# Patient Record
Sex: Male | Born: 1955 | Race: White | Marital: Married | Smoking: Former smoker
Health system: Southern US, Community
[De-identification: ages and names within clinical notes are randomized; demographics above are authoritative.]

## PROBLEM LIST (undated history)

## (undated) DIAGNOSIS — I25110 Atherosclerotic heart disease of native coronary artery with unstable angina pectoris: Secondary | ICD-10-CM

## (undated) DIAGNOSIS — E785 Hyperlipidemia, unspecified: Secondary | ICD-10-CM

## (undated) DIAGNOSIS — I480 Paroxysmal atrial fibrillation: Secondary | ICD-10-CM

## (undated) DIAGNOSIS — Z720 Tobacco use: Secondary | ICD-10-CM

## (undated) DIAGNOSIS — I5023 Acute on chronic systolic (congestive) heart failure: Secondary | ICD-10-CM

## (undated) DIAGNOSIS — R0600 Dyspnea, unspecified: Secondary | ICD-10-CM

## (undated) DIAGNOSIS — I509 Heart failure, unspecified: Secondary | ICD-10-CM

## (undated) DIAGNOSIS — I499 Cardiac arrhythmia, unspecified: Secondary | ICD-10-CM

## (undated) DIAGNOSIS — I209 Angina pectoris, unspecified: Secondary | ICD-10-CM

## (undated) DIAGNOSIS — I2510 Atherosclerotic heart disease of native coronary artery without angina pectoris: Secondary | ICD-10-CM

## (undated) DIAGNOSIS — E119 Type 2 diabetes mellitus without complications: Secondary | ICD-10-CM

## (undated) DIAGNOSIS — I255 Ischemic cardiomyopathy: Secondary | ICD-10-CM

## (undated) DIAGNOSIS — I219 Acute myocardial infarction, unspecified: Secondary | ICD-10-CM

---

## 2016-04-01 DIAGNOSIS — Z736 Limitation of activities due to disability: Secondary | ICD-10-CM

## 2017-03-29 ENCOUNTER — Encounter (HOSPITAL_COMMUNITY)
Admission: EM | Disposition: A | Discharge status: Home / Self Care | Payer: Self-pay | Source: Home / Self Care | Attending: Thoracic Surgery (Cardiothoracic Vascular Surgery)

## 2017-03-29 ENCOUNTER — Emergency Department (HOSPITAL_COMMUNITY): Payer: 59

## 2017-03-29 ENCOUNTER — Inpatient Hospital Stay (HOSPITAL_COMMUNITY)
Admission: EM | Admit: 2017-03-29 | Discharge: 2017-04-09 | DRG: 233 | Disposition: A | Discharge status: Home / Self Care | Payer: 59 | Attending: Thoracic Surgery (Cardiothoracic Vascular Surgery) | Admitting: Thoracic Surgery (Cardiothoracic Vascular Surgery)

## 2017-03-29 DIAGNOSIS — E119 Type 2 diabetes mellitus without complications: Secondary | ICD-10-CM

## 2017-03-29 DIAGNOSIS — I480 Paroxysmal atrial fibrillation: Secondary | ICD-10-CM | POA: Diagnosis present

## 2017-03-29 DIAGNOSIS — I214 Non-ST elevation (NSTEMI) myocardial infarction: Secondary | ICD-10-CM | POA: Diagnosis not present

## 2017-03-29 DIAGNOSIS — D62 Acute posthemorrhagic anemia: Secondary | ICD-10-CM | POA: Diagnosis not present

## 2017-03-29 DIAGNOSIS — Z951 Presence of aortocoronary bypass graft: Secondary | ICD-10-CM

## 2017-03-29 DIAGNOSIS — I447 Left bundle-branch block, unspecified: Secondary | ICD-10-CM | POA: Diagnosis present

## 2017-03-29 DIAGNOSIS — J9811 Atelectasis: Secondary | ICD-10-CM

## 2017-03-29 DIAGNOSIS — R7303 Prediabetes: Secondary | ICD-10-CM | POA: Diagnosis present

## 2017-03-29 DIAGNOSIS — I4891 Unspecified atrial fibrillation: Secondary | ICD-10-CM

## 2017-03-29 DIAGNOSIS — I5023 Acute on chronic systolic (congestive) heart failure: Secondary | ICD-10-CM | POA: Diagnosis present

## 2017-03-29 DIAGNOSIS — Z8249 Family history of ischemic heart disease and other diseases of the circulatory system: Secondary | ICD-10-CM

## 2017-03-29 DIAGNOSIS — D696 Thrombocytopenia, unspecified: Secondary | ICD-10-CM | POA: Diagnosis present

## 2017-03-29 DIAGNOSIS — Z6833 Body mass index (BMI) 33.0-33.9, adult: Secondary | ICD-10-CM

## 2017-03-29 DIAGNOSIS — R0602 Shortness of breath: Secondary | ICD-10-CM

## 2017-03-29 DIAGNOSIS — J449 Chronic obstructive pulmonary disease, unspecified: Secondary | ICD-10-CM | POA: Diagnosis present

## 2017-03-29 DIAGNOSIS — Z4682 Encounter for fitting and adjustment of non-vascular catheter: Secondary | ICD-10-CM

## 2017-03-29 DIAGNOSIS — R42 Dizziness and giddiness: Secondary | ICD-10-CM | POA: Diagnosis not present

## 2017-03-29 DIAGNOSIS — I509 Heart failure, unspecified: Secondary | ICD-10-CM

## 2017-03-29 DIAGNOSIS — R570 Cardiogenic shock: Secondary | ICD-10-CM | POA: Diagnosis present

## 2017-03-29 DIAGNOSIS — I25110 Atherosclerotic heart disease of native coronary artery with unstable angina pectoris: Secondary | ICD-10-CM | POA: Diagnosis present

## 2017-03-29 DIAGNOSIS — F17210 Nicotine dependence, cigarettes, uncomplicated: Secondary | ICD-10-CM | POA: Diagnosis present

## 2017-03-29 DIAGNOSIS — I959 Hypotension, unspecified: Secondary | ICD-10-CM | POA: Diagnosis present

## 2017-03-29 DIAGNOSIS — Z716 Tobacco abuse counseling: Secondary | ICD-10-CM

## 2017-03-29 DIAGNOSIS — J9601 Acute respiratory failure with hypoxia: Secondary | ICD-10-CM | POA: Diagnosis present

## 2017-03-29 DIAGNOSIS — E785 Hyperlipidemia, unspecified: Secondary | ICD-10-CM | POA: Diagnosis present

## 2017-03-29 DIAGNOSIS — I255 Ischemic cardiomyopathy: Secondary | ICD-10-CM | POA: Diagnosis present

## 2017-03-29 DIAGNOSIS — Z720 Tobacco use: Secondary | ICD-10-CM | POA: Diagnosis present

## 2017-03-29 DIAGNOSIS — E669 Obesity, unspecified: Secondary | ICD-10-CM | POA: Diagnosis present

## 2017-03-29 HISTORY — DX: Tobacco use: Z72.0

## 2017-03-29 HISTORY — PX: CORONARY/GRAFT ACUTE MI REVASCULARIZATION: CATH118305

## 2017-03-29 HISTORY — DX: Dyspnea, unspecified: R06.00

## 2017-03-29 HISTORY — DX: Paroxysmal atrial fibrillation: I48.0

## 2017-03-29 HISTORY — DX: Hyperlipidemia, unspecified: E78.5

## 2017-03-29 HISTORY — PX: IABP INSERTION: CATH118242

## 2017-03-29 HISTORY — DX: Ischemic cardiomyopathy: I25.5

## 2017-03-29 HISTORY — DX: Acute on chronic systolic (congestive) heart failure: I50.23

## 2017-03-29 HISTORY — DX: Acute myocardial infarction, unspecified: I21.9

## 2017-03-29 HISTORY — DX: Atherosclerotic heart disease of native coronary artery without angina pectoris: I25.10

## 2017-03-29 HISTORY — PX: LEFT HEART CATH AND CORONARY ANGIOGRAPHY: CATH118249

## 2017-03-29 HISTORY — DX: Type 2 diabetes mellitus without complications: E11.9

## 2017-03-29 HISTORY — DX: Atherosclerotic heart disease of native coronary artery with unstable angina pectoris: I25.110

## 2017-03-29 HISTORY — DX: Cardiac arrhythmia, unspecified: I49.9

## 2017-03-29 HISTORY — DX: Angina pectoris, unspecified: I20.9

## 2017-03-29 LAB — LIPID PANEL
Cholesterol: 277 mg/dL — ABNORMAL HIGH (ref 0–200)
HDL: 37 mg/dL — AB (ref >40)
LDL Cholesterol: 180 mg/dL — ABNORMAL HIGH (ref 0–99)
Total CHOL/HDL Ratio: 7.5 RATIO
Triglycerides: 299 mg/dL — ABNORMAL HIGH (ref <150)
VLDL: 60 mg/dL — ABNORMAL HIGH (ref 0–40)

## 2017-03-29 LAB — BASIC METABOLIC PANEL
Anion gap: 13 (ref 5–15)
BUN: 14 mg/dL (ref 6–20)
CO2: 22 mmol/L (ref 22–32)
CREATININE: 1.02 mg/dL (ref 0.61–1.24)
Calcium: 9.4 mg/dL (ref 8.9–10.3)
Chloride: 99 mmol/L — ABNORMAL LOW (ref 101–111)
GFR calc Af Amer: >60 mL/min (ref >60)
GFR calc non Af Amer: >60 mL/min (ref >60)
GLUCOSE: 258 mg/dL — AB (ref 65–99)
Potassium: 5.2 mmol/L — ABNORMAL HIGH (ref 3.5–5.1)
Sodium: 134 mmol/L — ABNORMAL LOW (ref 135–145)

## 2017-03-29 LAB — CBC WITH DIFFERENTIAL/PLATELET
Basophils Absolute: 0.1 10*3/uL (ref 0.0–0.1)
Basophils Relative: 0 %
Eosinophils Absolute: 0.1 10*3/uL (ref 0.0–0.7)
Eosinophils Relative: 1 %
HEMATOCRIT: 51.5 % (ref 39.0–52.0)
Hemoglobin: 17.8 g/dL — ABNORMAL HIGH (ref 13.0–17.0)
LYMPHS PCT: 17 %
Lymphs Abs: 2.8 10*3/uL (ref 0.7–4.0)
MCH: 33.6 pg (ref 26.0–34.0)
MCHC: 34.6 g/dL (ref 30.0–36.0)
MCV: 97.4 fL (ref 78.0–100.0)
MONO ABS: 0.6 10*3/uL (ref 0.1–1.0)
MONOS PCT: 3 %
NEUTROS ABS: 13.6 10*3/uL — AB (ref 1.7–7.7)
Neutrophils Relative %: 79 %
Platelets: 190 10*3/uL (ref 150–400)
RBC: 5.29 MIL/uL (ref 4.22–5.81)
RDW: 13.3 % (ref 11.5–15.5)
WBC: 17.1 10*3/uL — ABNORMAL HIGH (ref 4.0–10.5)

## 2017-03-29 LAB — I-STAT CHEM 8, ED
BUN: 18 mg/dL (ref 6–20)
CHLORIDE: 101 mmol/L (ref 101–111)
CREATININE: 0.9 mg/dL (ref 0.61–1.24)
Calcium, Ion: 1.13 mmol/L — ABNORMAL LOW (ref 1.15–1.40)
GLUCOSE: 268 mg/dL — AB (ref 65–99)
HCT: 55 % — ABNORMAL HIGH (ref 39.0–52.0)
Hemoglobin: 18.7 g/dL — ABNORMAL HIGH (ref 13.0–17.0)
POTASSIUM: 5.2 mmol/L — AB (ref 3.5–5.1)
Sodium: 138 mmol/L (ref 135–145)
TCO2: 26 mmol/L (ref 22–32)

## 2017-03-29 LAB — I-STAT TROPONIN, ED: Troponin i, poc: 1.43 ng/mL (ref 0.00–0.08)

## 2017-03-29 LAB — APTT: APTT: 29 s (ref 24–36)

## 2017-03-29 LAB — PROTIME-INR
INR: 1.03
PROTHROMBIN TIME: 13.4 s (ref 11.4–15.2)

## 2017-03-29 LAB — TROPONIN I: Troponin I: 1.35 ng/mL (ref <0.03)

## 2017-03-29 LAB — MAGNESIUM: MAGNESIUM: 2 mg/dL (ref 1.7–2.4)

## 2017-03-29 LAB — TSH: TSH: 4.454 u[IU]/mL (ref 0.350–4.500)

## 2017-03-29 SURGERY — CORONARY/GRAFT ACUTE MI REVASCULARIZATION
Anesthesia: LOCAL

## 2017-03-29 MED ORDER — HEPARIN (PORCINE) IN NACL 2-0.9 UNIT/ML-% IJ SOLN
INTRAMUSCULAR | Status: DC | PRN
  Administered 2017-03-29: 1000 mL

## 2017-03-29 MED ORDER — DILTIAZEM LOAD VIA INFUSION
20.0000 mg | Freq: Once | INTRAVENOUS | Status: AC
  Administered 2017-03-29: 20 mg via INTRAVENOUS
  Filled 2017-03-29: qty 20

## 2017-03-29 MED ORDER — LEVALBUTEROL HCL 0.63 MG/3ML IN NEBU
0.6300 mg | INHALATION_SOLUTION | Freq: Once | RESPIRATORY_TRACT | Status: AC
  Administered 2017-03-29: 0.63 mg via RESPIRATORY_TRACT
  Filled 2017-03-29: qty 3

## 2017-03-29 MED ORDER — VERAPAMIL HCL 2.5 MG/ML IV SOLN
INTRAVENOUS | Status: AC
  Filled 2017-03-29: qty 2

## 2017-03-29 MED ORDER — HEPARIN (PORCINE) IN NACL 100-0.45 UNIT/ML-% IJ SOLN
1400.0000 [IU]/h | INTRAMUSCULAR | Status: DC
  Administered 2017-03-29: 1400 [IU]/h via INTRAVENOUS
  Filled 2017-03-29: qty 250

## 2017-03-29 MED ORDER — NITROGLYCERIN IN D5W 200-5 MCG/ML-% IV SOLN
0.0000 ug/min | INTRAVENOUS | Status: DC
  Administered 2017-03-29: 5 ug/min via INTRAVENOUS
  Filled 2017-03-29: qty 250

## 2017-03-29 MED ORDER — ASPIRIN 81 MG PO CHEW
CHEWABLE_TABLET | ORAL | Status: AC
  Filled 2017-03-29: qty 3

## 2017-03-29 MED ORDER — IOPAMIDOL (ISOVUE-370) INJECTION 76%
INTRAVENOUS | Status: AC
  Filled 2017-03-29: qty 125

## 2017-03-29 MED ORDER — LIDOCAINE HCL (PF) 1 % IJ SOLN
INTRAMUSCULAR | Status: AC
  Filled 2017-03-29: qty 30

## 2017-03-29 MED ORDER — HEPARIN (PORCINE) IN NACL 2-0.9 UNIT/ML-% IJ SOLN
INTRAMUSCULAR | Status: AC
  Filled 2017-03-29: qty 1000

## 2017-03-29 MED ORDER — HEPARIN BOLUS VIA INFUSION
4500.0000 [IU] | Freq: Once | INTRAVENOUS | Status: AC
  Administered 2017-03-29: 4500 [IU] via INTRAVENOUS
  Filled 2017-03-29: qty 4500

## 2017-03-29 MED ORDER — MORPHINE SULFATE (PF) 4 MG/ML IV SOLN
4.0000 mg | Freq: Once | INTRAVENOUS | Status: AC
  Administered 2017-03-29: 4 mg via INTRAVENOUS
  Filled 2017-03-29: qty 1

## 2017-03-29 MED ORDER — HEPARIN SODIUM (PORCINE) 1000 UNIT/ML IJ SOLN
INTRAMUSCULAR | Status: AC
Start: 2017-03-29 — End: ?
  Filled 2017-03-29: qty 1

## 2017-03-29 MED ORDER — DILTIAZEM HCL-DEXTROSE 100-5 MG/100ML-% IV SOLN (PREMIX)
5.0000 mg/h | INTRAVENOUS | Status: DC
  Administered 2017-03-29: 5 mg/h via INTRAVENOUS
  Filled 2017-03-29: qty 100

## 2017-03-29 MED ORDER — SODIUM CHLORIDE 0.9 % IV SOLN
INTRAVENOUS | Status: DC
  Administered 2017-03-29: 20 mL via INTRAVENOUS

## 2017-03-29 SURGICAL SUPPLY — 14 items
BALLN IABP SENSA PLUS 7.5F 40C (BALLOONS) ×2
BALLOON IABP SENS PLUS 7.5F40C (BALLOONS) ×1 IMPLANT
CATH 5FR JL3.5 JR4 ANG PIG MP (CATHETERS) ×2 IMPLANT
CATH VISTA GUIDE 6FR XBLAD3.5 (CATHETERS) ×2 IMPLANT
DEVICE RAD COMP TR BAND LRG (VASCULAR PRODUCTS) ×2 IMPLANT
GLIDESHEATH SLEND SS 6F .021 (SHEATH) ×2 IMPLANT
GUIDEWIRE INQWIRE 1.5J.035X260 (WIRE) ×1 IMPLANT
INQWIRE 1.5J .035X260CM (WIRE) ×2
KIT ENCORE 26 ADVANTAGE (KITS) ×2 IMPLANT
KIT HEART LEFT (KITS) ×2 IMPLANT
PACK CARDIAC CATHETERIZATION (CUSTOM PROCEDURE TRAY) ×2 IMPLANT
SHEATH PINNACLE 6F 10CM (SHEATH) ×2 IMPLANT
TRANSDUCER W/STOPCOCK (MISCELLANEOUS) ×2 IMPLANT
TUBING CIL FLEX 10 FLL-RA (TUBING) ×2 IMPLANT

## 2017-03-29 NOTE — ED Triage Notes (Signed)
[facility] 8[facility]10 CP that started around 1830 tonight while pt is at work. Pt describes chest pressure that radiates down left arm. Pt endorses SOB, nausea, and diaphoresis. Given x2 nitro and 81 mg aspirin with EMS. Pt reports decrease in CP following first dose of nitro. Pt in uncontrolled afib, HR anywhere from 90-170. Pt endorses increased CP with exertion.

## 2017-03-29 NOTE — ED Notes (Signed)
Third EKG done.

## 2017-03-29 NOTE — ED Provider Notes (Signed)
[facility] Provider Note   CSN: [id number] Arrival date & time: 03/29/17  2123     History   Chief Complaint Chief Complaint  Patient presents with  . Chest Pain    HPI Ryan Durham is a 62 y.o. male.  Patient with no reported PMH presents to the ED with a chief complaint of chest pain.  He states that the symptoms started at 6:30 PM.  He reports associated SOB and nausea.  Reports dry cough.  He states that he has been feeling run down for the past several weeks.  States that he felt something like this about 3 years ago and took some TUMS and didn't investigate it further.  He has taken some ASA and nitro by EMS with minimal relief.  There are no other associated symptoms.   The history is provided by the patient. No language interpreter was used.    No past medical history on file.  There are no active problems to display for this patient.    Home Medications    Prior to Admission medications   Not on File    Family History No family history on file.  Social History Social History   Tobacco Use  . Smoking status: Not on file  Substance Use Topics  . Alcohol use: Not on file  . Drug use: Not on file     Allergies   Patient has no allergy information on record.   Review of Systems Review of Systems  All other systems reviewed and are negative.    Physical Exam Updated Vital Signs BP (!) 137/103   Pulse 78   Temp 98.1 F (36.7 C) (Oral)   Resp 17   SpO2 (!) 89%   Physical Exam  Constitutional: He is oriented to person, place, and time. He appears well-developed and well-nourished.  HENT:  Head: Normocephalic and atraumatic.  Eyes: Conjunctivae and EOM are normal. Pupils are equal, round, and reactive to light. Right eye exhibits no discharge. Left eye exhibits no discharge. No scleral icterus.  Neck: Normal range of motion. Neck supple. No JVD present.  Cardiovascular: Normal heart sounds. An  irregularly irregular rhythm present. Tachycardia present. Exam reveals no gallop and no friction rub.  No murmur heard. Pulmonary/Chest: Effort normal. No respiratory distress. He has wheezes (diffusely). He has no rales. He exhibits no tenderness.  Abdominal: Soft. He exhibits no distension and no mass. There is no tenderness. There is no rebound and no guarding.  Musculoskeletal: Normal range of motion. He exhibits no edema or tenderness.  Neurological: He is alert and oriented to person, place, and time.  Skin: Skin is warm and dry.  Psychiatric: He has a normal mood and affect. His behavior is normal. Judgment and thought content normal.  Nursing note and vitals reviewed.    ED Treatments / Results  Labs (all labs ordered are listed, but only abnormal results are displayed) Labs Reviewed  CBC WITH DIFFERENTIAL/PLATELET - Abnormal; Notable for the following components:      Result Value   WBC 17.1 (*)    Hemoglobin 17.8 (*)    Neutro Abs 13.6 (*)    All other components within normal limits  I-STAT TROPONIN, ED - Abnormal; Notable for the following components:   Troponin i, poc 1.43 (*)    All other components within normal limits  I-STAT CHEM 8, ED - Abnormal; Notable for the following components:   Potassium 5.2 (*)    Glucose, Bld  268 (*)    Calcium, Ion 1.13 (*)    Hemoglobin 18.7 (*)    HCT 55.0 (*)    All other components within normal limits  PROTIME-INR  APTT  BASIC METABOLIC PANEL  MAGNESIUM  TSH  LIPID PANEL  TROPONIN I  HEPARIN LEVEL (UNFRACTIONATED)    EKG  EKG Interpretation  Date/Time:  Sunday March 29 2017 21:30:44 EST Ventricular Rate:  132 PR Interval:    QRS Duration: 98 QT Interval:  324 QTC Calculation: 481 R Axis:   -62 Text Interpretation:  Atrial fibrillation Left anterior fascicular block LVH with secondary repolarization abnormality Lateral infarct, acute (LAD) Anterior infarct, old Confirmed by Rolland Porter ([id number]) on 03/29/2017 9:38:27  PM        Radiology No results found.  Procedures Procedures (including critical care time) CRITICAL CARE Performed by: Roxy Horseman   Total critical care time: 48 minutes  Critical care time was exclusive of separately billable procedures and treating other patients.  Critical care was necessary to treat or prevent imminent or life-threatening deterioration.  Critical care was time spent personally by me on the following activities: development of treatment plan with patient and/or surrogate as well as nursing, discussions with consultants, evaluation of patient's response to treatment, examination of patient, obtaining history from patient or surrogate, ordering and performing treatments and interventions, ordering and review of laboratory studies, ordering and review of radiographic studies, pulse oximetry and re-evaluation of patient's condition.  Medications Ordered in ED Medications  diltiazem (CARDIZEM) 1 mg/mL load via infusion 20 mg (20 mg Intravenous Bolus from Bag 03/29/17 2137)    And  diltiazem (CARDIZEM) 100 mg in dextrose 5% (1 mg/mL) infusion (5 mg/hr Intravenous New Bag/Given 03/29/17 2137)  levalbuterol (XOPENEX) nebulizer solution 0.63 mg (not administered)     Initial Impression / Assessment and Plan / ED Course  I have reviewed the triage vital signs and the nursing notes.  Pertinent labs & imaging results that were available during my care of the patient were reviewed by me and considered in my medical decision making (see chart for details).     Patient with chest pain that started tonight.  Has felt weak and run down for the past several weeks.  EKG shows afib with RVR.  New to patient, but reports he felt something like this a few years ago, but was never evaluated.   Not a candidate for cardioversion given unclear onset (feeling run down for weeks). Will start diltiazem drip.  Discussed with Dr. Fayrene Fearing, who agrees with plan for admission.  9:54  PM Notified of I-stat trop 1.43.  Will add heparin.  10:07 PM Rate controlled in the 80s; most recent BP 103/73.  Still having chest pain.  Will give morphine and hold nitro given soft BP.  Discussed with Dr. Fayrene Fearing, who recommends consultation with cards.  Discussed with on-call cardiologist, who will admit for NSTEMI.  Recommends adding nitro drip and titrating to BP and pain.  10:59 PM Notified that patient has decompensated.  Now satting 85% on 5L.  Will start BiPAP.  Concern for some element of heart failure vs pneumonia.  Discussed with Dr. Santiago Glad, who will admit, states patient may need to go to cath.  Agrees with plan to start BiPAP.  Final Clinical Impressions(s) / ED Diagnoses   Final diagnoses:  NSTEMI (non-ST elevated myocardial infarction) [facility])  New onset atrial fibrillation (HCC)  Atrial fibrillation with RVR [facility])    ED Discharge Orders  None       Roxy Horseman, [name] 03/29/17 2238    Roxy Horseman, [name] 03/29/17 2302    Rolland Porter, MD 03/29/17 [phone]

## 2017-03-29 NOTE — ED Notes (Signed)
Cath lab ready for pt. Resp on the way to help with transport.

## 2017-03-29 NOTE — ED Notes (Signed)
Breathing tx completed. Pt O2 sats began to drop. Remained in the low 80's. O2 at 3l applied via [place]. Sats remain in the low 90's-upper 80's. MD at bedside. Speaking with family and pt. Pt color looks very different than baseline (upon arrival). Has a dusky/gray appearance. Pt states that the pain in his chest is still a 5/10, even after the morphine. Lung sounds are diminished with increased exp. Wheezes. Resp callled to bedside. Md considering bi-pap.

## 2017-03-29 NOTE — ED Notes (Signed)
Resp at bedside. Applied bipap. Pt tolerating well.

## 2017-03-29 NOTE — ED Notes (Signed)
Dr. Leanne LovelyMackelheny to bedside.

## 2017-03-29 NOTE — Progress Notes (Signed)
ANTICOAGULATION CONSULT NOTE   Pharmacy Consult for heparin  Indication: atrial fibrillation  No Known Allergies  Patient Measurements: Heparin Dosing Weight: 90.7  Vital Signs: Temp: 98.1 F (36.7 C) (01/06 2127) Temp Source: Oral (01/06 2127) BP: 137/103 (01/06 2131) Pulse Rate: 91 (01/06 2142)  Labs: Recent Labs    03/29/17 2141  HGB 18.7*  HCT 55.0*  CREATININE 0.90    Medical History: No past medical history on file.  Assessment: 62 yo male to start heparin for new onset afib with CP. No known a/c PTA. CBC stable.   Goal of Therapy:  Heparin level 0.3-0.7 units/ml Monitor platelets by anticoagulation protocol: Yes   Plan:  1. Give 4500 units bolus x 1 2. Start heparin infusion at 1400 units/hr 3. Check anti-Xa level in 8 hours and daily while on heparin 4. Continue to monitor H&H and platelets  Sheron NightingaleJames A Shavaughn Seidl 03/29/2017,10:05 PM

## 2017-03-29 NOTE — ED Notes (Signed)
Cardiologist to bedside speaking with the family. Decided that pt needs to go to the cath lab tonight. Cath team paged. Family aware.

## 2017-03-29 NOTE — ED Notes (Signed)
Pt feeling better he states. Skin color better, back to baseline upon arrival. All questions from family answered. Pt understands procedure and has no other questions.

## 2017-03-29 NOTE — ED Notes (Signed)
Chaplin to bedside speaking with family

## 2017-03-29 NOTE — ED Notes (Signed)
Family to bedside.  All questions answered.

## 2017-03-29 NOTE — ED Notes (Signed)
Cardiology here to see pt.

## 2017-03-30 ENCOUNTER — Inpatient Hospital Stay (HOSPITAL_COMMUNITY): Payer: 59

## 2017-03-30 ENCOUNTER — Encounter (HOSPITAL_COMMUNITY): Payer: Self-pay | Admitting: Cardiovascular Disease

## 2017-03-30 ENCOUNTER — Other Ambulatory Visit: Payer: Self-pay

## 2017-03-30 DIAGNOSIS — Z6833 Body mass index (BMI) 33.0-33.9, adult: Secondary | ICD-10-CM | POA: Diagnosis not present

## 2017-03-30 DIAGNOSIS — I214 Non-ST elevation (NSTEMI) myocardial infarction: Secondary | ICD-10-CM

## 2017-03-30 DIAGNOSIS — E119 Type 2 diabetes mellitus without complications: Secondary | ICD-10-CM

## 2017-03-30 DIAGNOSIS — D62 Acute posthemorrhagic anemia: Secondary | ICD-10-CM | POA: Diagnosis not present

## 2017-03-30 DIAGNOSIS — R42 Dizziness and giddiness: Secondary | ICD-10-CM | POA: Diagnosis not present

## 2017-03-30 DIAGNOSIS — I2510 Atherosclerotic heart disease of native coronary artery without angina pectoris: Secondary | ICD-10-CM | POA: Diagnosis not present

## 2017-03-30 DIAGNOSIS — J9601 Acute respiratory failure with hypoxia: Secondary | ICD-10-CM | POA: Diagnosis present

## 2017-03-30 DIAGNOSIS — Z720 Tobacco use: Secondary | ICD-10-CM | POA: Diagnosis not present

## 2017-03-30 DIAGNOSIS — Z951 Presence of aortocoronary bypass graft: Secondary | ICD-10-CM | POA: Diagnosis not present

## 2017-03-30 DIAGNOSIS — R570 Cardiogenic shock: Secondary | ICD-10-CM | POA: Diagnosis present

## 2017-03-30 DIAGNOSIS — I255 Ischemic cardiomyopathy: Secondary | ICD-10-CM | POA: Diagnosis present

## 2017-03-30 DIAGNOSIS — I447 Left bundle-branch block, unspecified: Secondary | ICD-10-CM | POA: Diagnosis present

## 2017-03-30 DIAGNOSIS — I4891 Unspecified atrial fibrillation: Secondary | ICD-10-CM | POA: Diagnosis present

## 2017-03-30 DIAGNOSIS — Z716 Tobacco abuse counseling: Secondary | ICD-10-CM | POA: Diagnosis not present

## 2017-03-30 DIAGNOSIS — I959 Hypotension, unspecified: Secondary | ICD-10-CM | POA: Diagnosis present

## 2017-03-30 DIAGNOSIS — D696 Thrombocytopenia, unspecified: Secondary | ICD-10-CM | POA: Diagnosis present

## 2017-03-30 DIAGNOSIS — I480 Paroxysmal atrial fibrillation: Secondary | ICD-10-CM | POA: Diagnosis present

## 2017-03-30 DIAGNOSIS — I25110 Atherosclerotic heart disease of native coronary artery with unstable angina pectoris: Secondary | ICD-10-CM | POA: Diagnosis present

## 2017-03-30 DIAGNOSIS — E7800 Pure hypercholesterolemia, unspecified: Secondary | ICD-10-CM | POA: Diagnosis not present

## 2017-03-30 DIAGNOSIS — E785 Hyperlipidemia, unspecified: Secondary | ICD-10-CM | POA: Diagnosis present

## 2017-03-30 DIAGNOSIS — Z01810 Encounter for preprocedural cardiovascular examination: Secondary | ICD-10-CM

## 2017-03-30 DIAGNOSIS — J449 Chronic obstructive pulmonary disease, unspecified: Secondary | ICD-10-CM | POA: Diagnosis present

## 2017-03-30 DIAGNOSIS — F17210 Nicotine dependence, cigarettes, uncomplicated: Secondary | ICD-10-CM | POA: Diagnosis present

## 2017-03-30 DIAGNOSIS — I509 Heart failure, unspecified: Secondary | ICD-10-CM | POA: Diagnosis not present

## 2017-03-30 DIAGNOSIS — Z8249 Family history of ischemic heart disease and other diseases of the circulatory system: Secondary | ICD-10-CM | POA: Diagnosis not present

## 2017-03-30 DIAGNOSIS — I5023 Acute on chronic systolic (congestive) heart failure: Secondary | ICD-10-CM | POA: Diagnosis present

## 2017-03-30 DIAGNOSIS — E669 Obesity, unspecified: Secondary | ICD-10-CM | POA: Diagnosis present

## 2017-03-30 DIAGNOSIS — J9811 Atelectasis: Secondary | ICD-10-CM | POA: Diagnosis not present

## 2017-03-30 DIAGNOSIS — R7303 Prediabetes: Secondary | ICD-10-CM | POA: Diagnosis present

## 2017-03-30 HISTORY — PX: CORONARY ANGIOPLASTY: SHX604

## 2017-03-30 HISTORY — DX: Type 2 diabetes mellitus without complications: E11.9

## 2017-03-30 HISTORY — DX: Ischemic cardiomyopathy: I25.5

## 2017-03-30 LAB — BASIC METABOLIC PANEL
ANION GAP: 12 (ref 5–15)
BUN: 15 mg/dL (ref 6–20)
CHLORIDE: 102 mmol/L (ref 101–111)
CO2: 20 mmol/L — AB (ref 22–32)
Calcium: 9.4 mg/dL (ref 8.9–10.3)
Creatinine, Ser: 0.96 mg/dL (ref 0.61–1.24)
GFR calc non Af Amer: >60 mL/min (ref >60)
GLUCOSE: 284 mg/dL — AB (ref 65–99)
POTASSIUM: 5.2 mmol/L — AB (ref 3.5–5.1)
Sodium: 134 mmol/L — ABNORMAL LOW (ref 135–145)

## 2017-03-30 LAB — POCT I-STAT 3, ART BLOOD GAS (G3+)
BICARBONATE: 25.7 mmol/L (ref 20.0–28.0)
O2 Saturation: 100 %
Patient temperature: 98.6
TCO2: 27 mmol/L (ref 22–32)
pCO2 arterial: 43.6 mmHg (ref 32.0–48.0)
pH, Arterial: 7.378 (ref 7.350–7.450)
pO2, Arterial: 291 mmHg — ABNORMAL HIGH (ref 83.0–108.0)

## 2017-03-30 LAB — PREALBUMIN: PREALBUMIN: 23.9 mg/dL (ref 18–38)

## 2017-03-30 LAB — HEMOGLOBIN A1C
Hgb A1c MFr Bld: 6.2 % — ABNORMAL HIGH (ref 4.8–5.6)
MEAN PLASMA GLUCOSE: 131.24 mg/dL

## 2017-03-30 LAB — URINALYSIS, ROUTINE W REFLEX MICROSCOPIC
BILIRUBIN URINE: NEGATIVE
GLUCOSE, UA: NEGATIVE mg/dL
KETONES UR: NEGATIVE mg/dL
Nitrite: NEGATIVE
Protein, ur: 30 mg/dL — AB
Specific Gravity, Urine: 1.03 (ref 1.005–1.030)
pH: 5 (ref 5.0–8.0)

## 2017-03-30 LAB — CBC
HEMATOCRIT: 52.9 % — AB (ref 39.0–52.0)
HEMOGLOBIN: 18.3 g/dL — AB (ref 13.0–17.0)
MCH: 33.6 pg (ref 26.0–34.0)
MCHC: 34.6 g/dL (ref 30.0–36.0)
MCV: 97.2 fL (ref 78.0–100.0)
Platelets: 178 10*3/uL (ref 150–400)
RBC: 5.44 MIL/uL (ref 4.22–5.81)
RDW: 13.6 % (ref 11.5–15.5)
WBC: 16.2 10*3/uL — ABNORMAL HIGH (ref 4.0–10.5)

## 2017-03-30 LAB — GLUCOSE, CAPILLARY: Glucose-Capillary: 216 mg/dL — ABNORMAL HIGH (ref 65–99)

## 2017-03-30 LAB — PROTIME-INR
INR: 1.12
Prothrombin Time: 14.3 seconds (ref 11.4–15.2)

## 2017-03-30 LAB — COMPREHENSIVE METABOLIC PANEL
ALK PHOS: 85 U/L (ref 38–126)
ALT: 129 U/L — ABNORMAL HIGH (ref 17–63)
AST: 472 U/L — ABNORMAL HIGH (ref 15–41)
Albumin: 3.4 g/dL — ABNORMAL LOW (ref 3.5–5.0)
Anion gap: 12 (ref 5–15)
BILIRUBIN TOTAL: 1.3 mg/dL — AB (ref 0.3–1.2)
BUN: 17 mg/dL (ref 6–20)
CALCIUM: 8.8 mg/dL — AB (ref 8.9–10.3)
CO2: 22 mmol/L (ref 22–32)
Chloride: 102 mmol/L (ref 101–111)
Creatinine, Ser: 0.96 mg/dL (ref 0.61–1.24)
GFR calc non Af Amer: >60 mL/min (ref >60)
Glucose, Bld: 164 mg/dL — ABNORMAL HIGH (ref 65–99)
Potassium: 4.4 mmol/L (ref 3.5–5.1)
Sodium: 136 mmol/L (ref 135–145)
TOTAL PROTEIN: 7 g/dL (ref 6.5–8.1)

## 2017-03-30 LAB — ECHOCARDIOGRAM COMPLETE
Height: 69 in
Weight: 3541.47 oz

## 2017-03-30 LAB — TYPE AND SCREEN
ABO/RH(D): A POS
Antibody Screen: NEGATIVE

## 2017-03-30 LAB — HEPARIN LEVEL (UNFRACTIONATED)
Heparin Unfractionated: 0.72 IU/mL — ABNORMAL HIGH (ref 0.30–0.70)
Heparin Unfractionated: 0.31 IU/mL (ref 0.30–0.70)

## 2017-03-30 LAB — APTT: aPTT: 60 seconds — ABNORMAL HIGH (ref 24–36)

## 2017-03-30 LAB — BRAIN NATRIURETIC PEPTIDE: B Natriuretic Peptide: 617.3 pg/mL — ABNORMAL HIGH (ref 0.0–100.0)

## 2017-03-30 LAB — SURGICAL PCR SCREEN
MRSA, PCR: NEGATIVE
STAPHYLOCOCCUS AUREUS: NEGATIVE

## 2017-03-30 LAB — TROPONIN I: Troponin I: >65 ng/mL (ref <0.03)

## 2017-03-30 LAB — HIV ANTIBODY (ROUTINE TESTING W REFLEX): HIV Screen 4th Generation wRfx: NONREACTIVE

## 2017-03-30 LAB — ABO/RH: ABO/RH(D): A POS

## 2017-03-30 MED ORDER — VERAPAMIL HCL 2.5 MG/ML IV SOLN
INTRAVENOUS | Status: DC | PRN
  Administered 2017-03-30: 10 mL via INTRA_ARTERIAL

## 2017-03-30 MED ORDER — VANCOMYCIN HCL 10 G IV SOLR
1500.0000 mg | INTRAVENOUS | Status: AC
  Administered 2017-03-31: 1500 mg via INTRAVENOUS
  Filled 2017-03-30: qty 1500

## 2017-03-30 MED ORDER — SODIUM CHLORIDE 0.9 % IV SOLN
30.0000 ug/min | INTRAVENOUS | Status: AC
  Administered 2017-03-31: 25 ug/min via INTRAVENOUS
  Filled 2017-03-30: qty 20

## 2017-03-30 MED ORDER — FENTANYL CITRATE (PF) 100 MCG/2ML IJ SOLN
INTRAMUSCULAR | Status: AC
  Filled 2017-03-30: qty 2

## 2017-03-30 MED ORDER — HEPARIN (PORCINE) IN NACL 100-0.45 UNIT/ML-% IJ SOLN
1300.0000 [IU]/h | INTRAMUSCULAR | Status: DC
  Administered 2017-03-30: 1300 [IU]/h via INTRAVENOUS
  Administered 2017-03-30: 1400 [IU]/h via INTRAVENOUS
  Filled 2017-03-30: qty 250

## 2017-03-30 MED ORDER — FENTANYL CITRATE (PF) 100 MCG/2ML IJ SOLN
INTRAMUSCULAR | Status: DC | PRN
  Administered 2017-03-30: 50 ug via INTRAVENOUS

## 2017-03-30 MED ORDER — INSULIN REGULAR HUMAN 100 UNIT/ML IJ SOLN
INTRAMUSCULAR | Status: AC
  Administered 2017-03-31: 2 [IU]/h via INTRAVENOUS
  Filled 2017-03-30: qty 1

## 2017-03-30 MED ORDER — MILRINONE LACTATE IN DEXTROSE 20-5 MG/100ML-% IV SOLN: 0.1250 ug/kg/min | INTRAVENOUS | Status: DC

## 2017-03-30 MED ORDER — LEVALBUTEROL HCL 0.63 MG/3ML IN NEBU
0.6300 mg | INHALATION_SOLUTION | Freq: Four times a day (QID) | RESPIRATORY_TRACT | Status: DC | PRN
  Administered 2017-03-30: 0.63 mg via RESPIRATORY_TRACT
  Filled 2017-03-30: qty 3

## 2017-03-30 MED ORDER — ACETAMINOPHEN 325 MG PO TABS: 650.0000 mg | ORAL_TABLET | ORAL | Status: DC | PRN

## 2017-03-30 MED ORDER — ASPIRIN 81 MG PO CHEW: 81.0000 mg | CHEWABLE_TABLET | Freq: Every day | ORAL | Status: DC

## 2017-03-30 MED ORDER — CANGRELOR BOLUS VIA INFUSION: INTRAVENOUS | Status: DC | PRN

## 2017-03-30 MED ORDER — TRANEXAMIC ACID (OHS) BOLUS VIA INFUSION
15.0000 mg/kg | INTRAVENOUS | Status: AC
  Administered 2017-03-31: 1506 mg via INTRAVENOUS
  Filled 2017-03-30: qty 1506

## 2017-03-30 MED ORDER — EPINEPHRINE PF 1 MG/ML IJ SOLN
0.0000 ug/min | INTRAVENOUS | Status: DC
  Filled 2017-03-30: qty 4

## 2017-03-30 MED ORDER — METOPROLOL TARTRATE 12.5 MG HALF TABLET
12.5000 mg | ORAL_TABLET | Freq: Four times a day (QID) | ORAL | Status: DC
Start: 2017-03-30 — End: 2017-03-30
  Administered 2017-03-30 (×2): 12.5 mg via ORAL
  Filled 2017-03-30 (×2): qty 1

## 2017-03-30 MED ORDER — CANGRELOR BOLUS VIA INFUSION
INTRAVENOUS | Status: DC | PRN
  Administered 2017-03-30: 2859 ug via INTRAVENOUS

## 2017-03-30 MED ORDER — MAGNESIUM SULFATE 50 % IJ SOLN
40.0000 meq | INTRAMUSCULAR | Status: DC
  Filled 2017-03-30: qty 9.85

## 2017-03-30 MED ORDER — FUROSEMIDE 10 MG/ML IJ SOLN
80.0000 mg | Freq: Two times a day (BID) | INTRAMUSCULAR | Status: DC
  Administered 2017-03-30: 80 mg via INTRAVENOUS
  Filled 2017-03-30: qty 8

## 2017-03-30 MED ORDER — ONDANSETRON HCL 4 MG/2ML IJ SOLN: 4.0000 mg | Freq: Four times a day (QID) | INTRAMUSCULAR | Status: DC | PRN

## 2017-03-30 MED ORDER — SODIUM CHLORIDE 0.9 % IV SOLN
INTRAVENOUS | Status: DC | PRN
  Administered 2017-03-30: 0.75 ug/kg/min via INTRAVENOUS

## 2017-03-30 MED ORDER — SODIUM CHLORIDE 0.9% FLUSH: 3.0000 mL | Freq: Two times a day (BID) | INTRAVENOUS | Status: DC

## 2017-03-30 MED ORDER — CHLORHEXIDINE GLUCONATE 0.12 % MT SOLN
15.0000 mL | Freq: Two times a day (BID) | OROMUCOSAL | Status: DC
  Administered 2017-03-30 (×2): 15 mL via OROMUCOSAL
  Filled 2017-03-30: qty 15

## 2017-03-30 MED ORDER — ASPIRIN EC 81 MG PO TBEC: 81.0000 mg | DELAYED_RELEASE_TABLET | Freq: Every day | ORAL | Status: DC

## 2017-03-30 MED ORDER — SODIUM CHLORIDE 0.9 % IV SOLN: INTRAVENOUS | Status: DC

## 2017-03-30 MED ORDER — IOPAMIDOL (ISOVUE-370) INJECTION 76%
INTRAVENOUS | Status: DC | PRN
Start: 2017-03-30 — End: 2017-03-30
  Administered 2017-03-30: 75 mL via INTRA_ARTERIAL

## 2017-03-30 MED ORDER — LIDOCAINE-EPINEPHRINE 1 %-1:100000 IJ SOLN
INTRAMUSCULAR | Status: AC
  Filled 2017-03-30: qty 1

## 2017-03-30 MED ORDER — VANCOMYCIN HCL 1000 MG IV SOLR
INTRAVENOUS | Status: AC
  Administered 2017-03-31: 1000 mL
  Filled 2017-03-30: qty 1000

## 2017-03-30 MED ORDER — METOPROLOL TARTRATE 25 MG PO TABS
25.0000 mg | ORAL_TABLET | Freq: Four times a day (QID) | ORAL | Status: DC
  Administered 2017-03-30 – 2017-03-31 (×3): 25 mg via ORAL
  Filled 2017-03-30 (×4): qty 1

## 2017-03-30 MED ORDER — TRANEXAMIC ACID 1000 MG/10ML IV SOLN
1.5000 mg/kg/h | INTRAVENOUS | Status: AC
  Administered 2017-03-31: 1.5 mg/kg/h via INTRAVENOUS
  Filled 2017-03-30: qty 25

## 2017-03-30 MED ORDER — ATORVASTATIN CALCIUM 80 MG PO TABS
80.0000 mg | ORAL_TABLET | Freq: Every day | ORAL | Status: DC
  Administered 2017-03-30: 80 mg via ORAL
  Filled 2017-03-30: qty 1

## 2017-03-30 MED ORDER — SODIUM CHLORIDE 0.9 % IV SOLN
0.7500 ug/kg/min | INTRAVENOUS | Status: DC
  Administered 2017-03-30: 0.75 ug/kg/min via INTRAVENOUS
  Filled 2017-03-30: qty 50

## 2017-03-30 MED ORDER — CHLORHEXIDINE GLUCONATE CLOTH 2 % EX PADS
6.0000 | MEDICATED_PAD | Freq: Once | CUTANEOUS | Status: AC
  Administered 2017-03-30: 6 via TOPICAL

## 2017-03-30 MED ORDER — LIDOCAINE HCL (PF) 1 % IJ SOLN
INTRAMUSCULAR | Status: DC | PRN
  Administered 2017-03-30: 10 mL
  Administered 2017-03-30: 5 mL

## 2017-03-30 MED ORDER — CHLORHEXIDINE GLUCONATE CLOTH 2 % EX PADS
6.0000 | MEDICATED_PAD | Freq: Once | CUTANEOUS | Status: AC
  Administered 2017-03-31: 6 via TOPICAL

## 2017-03-30 MED ORDER — CHLORHEXIDINE GLUCONATE 0.12 % MT SOLN
15.0000 mL | Freq: Once | OROMUCOSAL | Status: AC
  Administered 2017-03-31: 15 mL via OROMUCOSAL

## 2017-03-30 MED ORDER — PLASMA-LYTE 148 IV SOLN
INTRAVENOUS | Status: AC
  Administered 2017-03-31: 500 mL
  Filled 2017-03-30: qty 2.5

## 2017-03-30 MED ORDER — SODIUM CHLORIDE 0.9% FLUSH: 3.0000 mL | INTRAVENOUS | Status: DC | PRN

## 2017-03-30 MED ORDER — NITROGLYCERIN IN D5W 200-5 MCG/ML-% IV SOLN
2.0000 ug/min | INTRAVENOUS | Status: DC
  Filled 2017-03-30: qty 250

## 2017-03-30 MED ORDER — NITROGLYCERIN 0.4 MG SL SUBL: 0.4000 mg | SUBLINGUAL_TABLET | SUBLINGUAL | Status: DC | PRN

## 2017-03-30 MED ORDER — METOPROLOL TARTRATE 12.5 MG HALF TABLET
12.5000 mg | ORAL_TABLET | Freq: Once | ORAL | Status: AC
  Administered 2017-03-31: 12.5 mg via ORAL
  Filled 2017-03-30: qty 1

## 2017-03-30 MED ORDER — HEPARIN (PORCINE) IN NACL 100-0.45 UNIT/ML-% IJ SOLN
INTRAMUSCULAR | Status: AC | PRN
  Administered 2017-03-30: 1469.045 [IU]/kg/h via INTRAVENOUS

## 2017-03-30 MED ORDER — DEXTROSE 5 % IV SOLN
750.0000 mg | INTRAVENOUS | Status: DC
  Filled 2017-03-30: qty 750

## 2017-03-30 MED ORDER — DEXTROSE 5 % IV SOLN
1.5000 g | INTRAVENOUS | Status: AC
  Administered 2017-03-31: .75 g via INTRAVENOUS
  Administered 2017-03-31: 1.5 g via INTRAVENOUS
  Filled 2017-03-30: qty 1.5

## 2017-03-30 MED ORDER — PERFLUTREN LIPID MICROSPHERE
INTRAVENOUS | Status: AC
  Administered 2017-03-30: 2 mL via INTRAVENOUS
  Filled 2017-03-30: qty 10

## 2017-03-30 MED ORDER — INSULIN ASPART 100 UNIT/ML ~~LOC~~ SOLN
0.0000 [IU] | SUBCUTANEOUS | Status: DC
  Administered 2017-03-30: 5 [IU] via SUBCUTANEOUS
  Administered 2017-03-31 (×2): 3 [IU] via SUBCUTANEOUS

## 2017-03-30 MED ORDER — SODIUM CHLORIDE 0.9 % IV SOLN: 250.0000 mL | INTRAVENOUS | Status: DC | PRN

## 2017-03-30 MED ORDER — POTASSIUM CHLORIDE 2 MEQ/ML IV SOLN
80.0000 meq | INTRAVENOUS | Status: DC
  Filled 2017-03-30: qty 40

## 2017-03-30 MED ORDER — FUROSEMIDE 10 MG/ML IJ SOLN
40.0000 mg | Freq: Two times a day (BID) | INTRAMUSCULAR | Status: DC
  Administered 2017-03-30 (×2): 40 mg via INTRAVENOUS
  Filled 2017-03-30 (×2): qty 4

## 2017-03-30 MED ORDER — SODIUM CHLORIDE 0.9 % IV SOLN
INTRAVENOUS | Status: DC
  Filled 2017-03-30: qty 30

## 2017-03-30 MED ORDER — HEPARIN SODIUM (PORCINE) 1000 UNIT/ML IJ SOLN
INTRAMUSCULAR | Status: DC | PRN
  Administered 2017-03-30: 10000 [IU] via INTRAVENOUS

## 2017-03-30 MED ORDER — SODIUM CHLORIDE 0.9 % IV SOLN: 4.0000 ug/kg/min | INTRAVENOUS | Status: DC

## 2017-03-30 MED ORDER — SODIUM CHLORIDE 0.9% FLUSH
3.0000 mL | Freq: Two times a day (BID) | INTRAVENOUS | Status: DC
  Administered 2017-03-30: 3 mL via INTRAVENOUS

## 2017-03-30 MED ORDER — PERFLUTREN LIPID MICROSPHERE
1.0000 mL | INTRAVENOUS | Status: AC | PRN
  Administered 2017-03-30: 2 mL via INTRAVENOUS
  Filled 2017-03-30: qty 10

## 2017-03-30 MED ORDER — DEXMEDETOMIDINE HCL IN NACL 400 MCG/100ML IV SOLN
0.1000 ug/kg/h | INTRAVENOUS | Status: AC
Start: 2017-03-31 — End: 2017-03-31
  Administered 2017-03-31: .3 ug/kg/h via INTRAVENOUS
  Filled 2017-03-30: qty 100

## 2017-03-30 MED ORDER — CANGRELOR TETRASODIUM 50 MG IV SOLR
INTRAVENOUS | Status: AC
  Filled 2017-03-30: qty 50

## 2017-03-30 MED ORDER — DOPAMINE-DEXTROSE 3.2-5 MG/ML-% IV SOLN
0.0000 ug/kg/min | INTRAVENOUS | Status: AC
Start: 2017-03-31 — End: 2017-03-31
  Administered 2017-03-31: 5 ug/kg/min via INTRAVENOUS
  Filled 2017-03-30: qty 250

## 2017-03-30 MED ORDER — ORAL CARE MOUTH RINSE
15.0000 mL | Freq: Two times a day (BID) | OROMUCOSAL | Status: DC
  Administered 2017-03-30 (×2): 15 mL via OROMUCOSAL

## 2017-03-30 MED ORDER — TRANEXAMIC ACID (OHS) PUMP PRIME SOLUTION
2.0000 mg/kg | INTRAVENOUS | Status: DC
  Filled 2017-03-30: qty 2.01

## 2017-03-30 MED ORDER — MILRINONE LACTATE IN DEXTROSE 20-5 MG/100ML-% IV SOLN
0.1250 ug/kg/min | INTRAVENOUS | Status: AC
  Administered 2017-03-31: .3 ug/kg/min via INTRAVENOUS
  Filled 2017-03-30: qty 100

## 2017-03-30 NOTE — Progress Notes (Signed)
Critical trop >55.00 called from lab.  Lab states this was from 1700hrs today.  Previous trop >65.00.  Pt remains on heparin.  3VD scheduled for CABG with Dr. Cornelius Moraswen in the AM.  Will continue to monitor for any changes and notify cardiology as needed overnight.  Vital signs stable, see below. IABP remains 1:1, 100% augmentation.  Date/Time  Temp  Pulse  ECG Heart Rate  Resp  BP  Patient Position (if appropriate)  SpO2  O2 Device  Weight Who  03/30/17 2000  100 F (37.8 C)  80  80  19  105/67 - 90 % -  - AJ  IABP: Mean BP 78 Assisted 88/53 Aug BP 113 Unassisted BP 98/58 Afterload reduction = 5 ///NOTHING FOLLOWS///ABJOHNS,RN///

## 2017-03-30 NOTE — Progress Notes (Signed)
Pt asleep on Bipap. I discussed sternal precautions, mobility, and d/c planning with wife. Gave her the [facility] booklet and careguide. She does not work so can be with him at d/c. Pts wife is wanting to quit smoking. We discussed briefly and I gave her resource information. Will continue to discuss with pt as well post op.  [id number] Ethelda ChickKristan Kaelan Emami CES, ACSM 11:04 AM 03/30/2017

## 2017-03-30 NOTE — Progress Notes (Signed)
Attempted to wean fio2 from 70 to 60%, however pt spo2 decreased to 88% with a good wave form. Pt placed back on 70%, RN aware. RT will continue to monitor.

## 2017-03-30 NOTE — H&P (Signed)
CARDIOLOGY H&P  HPI: Ryan Durham is a 62 y.o. male w/ no medical history (doesn't see doctors) presenting with chest pain.  Pain started suddenly, earlier in the day today while at work. 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 Feels like pressure, substernal, left precordial radiation, radiation to left arm. Short of breath associated with pain. Has had similar symptoms though never this severe. Very strong family history of CAD. Brother died from MI in his 5030's.   Patient smokes 1.5 packs of cigarettes per day for 30 years. Does not drink alcohol. No other drugs. No prescription medications. No exercise.   In ED, patient found to be in rapid AF, rates in the 170's. Chest pain worsening with rapid rates. Given IV diltiazem and converted to NSR with wide complex QRS in LBBB pattern. Patient's chest pain continued, became hypoxemic to the low 80's despite 6L oxygen by nasal cannula. Started on BiPAP. Systolic blood pressures dropped to 90's, chest pain continued. Initial troponin elevated to 1.3. Patient brought to cath lab for urgent angiography.   Review of Systems:     Cardiac Review of Systems: {Y] = yes [ ]  = no  Chest Pain [  X  ]  Resting SOB [ X  ] Exertional SOB  [ X ]  Orthopnea [  ]   Pedal Edema [   ]    Palpitations [ X ] Syncope  [  ]   Presyncope [   ]  General Review of Systems: [Y] = yes [  ]=no Constitional: recent weight change [  ]; anorexia [  ]; fatigue [  ]; nausea [  ]; night sweats [  ]; fever [  ]; or chills [  ];                                                                     Dental: poor dentition[  ];   Eye : blurred vision [  ]; diplopia [   ]; vision changes [  ];  Amaurosis fugax[  ]; Resp: cough [  ];  wheezing[  ];  hemoptysis[  ]; shortness of breath[  ]; paroxysmal nocturnal dyspnea[  ]; dyspnea on exertion[  ]; or orthopnea[  ];  GI:  gallstones[  ], vomiting[  ];  dysphagia[  ]; melena[  ];  hematochezia [  ]; heartburn[  ];   GU: kidney  stones [  ]; hematuria[  ];   dysuria [  ];  nocturia[  ];               Skin: rash [  ], swelling[  ];, hair loss[  ];  peripheral edema[  ];  or itching[  ]; Musculosketetal: myalgias[  ];  joint swelling[  ];  joint erythema[  ];  joint pain[  ];  back pain[  ];  Heme/Lymph: bruising[  ];  bleeding[  ];  anemia[  ];  Neuro: TIA[  ];  headaches[  ];  stroke[  ];  vertigo[  ];  seizures[  ];   paresthesias[  ];  difficulty walking[  ];  Psych:depression[  ]; anxiety[  ];  Endocrine: diabetes[  ];  thyroid dysfunction[  ];  Other:  No past  medical history on file.  Prior to Admission medications   Not on File    No Known Allergies  Social History   Socioeconomic History  . Marital status: Married    Spouse name: Not on file  . Number of children: Not on file  . Years of education: Not on file  . Highest education level: Not on file  Social Needs  . Financial resource strain: Not on file  . Food insecurity - worry: Not on file  . Food insecurity - inability: Not on file  . Transportation needs - medical: Not on file  . Transportation needs - non-medical: Not on file  Occupational History  . Not on file  Tobacco Use  . Smoking status: Not on file  Substance and Sexual Activity  . Alcohol use: Not on file  . Drug use: Not on file  . Sexual activity: Not on file  Other Topics Concern  . Not on file  Social History Narrative  . Not on file    Family history: - brother died from MI in his 56's   PHYSICAL EXAM: Vitals:   03/30/17 0115 03/30/17 0120  BP:    Pulse: 82 (!) 0  Resp: (!) 9 14  Temp:    SpO2: (!) 0% 96%   General:  Uncomfortable in bed, tachypneic HEENT: normal Neck: JVP elevated to >12 cm H2O Cor: tachycardic, +S4, no appreciable murmurs Lungs: diffuse course crackles bilaterally, increased work of breathing Abdomen: soft, nontender, nondistended. No hepatosplenomegaly. No bruits or masses. Good bowel sounds. Extremities: no cyanosis, clubbing,  rash, no edema Neuro: alert & oriented x 3, cranial nerves grossly intact. moves all 4 extremities w/o difficulty. Affect pleasant.  ECG:  Results for orders placed or performed during the hospital encounter of 03/29/17 (from the past 24 hour(s))  CBC with Differential/Platelet     Status: Abnormal   Collection Time: 03/29/17  9:25 PM  Result Value Ref Range   WBC 17.1 (H) 4.0 - 10.5 K/uL   RBC 5.29 4.22 - 5.81 MIL/uL   Hemoglobin 17.8 (H) 13.0 - 17.0 g/dL   HCT 16.1 09.6 - 04.5 %   MCV 97.4 78.0 - 100.0 fL   MCH 33.6 26.0 - 34.0 pg   MCHC 34.6 30.0 - 36.0 g/dL   RDW 40.9 81.1 - 91.4 %   Platelets 190 150 - 400 K/uL   Neutrophils Relative % 79 %   Neutro Abs 13.6 (H) 1.7 - 7.7 K/uL   Lymphocytes Relative 17 %   Lymphs Abs 2.8 0.7 - 4.0 K/uL   Monocytes Relative 3 %   Monocytes Absolute 0.6 0.1 - 1.0 K/uL   Eosinophils Relative 1 %   Eosinophils Absolute 0.1 0.0 - 0.7 K/uL   Basophils Relative 0 %   Basophils Absolute 0.1 0.0 - 0.1 K/uL  Basic metabolic panel     Status: Abnormal   Collection Time: 03/29/17  9:25 PM  Result Value Ref Range   Sodium 134 (L) 135 - 145 mmol/L   Potassium 5.2 (H) 3.5 - 5.1 mmol/L   Chloride 99 (L) 101 - 111 mmol/L   CO2 22 22 - 32 mmol/L   Glucose, Bld 258 (H) 65 - 99 mg/dL   BUN 14 6 - 20 mg/dL   Creatinine, Ser 7.82 0.61 - 1.24 mg/dL   Calcium 9.4 8.9 - 95.6 mg/dL   GFR calc non Af Amer >60 >60 mL/min   GFR calc Af Amer >60 >60 mL/min   Anion  gap 13 5 - 15  Magnesium     Status: None   Collection Time: 03/29/17  9:25 PM  Result Value Ref Range   Magnesium 2.0 1.7 - 2.4 mg/dL  TSH     Status: None   Collection Time: 03/29/17  9:35 PM  Result Value Ref Range   TSH 4.454 0.350 - 4.500 uIU/mL  I-stat troponin, ED     Status: Abnormal   Collection Time: 03/29/17  9:40 PM  Result Value Ref Range   Troponin i, poc 1.43 (HH) 0.00 - 0.08 ng/mL   Comment NOTIFIED PHYSICIAN    Comment 3          I-stat chem 8, ed     Status: Abnormal    Collection Time: 03/29/17  9:41 PM  Result Value Ref Range   Sodium 138 135 - 145 mmol/L   Potassium 5.2 (H) 3.5 - 5.1 mmol/L   Chloride 101 101 - 111 mmol/L   BUN 18 6 - 20 mg/dL   Creatinine, Ser 1.61 0.61 - 1.24 mg/dL   Glucose, Bld 096 (H) 65 - 99 mg/dL   Calcium, Ion 0.45 (L) 1.15 - 1.40 mmol/L   TCO2 26 22 - 32 mmol/L   Hemoglobin 18.7 (H) 13.0 - 17.0 g/dL   HCT 40.9 (H) 81.1 - 91.4 %  Protime-INR     Status: None   Collection Time: 03/29/17  9:53 PM  Result Value Ref Range   Prothrombin Time 13.4 11.4 - 15.2 seconds   INR 1.03   APTT     Status: None   Collection Time: 03/29/17  9:53 PM  Result Value Ref Range   aPTT 29 24 - 36 seconds  Lipid panel     Status: Abnormal   Collection Time: 03/29/17  9:53 PM  Result Value Ref Range   Cholesterol 277 (H) 0 - 200 mg/dL   Triglycerides 782 (H) <150 mg/dL   HDL 37 (L) >95 mg/dL   Total CHOL/HDL Ratio 7.5 RATIO   VLDL 60 (H) 0 - 40 mg/dL   LDL Cholesterol 621 (H) 0 - 99 mg/dL  Troponin I     Status: Abnormal   Collection Time: 03/29/17  9:53 PM  Result Value Ref Range   Troponin I 1.35 (HH) <0.03 ng/mL   Dg Chest Port 1 View  Result Date: 03/29/2017 CLINICAL DATA:  Chest pain and shortness of breath. EXAM: PORTABLE CHEST 1 VIEW COMPARISON:  None. FINDINGS: Mild cardiomegaly. Diffuse bronchial and interstitial thickening. Bilateral Kerley B-lines consistent pulmonary edema. No large pleural effusion. No pneumothorax. No confluent airspace disease. No acute osseous abnormalities are seen. IMPRESSION: Cardiomegaly. Kerley B-lines consistent with pulmonary edema. Bronchial and interstitial thickening may be due to pulmonary edema or bronchial inflammation. Electronically Signed   By: Rubye Oaks M.D.   On: 03/29/2017 23:12   Coronary angiogram 03/30/17:  Prox RCA lesion is 80% stenosed.  Prox RCA to Dist RCA lesion is 30% stenosed.  Ost RPDA lesion is 99% stenosed.  Post Atrio lesion is 80% stenosed.  Dist LM to Ost LAD  lesion is 100% stenosed.  Ost 1st Mrg lesion is 100% stenosed.  Ost Cx to Prox Cx lesion is 99% stenosed.  Ost 2nd Mrg to 2nd Mrg lesion is 70% stenosed.   1. Severe triple vessel CAD  2.  The LAD is not visualized on injections of the left system. The proximal LAD appears to be chronically occluded. The distal LAD and a diagonal branch fills from  right to left collaterals.  3. The Circumflex has a 99% proximal stenosis at the takeoff of a moderate caliber OM branch which fills slowly. The second OM branch has a moderately severe stenosis.  4. The RCA is a large dominant vessel with high grade disease in the proximal vessel, PDA and PLA.  5. Elevated filling pressures.  6. Successful placement of an IABP  Recommendations: He will be admitted to [facility] ICU. IABP set at 1:1. Chest pain resolved with placement of IABP. Will continue IV heparin with balloon pump in place. Will continue Cangrelor drip for now and consult CT surgery for bypass in the am. I will give IV Lasix now to better optimize his fluid status in preparation for bypass.   ASSESSMENT: Ryan Durham is a 62 y.o. male w/ no personal medical history (doesn't see doctors) but strong family history of severe CAD presenting with chest pain, found to have severe multivessel CAD and elevated LVEDP (~33) by angiography. Now transferred to [facility] w/ IABP in place.   PLAN/DISCUSSION: #) NSTEMI - repeat troponin in AM - CT surgery eval in AM - TTE in AM - NPO except meds pending surgical workup - check lipids, A1c - ASA 324mg  then 81mg  daily - heparin drip for IABP per pharmacy protocol - atorvastatin 80mg  QHS - defer ACE pending CABG workup - nitro gtt PRN - cangrelor gtt as bridge to CABG given high risk multivessel disease  #) Hypoxemic respiratory failure:  - Bipap overnight - lasix 40mg  IV BID for goal I/O -2L in 24 hours  Rosario Jacks, MD Cardiology Fellow, PGY-5

## 2017-03-30 NOTE — Progress Notes (Signed)
Pt taken off bipap and placed on 8L HFNC at this time. RN at bedside. RT will continue to closely monitor for need to go back on bipap.

## 2017-03-30 NOTE — Care Management Note (Signed)
Case Management Note Donn PieriniKristi Kwabena Strutz RN, BSN Unit 4E-Case Manager-- 2H coverage [phone]  Patient Details  Name: Ryan Durham MRN: [id number] Date of Birth: 1956/02/01  Subjective/Objective:  Pt admitted with NSTEMI, s/p cath with 3VD found- CVTS consulted for possible CABG- ?OR in the AM 03/31/17                  Action/Plan: PTA pt lived at home with spouse- who will be available to assist when pt returns home- CM to follow for transition needs   Expected Discharge Date:                  Expected Discharge Plan:  Home/Self Care  In-House Referral:     Discharge planning Services  CM Consult  Post Acute Care Choice:    Choice offered to:     DME Arranged:    DME Agency:     HH Arranged:    HH Agency:     Status of Service:  In process, will continue to follow  If discussed at Long Length of Stay Meetings, dates discussed:    Discharge Disposition:   Additional Comments:  Darrold SpanWebster, Liat Mayol Hall, RN 03/30/2017, 11:55 AM

## 2017-03-30 NOTE — Progress Notes (Signed)
ANTICOAGULATION CONSULT NOTE   Pharmacy Consult for heparin  Indication: atrial fibrillation/3v CAD/IABP  No Known Allergies  Patient Measurements: Heparin Dosing Weight: 90.7  Vital Signs: Temp: 98.1 F (36.7 C) (01/06 2127) Temp Source: Oral (01/06 2127) BP: 140/77 (01/07 0110) Pulse Rate: 0 (01/07 0120)  Labs: Recent Labs    03/29/17 2125 03/29/17 2141 03/29/17 2153  HGB 17.8* 18.7*  --   HCT 51.5 55.0*  --   PLT 190  --   --   APTT  --   --  29  LABPROT  --   --  13.4  INR  --   --  1.03  CREATININE 1.02 0.90  --   TROPONINI  --   --  1.35*    Assessment: 62 y.o. male with ACS s/p emergent cath, found to have severe 3v CAD, IABP inserted and cangrelor bridge started while awaiting CABG, for heparin  Goal of Therapy:  Heparin level 0.3-0.5 Monitor platelets by anticoagulation protocol: Yes   Plan:  Heparin 1400 units/hr resumed in cath lab Check heparin level in 6 hours.   Ryan Durham, Ryan Durham 03/30/2017,1:33 AM

## 2017-03-30 NOTE — Consult Note (Addendum)
[address]       [place] [address]             [phone]        Ryan Durham [facility] Medical Record #[id number] Date of Birth: Apr 15, 1955  Referring: Ryan Durham Primary Care: Patient, No Pcp Per Primary Cardiologist:No primary care provider on file.  Chief Complaint:    Chief Complaint  Patient presents with  . Chest Pain    History of Present Illness:      Ryan Durham is a 62 yo obese white male, who appears older than stated age.  He denies any previous medical problems, however he does not go to the doctor.  He is a smoker of 1ppd for at least 30 years.  He does have positive family history of CAD with a brother died from an MI in his 76s.  The patient presented to the ED last night with complaints of chest pain and associated shortness of breath.  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  He described the pain as pressure with radiation in his left arm.  He admits to similar symptoms before, but he never presented for evaluation.  In the ED, EKG revealed patient to be in rapid Atrial Fibrillation.  He was treated with IV Cardizem with successful conversion to NSR.  Follow EKG showed NSR with wide complex QRS and a LBBB.  Troponin levels were elevated.  He continued to have chest pain made worse with elevated HR.  He became hypoxic in the 80s despite 6L oxygen.  He was started on BPIP and taken emergently to the cardiac catheterization lab.  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  Patient was chest pain free post procedure.  He was placed on Heparin and transferred to the SICU.  Currently the patient remains on BIPAP.  He denies chest pain, but is very sleepy and doesn't participate much in evaluation.  His wife is at bedside.  The patient does admitted to trauma to his right leg.  He states he suffered an chain saw injury in his thigh area, and he also has a scar from a previous  laceration in his RLE.     Current Activity/ Functional Status: Patient is independent with mobility/ambulation, transfers, ADL's, IADL's.   Zubrod Score: At the time of surgery this patient's most appropriate activity status/level should be described as: []     0    Normal activity, no symptoms [x]     1    Restricted in physical strenuous activity but ambulatory, able to do out light work []     2    Ambulatory and capable of self care, unable to do work activities, up and about                 more than 50%  Of the time                            []     3    Only limited self care, in bed greater than 50% of waking hours []     4    Completely disabled, no self care, confined to bed or chair []     5    Moribund  History reviewed. No pertinent past medical history.  Past Surgical History:  Procedure Laterality Date  . CORONARY/GRAFT ACUTE MI REVASCULARIZATION N/A  03/29/2017   Procedure: Coronary/Graft Acute MI Revascularization;  Surgeon: Kathleene Hazel, MD;  Location: [facility] INVASIVE CV LAB;  Service: Cardiovascular;  Laterality: N/A;  . IABP INSERTION N/A 03/29/2017   Procedure: IABP Insertion;  Surgeon: Kathleene Hazel, MD;  Location: [facility] INVASIVE CV LAB;  Service: Cardiovascular;  Laterality: N/A;  . LEFT HEART CATH AND CORONARY ANGIOGRAPHY N/A 03/29/2017   Procedure: LEFT HEART CATH AND CORONARY ANGIOGRAPHY;  Surgeon: Kathleene Hazel, MD;  Location: [facility] INVASIVE CV LAB;  Service: Cardiovascular;  Laterality: N/A;    Social History   Tobacco Use  Smoking Status Current Every Day Smoker    Social History   Substance and Sexual Activity  Alcohol Use Not on file    Social History   Socioeconomic History  . Marital status: Married    Spouse name: Not on file  . Number of children: Not on file  . Years of education: Not on file  . Highest education level: Not on file  Social Needs  . Financial resource strain: Not on file  . Food insecurity - worry: Not on file   . Food insecurity - inability: Not on file  . Transportation needs - medical: Not on file  . Transportation needs - non-medical: Not on file  Occupational History  . Not on file  Tobacco Use  . Smoking status: Current Every Day Smoker  Substance and Sexual Activity  . Alcohol use: Not on file  . Drug use: Not on file  . Sexual activity: Not on file  Other Topics Concern  . Not on file  Social History Narrative  . Not on file    No Known Allergies  Current Facility-Administered Medications  Medication Dose Route Frequency Provider Last Rate Last Dose  . PERFLUTREN LIPID MICROSPHERE injection SUSP           . 0.9 %  sodium chloride infusion  250 mL Intravenous PRN Kathleene Hazel, MD      . 0.9 %  sodium chloride infusion  250 mL Intravenous PRN Carnicelli, Felipe Drone, MD      . acetaminophen (TYLENOL) tablet 650 mg  650 mg Oral Q4H PRN Rosario Jacks, MD      . Melene Muller ON 03/31/2017] aspirin EC tablet 81 mg  81 mg Oral Daily Carnicelli, Felipe Drone, MD      . atorvastatin (LIPITOR) tablet 80 mg  80 mg Oral q1800 Kathleene Hazel, MD      . cangrelor [facility]) 50 mg in sodium chloride 0.9 % 250 mL (0.2 mg/mL) infusion  0.75 mcg/kg/min Intravenous Continuous Kathleene Hazel, MD 21.4 mL/hr at 03/30/17 0156 0.75 mcg/kg/min at 03/30/17 0156  . chlorhexidine (PERIDEX) 0.12 % solution 15 mL  15 mL Mouth Rinse BID Verne Carrow D, MD   15 mL at 03/30/17 0800  . furosemide (LASIX) injection 40 mg  40 mg Intravenous BID Kathleene Hazel, MD   40 mg at 03/30/17 0759  . heparin ADULT infusion 100 units/mL (25000 units/259mL sodium chloride 0.45%)  1,300 Units/hr Intravenous Continuous Sampson Si, RPH 13 mL/hr at 03/30/17 0914 1,300 Units/hr at 03/30/17 0914  . MEDLINE mouth rinse  15 mL Mouth Rinse q12n4p Kathleene Hazel, MD      . metoprolol tartrate (LOPRESSOR) tablet 25 mg  25 mg Oral Q6H Chilton Si, MD      .  nitroGLYCERIN (NITROSTAT) SL tablet 0.4 mg  0.4 mg Sublingual Q5 Min x 3 PRN Carnicelli, Felipe Drone, MD      .  ondansetron (ZOFRAN) injection 4 mg  4 mg Intravenous Q6H PRN Carnicelli, Felipe Drone, MD      . sodium chloride flush (NS) 0.9 % injection 3 mL  3 mL Intravenous Q12H Verne Carrow D, MD      . sodium chloride flush (NS) 0.9 % injection 3 mL  3 mL Intravenous PRN Kathleene Hazel, MD      . sodium chloride flush (NS) 0.9 % injection 3 mL  3 mL Intravenous Q12H Carnicelli, Felipe Drone, MD   3 mL at 03/30/17 0207  . sodium chloride flush (NS) 0.9 % injection 3 mL  3 mL Intravenous PRN Carnicelli, Felipe Drone, MD        No medications prior to admission.    History reviewed. No pertinent family history. Review of Systems:   ROS Constitutional: negative Respiratory: positive for dyspnea on exertion Cardiovascular: positive for exertional chest pressure/discomfort and palpitations Gastrointestinal: negative Musculoskeletal:history of chain saw injury and laceration to RLE Neurological: negative     Cardiac Review of Systems: Y or  [    ]= no  Chest Pain [ y   ]  Resting SOB [   ] Exertional SOB  [ y ]  Orthopnea [  ]   Pedal Edema [ N  ]    Palpitations [ Y ] Syncope  [  ]   Presyncope [   ]  General Review of Systems: [Y] = yes [  ]=no Constitional: recent weight change [  ]; anorexia [  ]; fatigue [  ]; nausea [  ]; night sweats [  ]; fever [  ]; or chills [  ]                                                               Dental: poor dentition[  ]; Last Dentist visit:   Eye : blurred vision [  ]; diplopia [   ]; vision changes [  ];  Amaurosis fugax[  ]; Resp: cough [  ];  wheezing[  ];  hemoptysis[  ]; shortness of breath[Y  ]; paroxysmal nocturnal dyspnea[  ]; dyspnea on exertion[ Y ]; or orthopnea[  ];  GI:  gallstones[  ], vomiting[  ];  dysphagia[  ]; melena[  ];  hematochezia [  ]; heartburn[  ];   Hx of  Colonoscopy[  ]; GU: kidney stones [   ]; hematuria[  ];   dysuria [  ];  nocturia[  ];  history of     obstruction [  ]; urinary frequency [  ]             Skin: rash, swelling[  ];, hair loss[  ];  peripheral edema[  ];  or itching[  ]; Musculosketetal: myalgias[  ];  joint swelling[  ];  joint erythema[  ];  joint pain[  ];  back pain[  ];  Heme/Lymph: bruising[  ];  bleeding[  ];  anemia[  ];  Neuro: TIA[  ];  headaches[  ];  stroke[  ];  vertigo[  ];  seizures[  ];   paresthesias[  ];  difficulty walking[  ];  Psych:depression[  ]; anxiety[  ];  Endocrine: diabetes[  ];  thyroid dysfunction[  ];  Immunizations: Flu [  ];  Pneumococcal[  ];    Physical Exam: BP 106/71   Pulse 77   Temp 98.4 F (36.9 C)   Resp 14   Ht 5\' 9"  (1.753 m)   Wt 221 lb 5.5 oz (100.4 kg)   SpO2 94%   BMI 32.69 kg/m   General appearance: sleepy, arousable at times, doesn't really participate in exam Head: Normocephalic, without obvious abnormality, atraumatic Neck: no adenopathy, no carotid bruit, no JVD, supple, symmetrical, trachea midline and thyroid not enlarged, symmetric, no tenderness/mass/nodules Resp: clear to auscultation bilaterally Cardio: regular rate and rhythm GI: soft, non-tender; bowel sounds normal; no masses,  no organomegaly Extremities: scar to right thigh area and RLE Neurologic: Grossly normal  Diagnostic Studies & Laboratory data:  Cardiac catheterization:   Prox RCA lesion is 80% stenosed.  Prox RCA to Dist RCA lesion is 30% stenosed.  Ost RPDA lesion is 99% stenosed.  Post Atrio lesion is 80% stenosed.  Dist LM to Ost LAD lesion is 100% stenosed.  Ost 1st Mrg lesion is 100% stenosed.  Ost Cx to Prox Cx lesion is 99% stenosed.  Ost 2nd Mrg to 2nd Mrg lesion is 70% stenosed.   1. Severe triple vessel CAD  2.  The LAD is not visualized on injections of the left system. The proximal LAD appears to be chronically occluded. The distal LAD and a diagonal branch fills from right to left collaterals.  3.  The Circumflex has a 99% proximal stenosis at the takeoff of a moderate caliber OM branch which fills slowly. The second OM branch has a moderately severe stenosis.  4. The RCA is a large dominant vessel with high grade disease in the proximal vessel, PDA and PLA.  5. Elevated filling pressures.  6. Successful placement of an IABP     Recent Radiology Findings:   Dg Chest 1 View  Result Date: 03/30/2017 CLINICAL DATA:  Shortness of breath. Status post intra aortic balloon pump placement. EXAM: CHEST 1 VIEW COMPARISON:  Portable chest x-ray of March 29, 2016 FINDINGS: The intra-aortic balloon pump marker projects over the midportion of the aortic arch. The lungs are adequately inflated. The interstitial markings are less prominent today. The cardiac silhouette remains mildly enlarged. The pulmonary vascularity is less engorged and more distinct. The observed bony thorax exhibits no acute abnormality. IMPRESSION: Decreased pulmonary interstitial edema and pulmonary vascular congestion since intra aortic balloon pump placement. The radiodense tip projects over the midportion of the silhouette of the aortic arch. Electronically Signed   By: David  SwazilandJordan M.D.   On: 03/30/2017 08:56   Dg Chest Port 1 View  Result Date: 03/29/2017 CLINICAL DATA:  Chest pain and shortness of breath. EXAM: PORTABLE CHEST 1 VIEW COMPARISON:  None. FINDINGS: Mild cardiomegaly. Diffuse bronchial and interstitial thickening. Bilateral Kerley B-lines consistent pulmonary edema. No large pleural effusion. No pneumothorax. No confluent airspace disease. No acute osseous abnormalities are seen. IMPRESSION: Cardiomegaly. Kerley B-lines consistent with pulmonary edema. Bronchial and interstitial thickening may be due to pulmonary edema or bronchial inflammation. Electronically Signed   By: Rubye OaksMelanie  Ehinger M.D.   On: 03/29/2017 23:12     I have independently reviewed the above radiologic studies.  Recent Lab Findings: Lab Results    Component Value Date   WBC 16.2 (H) 03/30/2017   HGB 18.3 (H) 03/30/2017   HCT 52.9 (H) 03/30/2017   PLT 178 03/30/2017   GLUCOSE 284 (H) 03/30/2017   CHOL 277 (H) 03/29/2017   TRIG 299 (H) 03/29/2017  HDL 37 (L) 03/29/2017   LDLCALC 180 (H) 03/29/2017   NA 134 (L) 03/30/2017   K 5.2 (H) 03/30/2017   CL 102 03/30/2017   CREATININE 0.96 03/30/2017   BUN 15 03/30/2017   CO2 20 (L) 03/30/2017   TSH 4.454 03/29/2017   INR 1.03 03/29/2017    Assessment / Plan:      1. CAD- presented with chest pain, Atrial Fibrillation, likely CHF- off Cardizem drip, remains on Heparin, and Cangrelor, balloon pump at 1:1 currently 2. Pulm- likely CHF on presentation, likely underlying COPD.[name] Remains on BIPAP.[name] Will need PFTs prior to surgery 3. H/O Chain saw injury to RLE- will get vein mapping, but ideally will likely need to take vein from left leg 4. ? DM- sugars elevated on admission blood work, A1c is ordered 5. Dispo- patient chest pain free with balloon pump in place, maintaining NSR for the most part, but has continued to have some burst of A. Fib, complete preoperative workup... Possible CABG in AM... Dr. Cornelius Moras is aware of patient and will evaluate later this afternoon   Erin Barrett [name] 03/30/2017 9:43 AM   I have seen and examined the patient and personally reviewed his chart, diagnostic cardiac cath and echocardiogram.  Patient is a 62 year old male with no previous history of coronary artery disease but risk factors notable for long-standing tobacco abuse, hyperlipidemia, and a strong family history of coronary artery disease.  The patient states that approximately 3 years ago he had a prolonged episode of chest discomfort associated with shortness of breath for which he never went to see a physician.  The symptoms lasted for many hours and gradually subsided.  More recently he has remained in his usual state of health although he admits that he gets short of breath with more strenuous  physical exertion.  Yesterday at work he developed sudden onset of severe chest tightness associated with shortness of breath for which he presented to the emergency department.  On arrival he was noted in rapid atrial fibrillation with acute hypoxemic respiratory failure and cardiogenic shock.  He spontaneously converted to sinus rhythm with an EKG revealed diffuse ST changes suspicious for acute myocardial infarction.  He underwent diagnostic cardiac catheterization demonstrating critical three-vessel coronary artery disease with chronic occlusion of the proximal left anterior descending coronary artery and high-grade proximal stenosis both the left circumflex and the right coronary arteries.  Left ventricular end-diastolic pressure was elevated.  Intra-aortic balloon pump was placed in the patient's symptoms and cardiogenic shock resolved.  Follow-up echocardiogram performed earlier today demonstrates severe left ventricular systolic dysfunction.  There was akinesis of the distal anterior wall and apex as well as akinesis of the mid apical lateral myocardium.  Ejection fraction was estimated 25-30%.  The aortic valve appeared normal.  There was no significant mitral regurgitation.    At present the patient denies any ongoing chest pain or chest tightness.  He states that he is breathing much better.  He has diuresed quite well over the course of the day.  Lung fields are clear with the exception of a few slight expiratory wheezes.  Breath sounds are symmetrical.  His extremities are warm and well perfused.  Chest x-ray reveals mild pulmonary vascular congestion with improved interstitial edema since initial chest x-ray performed at the time of arrival in the emergency department.  Arterial blood gas looks remarkably good with resolved hypoxemia.  BNP level was mildly elevated 617.  Troponin levels have remained flat at 1.43 and 1.35.  I feel the patient would best be treated with surgical revascularization.   Risks will be very high because of the underlying severe left ventricular systolic dysfunction as well as comorbid medical conditions including long-standing tobacco abuse with suspected COPD.  He will be at particular risk for perioperative ventricular failure, acute on chronic congestive heart failure, respiratory failure, and renal failure.  Although his clinical condition might continue to improve with additional diuresis, I would be reluctant to consider removal of his intra-aortic balloon pump until surgery has been performed.  He might be best with surgery sooner rather than later.  I have reviewed the indications, risks, and potential benefits of high risk coronary artery bypass grafting with the patient and his family at the bedside.  Alternative treatment strategies have been discussed, including the relative risks, benefits and long term prognosis associated with medical therapy, percutaneous coronary intervention, and surgical revascularization.  The patient understands and accepts all potential associated risks of surgery including but not limited to risk of death, stroke or other neurologic complication, myocardial infarction, congestive heart failure, respiratory failure, renal failure, bleeding requiring blood transfusion and/or reexploration, aortic dissection or other major vascular complication, arrhythmia, heart block or bradycardia requiring permanent pacemaker, pneumonia, pleural effusion, wound infection, pulmonary embolus or other thromboembolic complication, chronic pain or other delayed complications related to median sternotomy, or the late recurrence of symptomatic ischemic heart disease and/or congestive heart failure.  The importance of long term risk modification have been emphasized.  All questions answered.  We tentatively plan to proceed with surgery tomorrow if the patient does well overnight.   I spent in excess of 60 minutes during the conduct of this hospital encounter and  >50% of this time involved direct face-to-face encounter with the patient for counseling and/or coordination of their care.    Ryan Nails, MD 03/30/2017 6:03 PM

## 2017-03-30 NOTE — Progress Notes (Signed)
Lower Extremity Vein Map    Right Great Saphenous Vein Segment Diameter Comment  1. Origin  Unable to visualize  2. High Thigh 3.322mm   3. Mid Thigh 2.7251mm   4. Low Thigh 2.3869mm Branch  5. At Knee 2.7565mm   6. High Calf 2.3978mm Branch  7. Low Calf 2.2214mm Branch  8. Ankle 2.666mm Branch                Right Small Saphenous Vein Not visualized   Left Great Saphenous Vein Segment Diameter Comment  1. Origin  Unable to visualize  2. High Thigh 1.6792mm   3. Mid Thigh 2.7488mm   4. Low Thigh 3.10865mm   5. At Knee 2.2583mm   6. High Calf 2.4437mm Branch  7. Low Calf 2.2247mm   8. Ankle 2.5642mm Branch                Left Small Saphenous Vein Not visualized  03/30/2017 3:56 PM Ryan Durham, BS, RVT, RDCS, RDMS

## 2017-03-30 NOTE — Progress Notes (Signed)
  Echocardiogram 2D Echocardiogram with definity has been performed.  Torrin Crihfield L Androw 03/30/2017, 9:58 AM

## 2017-03-30 NOTE — Progress Notes (Signed)
Progress Note  Patient Name: Ryan Durham Date of Encounter: 03/30/2017  Primary Cardiologist: No primary care provider on file.   Subjective   Feeling better.  No chest pain or shortness of breath.  No palpitations.   Inpatient Medications    Scheduled Meds: . [START ON 03/31/2017] aspirin EC  81 mg Oral Daily  . atorvastatin  80 mg Oral q1800  . chlorhexidine  15 mL Mouth Rinse BID  . furosemide  40 mg Intravenous BID  . mouth rinse  15 mL Mouth Rinse q12n4p  . metoprolol tartrate  12.5 mg Oral Q6H  . sodium chloride flush  3 mL Intravenous Q12H  . sodium chloride flush  3 mL Intravenous Q12H   Continuous Infusions: . sodium chloride    . sodium chloride    . cangrelor 50 mg in NS 250 mL 0.75 mcg/kg/min (03/30/17 0156)  . heparin 1,400 Units/hr (03/30/17 0156)   PRN Meds: sodium chloride, sodium chloride, acetaminophen, nitroGLYCERIN, ondansetron (ZOFRAN) IV, sodium chloride flush, sodium chloride flush   Vital Signs    Vitals:   03/30/17 0615 03/30/17 0630 03/30/17 0645 03/30/17 0700  BP:    130/72  Pulse: 72 74 71 72  Resp: 14 14 14 15   Temp: 98.1 F (36.7 C) 98.1 F (36.7 C) 98.1 F (36.7 C) 98.2 F (36.8 C)  TempSrc:      SpO2: 95% 94% 95% 94%  Weight:      Height:        Intake/Output Summary (Last 24 hours) at 03/30/2017 0805 Last data filed at 03/30/2017 0700 Gross per 24 hour  Intake 179.36 ml  Output 1310 ml  Net -1130.64 ml   Filed Weights   03/29/17 2200 03/30/17 0200  Weight: 210 lb (95.3 kg) 221 lb 5.5 oz (100.4 kg)    Telemetry    Sinus rhythm.  Overnight atrial fibrillation and afib with aberrancy.  NSVT, PVCs. - Personally Reviewed  ECG    Sinus rhythm.  Rate 75 bpm.  Frequent PVCs. Anterolateral ischemia - Personally Reviewed  Physical Exam   VS:  BP 113/76   Pulse 77   Temp 98.4 F (36.9 C)   Resp 14   Ht 5\' 9"  (1.753 m)   Wt 221 lb 5.5 oz (100.4 kg)   SpO2 94%   BMI 32.69 kg/m  , BMI Body mass index is 32.69  kg/m. GENERAL:  Ill-appearing HEENT: Pupils equal round and reactive, fundi not visualized, oral mucosa unremarkable NECK:  +JVD to mid neck at 45 degrees. Waveform within normal limits, carotid upstroke brisk and symmetric, no bruits, no thyromegaly LUNGS:   Bibasilar crackles.  No rhonchi or wheezes.  HEART:  Mostly regualar with occasional ectopy.   PMI not displaced or sustained,S1 and S2 within normal limits, no S3, no S4, no clicks, no rubs, no murmurs ABD:  Flat, positive bowel sounds normal in frequency in pitch, no bruits, no rebound, no guarding, no midline pulsatile mass, no hepatomegaly, no splenomegaly EXT:  2 plus pulses throughout, no edema, no cyanosis no clubbing SKIN:  No rashes no nodules NEURO:  Cranial nerves II through XII grossly intact, motor grossly intact throughout [facility]:  Cognitively intact, oriented to person place and time   Labs    Chemistry Recent Labs  Lab 03/29/17 2125 03/29/17 2141 03/30/17 0249  NA 134* 138 134*  K 5.2* 5.2* 5.2*  CL 99* 101 102  CO2 22  --  20*  GLUCOSE 258* 268* 284*  BUN  14 18 15   CREATININE 1.02 0.90 0.96  CALCIUM 9.4  --  9.4  GFRNONAA >60  --  >60  GFRAA >60  --  >60  ANIONGAP 13  --  12     Hematology Recent Labs  Lab 03/29/17 2125 03/29/17 2141 03/30/17 0249  WBC 17.1*  --  16.2*  RBC 5.29  --  5.44  HGB 17.8* 18.7* 18.3*  HCT 51.5 55.0* 52.9*  MCV 97.4  --  97.2  MCH 33.6  --  33.6  MCHC 34.6  --  34.6  RDW 13.3  --  13.6  PLT 190  --  178    Cardiac Enzymes Recent Labs  Lab 03/29/17 2153  TROPONINI 1.35*    Recent Labs  Lab 03/29/17 2140  TROPIPOC 1.43*     BNPNo results for input(s): BNP, PROBNP in the last 168 hours.   DDimer No results for input(s): DDIMER in the last 168 hours.   Radiology    Dg Chest Port 1 View  Result Date: 03/29/2017 CLINICAL DATA:  Chest pain and shortness of breath. EXAM: PORTABLE CHEST 1 VIEW COMPARISON:  None. FINDINGS: Mild cardiomegaly. Diffuse bronchial  and interstitial thickening. Bilateral Kerley B-lines consistent pulmonary edema. No large pleural effusion. No pneumothorax. No confluent airspace disease. No acute osseous abnormalities are seen. IMPRESSION: Cardiomegaly. Kerley B-lines consistent with pulmonary edema. Bronchial and interstitial thickening may be due to pulmonary edema or bronchial inflammation. Electronically Signed   By: Rubye Oaks M.D.   On: 03/29/2017 23:12    Cardiac Studies   LHC 03/30/17: Coronary angiogram 03/30/17:  Prox RCA lesion is 80% stenosed.  Prox RCA to Dist RCA lesion is 30% stenosed.  Ost RPDA lesion is 99% stenosed.  Post Atrio lesion is 80% stenosed.  Dist LM to Ost LAD lesion is 100% stenosed.  Ost 1st Mrg lesion is 100% stenosed.  Ost Cx to Prox Cx lesion is 99% stenosed.  Ost 2nd Mrg to 2nd Mrg lesion is 70% stenosed.    Patient Profile     62 y.o. male with tobacco abuse and no known prior medical history (doesn't see doctors) who presented with atrial fibrillation with RVR, NSTEMI and hypoxic respiratory failure.    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  The distal LM to ostial LAD was 100% stenosed, as was ostial OM1.  99% ostial LCx and 80% RCA.  He is currently on IABP and awaiting CABG.  Continue aspirin, heparin, cangrelor, metoprolol, and atorvastatin.  Echo pending. Will call CT surgery this morning for CABG consultation.  # Paroxysmal atrial fibrillation: Patient presented in atrial fibrillation in the 170s.  He converted to sinus rhythm on IV diltiazem.  Continue metoprolol.  He is currently in sinus rhythm with frequent PVCs. Continue metoprolol and heparin.   # Acute heart failure, type unknown: LVEDP was 38 mmHg on cath.  Continue diuresis.  Echo pending this AM.  # Hyperlipidemia: LDL 180.  Atorvastatin 80mg  daily started this admission.  # Tobacco abuse: Smoking cessation advised.  He doesn't need a  patch at this time.   Time spent: 45 minutes-Greater than 50% of this time was spent in counseling, explanation of diagnosis, planning of further management, and coordination of care.    For questions or updates, please contact [facility] Please consult [email or web address] for contact info under Cardiology/STEMI.      Signed, Chilton Si,  MD  03/30/2017, 8:05 AM

## 2017-03-30 NOTE — Progress Notes (Signed)
IABP in right groin oozing.  Lidocaine with epi requested by Dr. Excell Seltzerooper for wound track oozing. Right groin site prepped with chloro-prep and then injected above and below IABP insertion site with a total of 6ccof lidocaine with epi. Dr. Excell Seltzerooper present.  Groin site cleaned post with chloro-prep and dressed with bio-spot and tegaderm dressings. Minimal bleeding noted.

## 2017-03-30 NOTE — Anesthesia Preprocedure Evaluation (Addendum)
Anesthesia Evaluation  Patient identified by MRN, date of birth, ID band Patient awake    Reviewed: Allergy & Precautions, NPO status , Patient's Chart, lab work & pertinent test results  History of Anesthesia Complications Negative for: history of anesthetic complications  Airway Mallampati: III  TM Distance: >3 FB Neck ROM: Full    Dental no notable dental hx. (+) Dental Advisory Given   Pulmonary Current Smoker,    Pulmonary exam normal        Cardiovascular + angina + CAD, + Past MI and +CHF  Normal cardiovascular exam+ dysrhythmias Atrial Fibrillation   Study Conclusions  - Left ventricle: The cavity size was mildly dilated. Systolic   function was severely reduced. The estimated ejection fraction   was in the range of 25% to 30%. There is akinesis of the   apicalanterior and inferior myocardium. There is akinesis of the   mid-apicallateral myocardium. There is akinesis of the   entireinferolateral and apical myocardium. Features are   consistent with a pseudonormal left ventricular filling pattern,   with concomitant abnormal relaxation and increased filling   pressure (grade 2 diastolic dysfunction). Doppler parameters are   consistent with high ventricular filling pressure. - Aortic valve: Trileaflet; mildly thickened, mildly calcified   leaflets. Valve area (VTI): 3.17 cm^2. Valve area (Vmax): 2.96   cm^2. Valve area (Vmean): 2.99 cm^2. - Pericardium, extracardiac: A trivial, free-flowing pericardial   effusion was identified along the right ventricular free wall and   along the right atrial free wall. The fluid had no internal   echoes.   Neuro/Psych negative neurological ROS     GI/Hepatic negative GI ROS, Neg liver ROS,   Endo/Other  diabetes  Renal/GU negative Renal ROS     Musculoskeletal negative musculoskeletal ROS (+)   Abdominal   Peds  Hematology negative hematology ROS (+)   Anesthesia  Other Findings Day of surgery medications reviewed with the patient.  Reproductive/Obstetrics                            Anesthesia Physical Anesthesia Plan  ASA: IV  Anesthesia Plan: General   Post-op Pain Management:    Induction: Intravenous  PONV Risk Score and Plan: 2 and Treatment may vary due to age or medical condition, Ondansetron and Midazolam  Airway Management Planned: Oral ETT  Additional Equipment: Arterial line, PA Cath, 3D TEE and Ultrasound Guidance Line Placement  Intra-op Plan:   Post-operative Plan: Post-operative intubation/ventilation  Informed Consent: I have reviewed the patients History and Physical, chart, labs and discussed the procedure including the risks, benefits and alternatives for the proposed anesthesia with the patient or authorized representative who has indicated his/her understanding and acceptance.   Dental advisory given  Plan Discussed with: Anesthesiologist, CRNA and Surgeon  Anesthesia Plan Comments:        Anesthesia Quick Evaluation

## 2017-03-30 NOTE — Progress Notes (Signed)
ANTICOAGULATION CONSULT NOTE - FOLLOW UP    HL = 0.31 (goal 0.2 - 0.5 units/mL) Heparin dosing weight = 91 kg   Assessment: 55 YOM s/p emergent cath and IABP insertion to continue on IV heparin while awaiting CABG tomorrow.  He also has AFib.  Heparin level is therapeutic post rate reduction this AM.  Noted documentation of bleeding/oozing from sheath and Cangrelor has been discontinued.   Plan: Continue heparin gtt at 1300 units/hr F/U AM labs vs post OR Monitor for bleeding resolution   Ryan Durham, PharmD, BCPS 03/30/2017, 4:28 PM

## 2017-03-30 NOTE — Progress Notes (Signed)
Inpatient Diabetes Program Recommendations  AACE/ADA: New Consensus Statement on Inpatient Glycemic Control (2015)  Target Ranges:  Prepandial:   less than 140 mg/dL      Peak postprandial:   less than 180 mg/dL (1-2 hours)      Critically ill patients:  140 - 180 mg/dL   No results found for: GLUCAP, HGBA1C  Review of Glycemic Control Results for Deland PrettyWINDSOR, Latravis (MRN [id number]) as of 03/30/2017 10:43  Ref. Range 03/29/2017 21:25 03/29/2017 21:41 03/30/2017 02:49  Glucose Latest Ref Range: 65 - 99 mg/dL 130258 (H) 865268 (H) 784284 (H)   Diabetes history: None Current orders for Inpatient glycemic control:  None Inpatient Diabetes Program Recommendations:   Note that A1C ordered.  While in the hospital please consider ordering Novolog moderate correction q 4 hours.    Thanks, Beryl MeagerJenny Gearold Wainer, RN, BC-ADM Inpatient Diabetes Coordinator Pager [phone] (8a-5p)

## 2017-03-30 NOTE — Progress Notes (Signed)
Called to help with management of patient's groin site.  He is having significant oozing/bleeding around the femoral arterial sheath in place for his intra-aortic balloon pump.  Discussed case with Dr. Duke Salviaandolph.  Discontinued IV Cangrelor.  The patient will be continued on heparin because of the intra-aortic balloon pump.  The right groin is injected with lidocaine with epinephrine, a total of 6 cc administered.  I was present during the administration of lidocaine with epinephrine and this was performed by Lance BoschBryan Robinson. Pt tolerated well and the site is dry when procedure completed.   Ryan Durham 03/30/2017 4:04 PM

## 2017-03-30 NOTE — Progress Notes (Signed)
Pre-op Cardiac Surgery  Carotid Findings:   Due to balloon pump velocities were unable to be calculated, therefore cannot exclude a hemodynamically significant stenosis. However, by 2D imaging there is no obvious evidence of significant internal carotid artery obstruction bilaterally. Vertebral arteries exhibit antegrade flow.   Balloon pump was temporarily placed on standby while upper and lower extremity Doppler waveforms were obtained. Upper Extremity Right Left  Brachial Pressures 109-Triphasic 126-Triphasic  Radial Waveforms Triphasic Triphasic  Ulnar Waveforms Triphasic Triphasic  Palmar Arch (Allen's Test) Signal is unaffected with radial compression, obliterates with ulnar compression. Signal decreases 50% with radial compression, is unaffected with ulnar compression.   Lower  Extremity Right Left  Dorsalis Pedis Monophasic Monophasic  Anterior Tibial    Posterior Tibial Monophasic Monophasic  Ankle/Brachial Indices 0.71 0.76    Findings:   Bilateral ABIs are suggestive of moderate arterial insufficiency at rest.   03/30/2017 3:32 PM Ryan Durham, BS, RVT, RDCS, RDMS

## 2017-03-30 NOTE — Progress Notes (Signed)
ANTICOAGULATION CONSULT NOTE   Pharmacy Consult for heparin  Indication: atrial fibrillation/3v CAD/IABP  No Known Allergies  Patient Measurements: Heparin Dosing Weight: 90.7  Vital Signs: Temp: 98.4 F (36.9 C) (01/07 0800) Temp Source: Core (01/07 0400) BP: 106/71 (01/07 0900) Pulse Rate: 77 (01/07 0800)  Labs: Recent Labs    03/29/17 2125 03/29/17 2141 03/29/17 2153 03/30/17 0249 03/30/17 0805  HGB 17.8* 18.7*  --  18.3*  --   HCT 51.5 55.0*  --  52.9*  --   PLT 190  --   --  178  --   APTT  --   --  29  --   --   LABPROT  --   --  13.4  --   --   INR  --   --  1.03  --   --   HEPARINUNFRC  --   --   --   --  0.72*  CREATININE 1.02 0.90  --  0.96  --   TROPONINI  --   --  1.35*  --   --     Assessment: 62 y.o. male with ACS s/p emergent cath, found to have severe 3v CAD, IABP inserted and cangrelor bridge started while awaiting CABG. Currently on IV heparin at 1400 units/hr. HL this AM is supratherapeutic at 0.72. H/H 18.3/52.9, Plt wnl. No bleeding noted per RN   Goal of Therapy:  Heparin level 0.3-0.5 Monitor platelets by anticoagulation protocol: Yes   Plan:  Decrease heparin to 1300 units/hr Check heparin level in 6 hours.  CABG scheduled for 1/8  Vinnie LevelBenjamin Malay Fantroy, PharmD., BCPS Clinical Pharmacist Pager [phone]

## 2017-03-31 ENCOUNTER — Inpatient Hospital Stay (HOSPITAL_COMMUNITY): Payer: 59 | Admitting: Certified Registered Nurse Anesthetist

## 2017-03-31 ENCOUNTER — Inpatient Hospital Stay (HOSPITAL_COMMUNITY): Payer: 59

## 2017-03-31 ENCOUNTER — Encounter (HOSPITAL_COMMUNITY)
Admission: EM | Disposition: A | Discharge status: Home / Self Care | Payer: Self-pay | Source: Home / Self Care | Attending: Thoracic Surgery (Cardiothoracic Vascular Surgery)

## 2017-03-31 DIAGNOSIS — Z951 Presence of aortocoronary bypass graft: Secondary | ICD-10-CM

## 2017-03-31 HISTORY — PX: CORONARY ARTERY BYPASS GRAFT: SHX141

## 2017-03-31 HISTORY — PX: TEE WITHOUT CARDIOVERSION: SHX5443

## 2017-03-31 HISTORY — PX: CLIPPING OF ATRIAL APPENDAGE: SHX5773

## 2017-03-31 LAB — GLUCOSE, CAPILLARY
GLUCOSE-CAPILLARY: 121 mg/dL — AB (ref 65–99)
GLUCOSE-CAPILLARY: 143 mg/dL — AB (ref 65–99)
GLUCOSE-CAPILLARY: 130 mg/dL — AB (ref 65–99)
GLUCOSE-CAPILLARY: 178 mg/dL — AB (ref 65–99)
Glucose-Capillary: 108 mg/dL — ABNORMAL HIGH (ref 65–99)
Glucose-Capillary: 134 mg/dL — ABNORMAL HIGH (ref 65–99)
Glucose-Capillary: 152 mg/dL — ABNORMAL HIGH (ref 65–99)
Glucose-Capillary: 176 mg/dL — ABNORMAL HIGH (ref 65–99)
Glucose-Capillary: 187 mg/dL — ABNORMAL HIGH (ref 65–99)

## 2017-03-31 LAB — CBC
HCT: 47.3 % (ref 39.0–52.0)
HCT: 35.9 % — ABNORMAL LOW (ref 39.0–52.0)
HEMATOCRIT: 34.3 % — AB (ref 39.0–52.0)
Hemoglobin: 15.7 g/dL (ref 13.0–17.0)
Hemoglobin: 11.7 g/dL — ABNORMAL LOW (ref 13.0–17.0)
Hemoglobin: 11.3 g/dL — ABNORMAL LOW (ref 13.0–17.0)
MCH: 33.1 pg (ref 26.0–34.0)
MCH: 32.4 pg (ref 26.0–34.0)
MCH: 32.5 pg (ref 26.0–34.0)
MCHC: 33.2 g/dL (ref 30.0–36.0)
MCHC: 32.6 g/dL (ref 30.0–36.0)
MCHC: 32.9 g/dL (ref 30.0–36.0)
MCV: 99.8 fL (ref 78.0–100.0)
MCV: 99.4 fL (ref 78.0–100.0)
MCV: 98.6 fL (ref 78.0–100.0)
PLATELETS: 138 10*3/uL — AB (ref 150–400)
PLATELETS: 137 10*3/uL — AB (ref 150–400)
Platelets: 128 10*3/uL — ABNORMAL LOW (ref 150–400)
RBC: 4.74 MIL/uL (ref 4.22–5.81)
RBC: 3.61 MIL/uL — AB (ref 4.22–5.81)
RBC: 3.48 MIL/uL — ABNORMAL LOW (ref 4.22–5.81)
RDW: 14.1 % (ref 11.5–15.5)
RDW: 13.8 % (ref 11.5–15.5)
RDW: 13.7 % (ref 11.5–15.5)
WBC: 13.6 10*3/uL — ABNORMAL HIGH (ref 4.0–10.5)
WBC: 18.4 10*3/uL — ABNORMAL HIGH (ref 4.0–10.5)
WBC: 15.1 10*3/uL — ABNORMAL HIGH (ref 4.0–10.5)

## 2017-03-31 LAB — MAGNESIUM: Magnesium: 2.8 mg/dL — ABNORMAL HIGH (ref 1.7–2.4)

## 2017-03-31 LAB — POCT I-STAT, CHEM 8
BUN: 25 mg/dL — ABNORMAL HIGH (ref 6–20)
BUN: 23 mg/dL — ABNORMAL HIGH (ref 6–20)
BUN: 26 mg/dL — ABNORMAL HIGH (ref 6–20)
BUN: 22 mg/dL — AB (ref 6–20)
BUN: 21 mg/dL — AB (ref 6–20)
BUN: 22 mg/dL — AB (ref 6–20)
CALCIUM ION: 1.14 mmol/L — AB (ref 1.15–1.40)
CALCIUM ION: 1.11 mmol/L — AB (ref 1.15–1.40)
CHLORIDE: 100 mmol/L — AB (ref 101–111)
CHLORIDE: 98 mmol/L — AB (ref 101–111)
CHLORIDE: 99 mmol/L — AB (ref 101–111)
Calcium, Ion: 1.2 mmol/L (ref 1.15–1.40)
Calcium, Ion: 1 mmol/L — ABNORMAL LOW (ref 1.15–1.40)
Calcium, Ion: 1.02 mmol/L — ABNORMAL LOW (ref 1.15–1.40)
Calcium, Ion: 1.17 mmol/L (ref 1.15–1.40)
Chloride: 98 mmol/L — ABNORMAL LOW (ref 101–111)
Chloride: 102 mmol/L (ref 101–111)
Chloride: 104 mmol/L (ref 101–111)
Creatinine, Ser: 0.8 mg/dL (ref 0.61–1.24)
Creatinine, Ser: 0.7 mg/dL (ref 0.61–1.24)
Creatinine, Ser: 0.9 mg/dL (ref 0.61–1.24)
Creatinine, Ser: 0.8 mg/dL (ref 0.61–1.24)
Creatinine, Ser: 0.8 mg/dL (ref 0.61–1.24)
Creatinine, Ser: 0.8 mg/dL (ref 0.61–1.24)
GLUCOSE: 151 mg/dL — AB (ref 65–99)
GLUCOSE: 134 mg/dL — AB (ref 65–99)
Glucose, Bld: 129 mg/dL — ABNORMAL HIGH (ref 65–99)
Glucose, Bld: 141 mg/dL — ABNORMAL HIGH (ref 65–99)
Glucose, Bld: 142 mg/dL — ABNORMAL HIGH (ref 65–99)
Glucose, Bld: 160 mg/dL — ABNORMAL HIGH (ref 65–99)
HCT: 43 % (ref 39.0–52.0)
HCT: 34 % — ABNORMAL LOW (ref 39.0–52.0)
HCT: 33 % — ABNORMAL LOW (ref 39.0–52.0)
HEMATOCRIT: 48 % (ref 39.0–52.0)
HEMATOCRIT: 36 % — AB (ref 39.0–52.0)
HEMATOCRIT: 33 % — AB (ref 39.0–52.0)
HEMOGLOBIN: 16.3 g/dL (ref 13.0–17.0)
Hemoglobin: 12.2 g/dL — ABNORMAL LOW (ref 13.0–17.0)
Hemoglobin: 14.6 g/dL (ref 13.0–17.0)
Hemoglobin: 11.2 g/dL — ABNORMAL LOW (ref 13.0–17.0)
Hemoglobin: 11.6 g/dL — ABNORMAL LOW (ref 13.0–17.0)
Hemoglobin: 11.2 g/dL — ABNORMAL LOW (ref 13.0–17.0)
POTASSIUM: 4.5 mmol/L (ref 3.5–5.1)
POTASSIUM: 4.2 mmol/L (ref 3.5–5.1)
POTASSIUM: 5.2 mmol/L — AB (ref 3.5–5.1)
Potassium: 4.4 mmol/L (ref 3.5–5.1)
Potassium: 3.9 mmol/L (ref 3.5–5.1)
Potassium: 4.2 mmol/L (ref 3.5–5.1)
SODIUM: 139 mmol/L (ref 135–145)
SODIUM: 140 mmol/L (ref 135–145)
SODIUM: 136 mmol/L (ref 135–145)
Sodium: 138 mmol/L (ref 135–145)
Sodium: 139 mmol/L (ref 135–145)
Sodium: 140 mmol/L (ref 135–145)
TCO2: 30 mmol/L (ref 22–32)
TCO2: 28 mmol/L (ref 22–32)
TCO2: 30 mmol/L (ref 22–32)
TCO2: 26 mmol/L (ref 22–32)
TCO2: 26 mmol/L (ref 22–32)
TCO2: 24 mmol/L (ref 22–32)

## 2017-03-31 LAB — POCT I-STAT 3, ART BLOOD GAS (G3+)
ACID-BASE EXCESS: 2 mmol/L (ref 0.0–2.0)
Acid-Base Excess: 4 mmol/L — ABNORMAL HIGH (ref 0.0–2.0)
Acid-Base Excess: 4 mmol/L — ABNORMAL HIGH (ref 0.0–2.0)
BICARBONATE: 30.2 mmol/L — AB (ref 20.0–28.0)
BICARBONATE: 24.8 mmol/L (ref 20.0–28.0)
Bicarbonate: 29.3 mmol/L — ABNORMAL HIGH (ref 20.0–28.0)
Bicarbonate: 25.1 mmol/L (ref 20.0–28.0)
Bicarbonate: 25 mmol/L (ref 20.0–28.0)
O2 SAT: 100 %
O2 SAT: 91 %
O2 SAT: 98 %
O2 SAT: 98 %
O2 Saturation: 100 %
PCO2 ART: 45.1 mmHg (ref 32.0–48.0)
PCO2 ART: 39.6 mmHg (ref 32.0–48.0)
PCO2 ART: 52.7 mmHg — AB (ref 32.0–48.0)
PCO2 ART: 35.2 mmHg (ref 32.0–48.0)
PH ART: 7.421 (ref 7.350–7.450)
PH ART: 7.405 (ref 7.350–7.450)
PO2 ART: 167 mmHg — AB (ref 83.0–108.0)
PO2 ART: 65 mmHg — AB (ref 83.0–108.0)
PO2 ART: 100 mmHg (ref 83.0–108.0)
Patient temperature: 37.6
Patient temperature: 37.8
Patient temperature: 38.4
TCO2: 31 mmol/L (ref 22–32)
TCO2: 26 mmol/L (ref 22–32)
TCO2: 32 mmol/L (ref 22–32)
TCO2: 26 mmol/L (ref 22–32)
TCO2: 26 mmol/L (ref 22–32)
pCO2 arterial: 39.9 mmHg (ref 32.0–48.0)
pH, Arterial: 7.409 (ref 7.350–7.450)
pH, Arterial: 7.37 (ref 7.350–7.450)
pH, Arterial: 7.465 — ABNORMAL HIGH (ref 7.350–7.450)
pO2, Arterial: 406 mmHg — ABNORMAL HIGH (ref 83.0–108.0)
pO2, Arterial: 107 mmHg (ref 83.0–108.0)

## 2017-03-31 LAB — BASIC METABOLIC PANEL
ANION GAP: 11 (ref 5–15)
BUN: 23 mg/dL — ABNORMAL HIGH (ref 6–20)
CHLORIDE: 100 mmol/L — AB (ref 101–111)
CO2: 25 mmol/L (ref 22–32)
CREATININE: 1.04 mg/dL (ref 0.61–1.24)
Calcium: 8.8 mg/dL — ABNORMAL LOW (ref 8.9–10.3)
GFR calc non Af Amer: >60 mL/min (ref >60)
GLUCOSE: 147 mg/dL — AB (ref 65–99)
Potassium: 4.1 mmol/L (ref 3.5–5.1)
Sodium: 136 mmol/L (ref 135–145)

## 2017-03-31 LAB — CREATININE, SERUM
CREATININE: 0.94 mg/dL (ref 0.61–1.24)
GFR calc Af Amer: >60 mL/min (ref >60)
GFR calc non Af Amer: >60 mL/min (ref >60)

## 2017-03-31 LAB — POCT I-STAT 4, (NA,K, GLUC, HGB,HCT)
Glucose, Bld: 132 mg/dL — ABNORMAL HIGH (ref 65–99)
HCT: 34 % — ABNORMAL LOW (ref 39.0–52.0)
Hemoglobin: 11.6 g/dL — ABNORMAL LOW (ref 13.0–17.0)
Potassium: 4.2 mmol/L (ref 3.5–5.1)
Sodium: 140 mmol/L (ref 135–145)

## 2017-03-31 LAB — PLATELET COUNT: PLATELETS: 81 10*3/uL — AB (ref 150–400)

## 2017-03-31 LAB — APTT: aPTT: 33 seconds (ref 24–36)

## 2017-03-31 LAB — PROTIME-INR
INR: 1.31
PROTHROMBIN TIME: 16.1 s — AB (ref 11.4–15.2)

## 2017-03-31 LAB — HEMOGLOBIN AND HEMATOCRIT, BLOOD
HEMATOCRIT: 34.3 % — AB (ref 39.0–52.0)
HEMOGLOBIN: 11.1 g/dL — AB (ref 13.0–17.0)

## 2017-03-31 SURGERY — CORONARY ARTERY BYPASS GRAFTING (CABG)
Anesthesia: General | Site: Chest

## 2017-03-31 MED ORDER — ACETAMINOPHEN 500 MG PO TABS
1000.0000 mg | ORAL_TABLET | Freq: Four times a day (QID) | ORAL | Status: AC
  Administered 2017-04-01 – 2017-04-05 (×17): 1000 mg via ORAL
  Filled 2017-03-31 (×18): qty 2

## 2017-03-31 MED ORDER — LACTATED RINGERS IV SOLN: INTRAVENOUS | Status: DC

## 2017-03-31 MED ORDER — SODIUM CHLORIDE 0.9 % IV SOLN: Freq: Once | INTRAVENOUS | Status: DC

## 2017-03-31 MED ORDER — MAGNESIUM SULFATE 4 GM/100ML IV SOLN
4.0000 g | Freq: Once | INTRAVENOUS | Status: AC
  Administered 2017-03-31: 4 g via INTRAVENOUS
  Filled 2017-03-31: qty 100

## 2017-03-31 MED ORDER — LACTATED RINGERS IV SOLN
INTRAVENOUS | Status: DC | PRN
  Administered 2017-03-31: 07:00:00 via INTRAVENOUS

## 2017-03-31 MED ORDER — SODIUM CHLORIDE 0.9% FLUSH: 3.0000 mL | INTRAVENOUS | Status: DC | PRN

## 2017-03-31 MED ORDER — SODIUM CHLORIDE 0.45 % IV SOLN
INTRAVENOUS | Status: DC | PRN
  Administered 2017-03-31 – 2017-04-01 (×2): via INTRAVENOUS

## 2017-03-31 MED ORDER — ACETAMINOPHEN 160 MG/5ML PO SOLN: 650.0000 mg | Freq: Once | ORAL | Status: AC

## 2017-03-31 MED ORDER — SODIUM CHLORIDE 0.9 % IV SOLN
INTRAVENOUS | Status: DC
  Administered 2017-04-01: 5.3 [IU]/h via INTRAVENOUS
  Filled 2017-03-31: qty 1

## 2017-03-31 MED ORDER — PROTAMINE SULFATE 10 MG/ML IV SOLN
INTRAVENOUS | Status: DC | PRN
  Administered 2017-03-31: 20 mg via INTRAVENOUS
  Administered 2017-03-31: 280 mg via INTRAVENOUS

## 2017-03-31 MED ORDER — ALBUMIN HUMAN 5 % IV SOLN
INTRAVENOUS | Status: DC | PRN
  Administered 2017-03-31: 13:00:00 via INTRAVENOUS

## 2017-03-31 MED ORDER — METOPROLOL TARTRATE 5 MG/5ML IV SOLN: 2.5000 mg | INTRAVENOUS | Status: DC | PRN

## 2017-03-31 MED ORDER — INSULIN REGULAR BOLUS VIA INFUSION
0.0000 [IU] | Freq: Three times a day (TID) | INTRAVENOUS | Status: DC
  Filled 2017-03-31: qty 10

## 2017-03-31 MED ORDER — ONDANSETRON HCL 4 MG/2ML IJ SOLN
4.0000 mg | Freq: Four times a day (QID) | INTRAMUSCULAR | Status: DC | PRN
  Administered 2017-04-03: 4 mg via INTRAVENOUS
  Filled 2017-03-31: qty 2

## 2017-03-31 MED ORDER — CHLORHEXIDINE GLUCONATE 0.12% ORAL RINSE (MEDLINE KIT)
15.0000 mL | Freq: Two times a day (BID) | OROMUCOSAL | Status: DC
  Administered 2017-03-31 – 2017-04-01 (×2): 15 mL via OROMUCOSAL

## 2017-03-31 MED ORDER — FENTANYL CITRATE (PF) 250 MCG/5ML IJ SOLN
INTRAMUSCULAR | Status: AC
  Filled 2017-03-31: qty 5

## 2017-03-31 MED ORDER — FAMOTIDINE IN NACL 20-0.9 MG/50ML-% IV SOLN
20.0000 mg | Freq: Two times a day (BID) | INTRAVENOUS | Status: AC
  Administered 2017-03-31 (×2): 20 mg via INTRAVENOUS
  Filled 2017-03-31: qty 50

## 2017-03-31 MED ORDER — ROCURONIUM BROMIDE 10 MG/ML (PF) SYRINGE
PREFILLED_SYRINGE | INTRAVENOUS | Status: DC | PRN
  Administered 2017-03-31: 50 mg via INTRAVENOUS
  Administered 2017-03-31: 20 mg via INTRAVENOUS
  Administered 2017-03-31 (×2): 50 mg via INTRAVENOUS
  Administered 2017-03-31: 100 mg via INTRAVENOUS

## 2017-03-31 MED ORDER — BISACODYL 10 MG RE SUPP
10.0000 mg | Freq: Every day | RECTAL | Status: DC
Start: 2017-04-01 — End: 2017-04-09

## 2017-03-31 MED ORDER — ALBUMIN HUMAN 5 % IV SOLN
250.0000 mL | INTRAVENOUS | Status: AC | PRN
  Administered 2017-03-31 – 2017-04-01 (×4): 250 mL via INTRAVENOUS
  Filled 2017-03-31 (×2): qty 250

## 2017-03-31 MED ORDER — SODIUM CHLORIDE 0.9 % IJ SOLN
OROMUCOSAL | Status: DC | PRN
  Administered 2017-03-31 (×3): 4 mL via TOPICAL

## 2017-03-31 MED ORDER — DEXMEDETOMIDINE HCL IN NACL 400 MCG/100ML IV SOLN
0.0000 ug/kg/h | INTRAVENOUS | Status: DC
  Administered 2017-03-31 (×3): 0.7 ug/kg/h via INTRAVENOUS
  Administered 2017-04-01: 0.3 ug/kg/h via INTRAVENOUS
  Filled 2017-03-31 (×3): qty 100

## 2017-03-31 MED ORDER — DOCUSATE SODIUM 100 MG PO CAPS
200.0000 mg | ORAL_CAPSULE | Freq: Every day | ORAL | Status: DC
  Administered 2017-04-01 – 2017-04-08 (×8): 200 mg via ORAL
  Filled 2017-03-31 (×9): qty 2

## 2017-03-31 MED ORDER — PHENYLEPHRINE HCL 10 MG/ML IJ SOLN
0.0000 ug/min | INTRAMUSCULAR | Status: DC
  Filled 2017-03-31: qty 2

## 2017-03-31 MED ORDER — SODIUM CHLORIDE 0.9 % IV SOLN
INTRAVENOUS | Status: DC
  Administered 2017-03-31: 15:00:00 via INTRAVENOUS

## 2017-03-31 MED ORDER — LIDOCAINE 2% (20 MG/ML) 5 ML SYRINGE
INTRAMUSCULAR | Status: DC | PRN
  Administered 2017-03-31: 100 mg via INTRAVENOUS

## 2017-03-31 MED ORDER — MORPHINE SULFATE (PF) 4 MG/ML IV SOLN
1.0000 mg | INTRAVENOUS | Status: DC | PRN
  Administered 2017-03-31: 2 mg via INTRAVENOUS

## 2017-03-31 MED ORDER — LACTATED RINGERS IV SOLN
INTRAVENOUS | Status: DC | PRN
Start: 2017-03-31 — End: 2017-03-31
  Administered 2017-03-31: 08:00:00 via INTRAVENOUS

## 2017-03-31 MED ORDER — LACTATED RINGERS IV SOLN
INTRAVENOUS | Status: DC
  Administered 2017-04-03: 10 mL/h via INTRAVENOUS

## 2017-03-31 MED ORDER — POTASSIUM CHLORIDE 10 MEQ/50ML IV SOLN: 10.0000 meq | INTRAVENOUS | Status: AC

## 2017-03-31 MED ORDER — TRAMADOL HCL 50 MG PO TABS
50.0000 mg | ORAL_TABLET | ORAL | Status: DC | PRN
  Administered 2017-04-04 – 2017-04-06 (×2): 100 mg via ORAL
  Filled 2017-03-31 (×2): qty 2

## 2017-03-31 MED ORDER — LACTATED RINGERS IV SOLN
INTRAVENOUS | Status: DC | PRN
  Administered 2017-03-31: 08:00:00 via INTRAVENOUS

## 2017-03-31 MED ORDER — LACTATED RINGERS IV SOLN: 500.0000 mL | Freq: Once | INTRAVENOUS | Status: DC | PRN

## 2017-03-31 MED ORDER — SODIUM CHLORIDE 0.9 % IV SOLN
0.0000 ug/min | INTRAVENOUS | Status: DC
  Administered 2017-03-31: 75 ug/min via INTRAVENOUS
  Administered 2017-04-01: 60 ug/min via INTRAVENOUS
  Filled 2017-03-31 (×4): qty 4

## 2017-03-31 MED ORDER — NITROGLYCERIN IN D5W 200-5 MCG/ML-% IV SOLN: 0.0000 ug/min | INTRAVENOUS | Status: DC

## 2017-03-31 MED ORDER — SODIUM CHLORIDE 0.9 % IV SOLN
INTRAVENOUS | Status: DC
Start: 2017-04-01 — End: 2017-03-31

## 2017-03-31 MED ORDER — PANTOPRAZOLE SODIUM 40 MG PO TBEC
40.0000 mg | DELAYED_RELEASE_TABLET | Freq: Every day | ORAL | Status: DC
  Administered 2017-04-02 – 2017-04-09 (×8): 40 mg via ORAL
  Filled 2017-03-31 (×8): qty 1

## 2017-03-31 MED ORDER — CHLORHEXIDINE GLUCONATE 0.12 % MT SOLN
15.0000 mL | OROMUCOSAL | Status: AC
  Administered 2017-03-31: 15 mL via OROMUCOSAL

## 2017-03-31 MED ORDER — CEFUROXIME SODIUM 1.5 G IV SOLR
1.5000 g | Freq: Two times a day (BID) | INTRAVENOUS | Status: AC
  Administered 2017-03-31 – 2017-04-02 (×4): 1.5 g via INTRAVENOUS
  Filled 2017-03-31 (×4): qty 1.5

## 2017-03-31 MED ORDER — ASPIRIN 81 MG PO CHEW
324.0000 mg | CHEWABLE_TABLET | Freq: Every day | ORAL | Status: DC
  Administered 2017-04-01: 324 mg
  Filled 2017-03-31: qty 4

## 2017-03-31 MED ORDER — FENTANYL CITRATE (PF) 250 MCG/5ML IJ SOLN
INTRAMUSCULAR | Status: DC | PRN
  Administered 2017-03-31: 100 ug via INTRAVENOUS
  Administered 2017-03-31: 150 ug via INTRAVENOUS
  Administered 2017-03-31: 225 ug via INTRAVENOUS
  Administered 2017-03-31: 100 ug via INTRAVENOUS
  Administered 2017-03-31: 25 ug via INTRAVENOUS
  Administered 2017-03-31: 250 ug via INTRAVENOUS
  Administered 2017-03-31 (×2): 150 ug via INTRAVENOUS
  Administered 2017-03-31: 50 ug via INTRAVENOUS
  Administered 2017-03-31 (×3): 100 ug via INTRAVENOUS

## 2017-03-31 MED ORDER — BISACODYL 5 MG PO TBEC
10.0000 mg | DELAYED_RELEASE_TABLET | Freq: Every day | ORAL | Status: DC
  Administered 2017-04-01 – 2017-04-08 (×6): 10 mg via ORAL
  Filled 2017-03-31 (×6): qty 2

## 2017-03-31 MED ORDER — OXYCODONE HCL 5 MG PO TABS
5.0000 mg | ORAL_TABLET | ORAL | Status: DC | PRN
  Administered 2017-04-01 – 2017-04-02 (×4): 10 mg via ORAL
  Filled 2017-03-31 (×4): qty 2

## 2017-03-31 MED ORDER — SODIUM CHLORIDE 0.9 % IV SOLN
INTRAVENOUS | Status: DC
  Administered 2017-03-31 – 2017-04-01 (×2): via INTRAVENOUS

## 2017-03-31 MED ORDER — ASPIRIN EC 325 MG PO TBEC: 325.0000 mg | DELAYED_RELEASE_TABLET | Freq: Every day | ORAL | Status: DC

## 2017-03-31 MED ORDER — ORAL CARE MOUTH RINSE
15.0000 mL | Freq: Four times a day (QID) | OROMUCOSAL | Status: DC
  Administered 2017-03-31 – 2017-04-01 (×2): 15 mL via OROMUCOSAL

## 2017-03-31 MED ORDER — HEPARIN SODIUM (PORCINE) 1000 UNIT/ML IJ SOLN
INTRAMUSCULAR | Status: DC | PRN
  Administered 2017-03-31: 3000 [IU] via INTRAVENOUS
  Administered 2017-03-31: 23000 [IU] via INTRAVENOUS

## 2017-03-31 MED ORDER — MILRINONE LACTATE IN DEXTROSE 20-5 MG/100ML-% IV SOLN
0.1250 ug/kg/min | INTRAVENOUS | Status: DC
  Administered 2017-03-31 – 2017-04-02 (×3): 0.3 ug/kg/min via INTRAVENOUS
  Administered 2017-04-02: 0.2 ug/kg/min via INTRAVENOUS
  Administered 2017-04-03 – 2017-04-04 (×2): 0.125 ug/kg/min via INTRAVENOUS
  Filled 2017-03-31 (×7): qty 100

## 2017-03-31 MED ORDER — NOREPINEPHRINE BITARTRATE 1 MG/ML IV SOLN
0.0000 ug/min | INTRAVENOUS | Status: DC
  Administered 2017-03-31: 2 ug/min via INTRAVENOUS
  Filled 2017-03-31 (×2): qty 16

## 2017-03-31 MED ORDER — 0.9 % SODIUM CHLORIDE (POUR BTL) OPTIME
TOPICAL | Status: DC | PRN
  Administered 2017-03-31: 6000 mL

## 2017-03-31 MED ORDER — SODIUM CHLORIDE 0.9 % IV SOLN: 250.0000 mL | INTRAVENOUS | Status: DC | PRN

## 2017-03-31 MED ORDER — SODIUM CHLORIDE 0.9 % IV SOLN: 250.0000 mL | INTRAVENOUS | Status: DC

## 2017-03-31 MED ORDER — MORPHINE SULFATE (PF) 4 MG/ML IV SOLN
1.0000 mg | INTRAVENOUS | Status: DC | PRN
  Administered 2017-04-01 (×2): 2 mg via INTRAVENOUS
  Filled 2017-03-31 (×3): qty 1

## 2017-03-31 MED ORDER — MIDAZOLAM HCL 10 MG/2ML IJ SOLN
INTRAMUSCULAR | Status: AC
  Filled 2017-03-31: qty 2

## 2017-03-31 MED ORDER — FENTANYL CITRATE (PF) 250 MCG/5ML IJ SOLN
INTRAMUSCULAR | Status: AC
  Filled 2017-03-31: qty 25

## 2017-03-31 MED ORDER — SODIUM CHLORIDE 0.9% FLUSH
3.0000 mL | Freq: Two times a day (BID) | INTRAVENOUS | Status: DC
  Administered 2017-03-31 – 2017-04-07 (×9): 3 mL via INTRAVENOUS

## 2017-03-31 MED ORDER — PROPOFOL 10 MG/ML IV BOLUS
INTRAVENOUS | Status: AC
  Filled 2017-03-31: qty 20

## 2017-03-31 MED ORDER — MIDAZOLAM HCL 5 MG/5ML IJ SOLN
INTRAMUSCULAR | Status: DC | PRN
  Administered 2017-03-31: 2 mg via INTRAVENOUS
  Administered 2017-03-31: 1 mg via INTRAVENOUS
  Administered 2017-03-31: 2 mg via INTRAVENOUS

## 2017-03-31 MED ORDER — ETOMIDATE 2 MG/ML IV SOLN
INTRAVENOUS | Status: DC | PRN
  Administered 2017-03-31: 14 mg via INTRAVENOUS

## 2017-03-31 MED ORDER — VANCOMYCIN HCL IN DEXTROSE 1-5 GM/200ML-% IV SOLN
1000.0000 mg | Freq: Once | INTRAVENOUS | Status: AC
  Administered 2017-03-31: 1000 mg via INTRAVENOUS
  Filled 2017-03-31: qty 200

## 2017-03-31 MED ORDER — ACETAMINOPHEN 650 MG RE SUPP
650.0000 mg | Freq: Once | RECTAL | Status: AC
  Administered 2017-03-31: 650 mg via RECTAL

## 2017-03-31 MED ORDER — LEVALBUTEROL HCL 1.25 MG/0.5ML IN NEBU
1.2500 mg | INHALATION_SOLUTION | Freq: Four times a day (QID) | RESPIRATORY_TRACT | Status: DC | PRN
  Administered 2017-03-31 – 2017-04-03 (×2): 1.25 mg via RESPIRATORY_TRACT
  Filled 2017-03-31 (×2): qty 0.5

## 2017-03-31 MED ORDER — MIDAZOLAM HCL 2 MG/2ML IJ SOLN: 2.0000 mg | INTRAMUSCULAR | Status: DC | PRN

## 2017-03-31 MED ORDER — SODIUM CHLORIDE 0.9% FLUSH: 3.0000 mL | Freq: Two times a day (BID) | INTRAVENOUS | Status: DC

## 2017-03-31 MED ORDER — SUCCINYLCHOLINE CHLORIDE 20 MG/ML IJ SOLN
INTRAMUSCULAR | Status: DC | PRN
  Administered 2017-03-31: 140 mg via INTRAVENOUS

## 2017-03-31 MED ORDER — ACETAMINOPHEN 160 MG/5ML PO SOLN
1000.0000 mg | Freq: Four times a day (QID) | ORAL | Status: DC
  Administered 2017-03-31 – 2017-04-01 (×2): 1000 mg
  Filled 2017-03-31 (×2): qty 40.6

## 2017-03-31 MED ORDER — DOPAMINE-DEXTROSE 3.2-5 MG/ML-% IV SOLN
0.0000 ug/kg/min | INTRAVENOUS | Status: DC
  Administered 2017-04-02: 3 ug/kg/min via INTRAVENOUS
  Filled 2017-03-31: qty 250

## 2017-03-31 SURGICAL SUPPLY — 144 items
ADAPTER CARDIO PERF ANTE/RETRO (ADAPTER) ×4 IMPLANT
APPLICATOR COTTON TIP 6IN STRL (MISCELLANEOUS) ×4 IMPLANT
APPLIER CLIP 9.375 SM OPEN (CLIP) ×8
ARTICLIP LAA PROCLIP II 40 (Clip) ×4 IMPLANT
BAG DECANTER FOR FLEXI CONT (MISCELLANEOUS) ×8 IMPLANT
BANDAGE ACE 3X5.8 VEL STRL LF (GAUZE/BANDAGES/DRESSINGS) IMPLANT
BANDAGE ACE 4X5 VEL STRL LF (GAUZE/BANDAGES/DRESSINGS) ×8 IMPLANT
BANDAGE ACE 6X5 VEL STRL LF (GAUZE/BANDAGES/DRESSINGS) ×8 IMPLANT
BASKET HEART (ORDER IN 25'S) (MISCELLANEOUS) ×1
BASKET HEART (ORDER IN 25S) (MISCELLANEOUS) ×3 IMPLANT
BENZOIN TINCTURE PRP APPL 2/3 (GAUZE/BANDAGES/DRESSINGS) ×4 IMPLANT
BIOPATCH BLUE 3/4IN DISK W/1.5 (GAUZE/BANDAGES/DRESSINGS) ×8 IMPLANT
BLADE CLIPPER SURG (BLADE) IMPLANT
BLADE STERNUM SYSTEM 6 (BLADE) ×4 IMPLANT
BLADE SURG 11 STRL SS (BLADE) ×4 IMPLANT
BNDG GAUZE ELAST 4 BULKY (GAUZE/BANDAGES/DRESSINGS) ×8 IMPLANT
CANISTER SUCT 3000ML PPV (MISCELLANEOUS) ×8 IMPLANT
CANNULA EZ GLIDE AORTIC 21FR (CANNULA) ×8 IMPLANT
CANNULA FEM VENOUS REMOTE 22FR (CANNULA) IMPLANT
CANNULA GUNDRY RCSP 15FR (MISCELLANEOUS) ×4 IMPLANT
CANNULA OPTISITE PERFUSION 16F (CANNULA) IMPLANT
CANNULA OPTISITE PERFUSION 18F (CANNULA) IMPLANT
CATH CPB KIT OWEN (MISCELLANEOUS) ×4 IMPLANT
CATH HEART VENT LEFT (CATHETERS) ×3 IMPLANT
CATH THORACIC 36FR (CATHETERS) ×4 IMPLANT
CLIP APPLIE 9.375 SM OPEN (CLIP) ×6 IMPLANT
CLIP FOGARTY SPRING 6M (CLIP) ×4 IMPLANT
CLIP RETRACTION 3.0MM CORONARY (MISCELLANEOUS) ×4 IMPLANT
CLIP VESOCCLUDE MED 24/CT (CLIP) IMPLANT
CLIP VESOCCLUDE SM WIDE 24/CT (CLIP) IMPLANT
CONN ST 1/4X3/8  BEN (MISCELLANEOUS) ×2
CONN ST 1/4X3/8 BEN (MISCELLANEOUS) ×6 IMPLANT
COVER MAYO STAND STRL (DRAPES) ×4 IMPLANT
COVER SURGICAL LIGHT HANDLE (MISCELLANEOUS) ×4 IMPLANT
CRADLE DONUT ADULT HEAD (MISCELLANEOUS) ×4 IMPLANT
DERMABOND ADVANCED (GAUZE/BANDAGES/DRESSINGS) ×2
DERMABOND ADVANCED .7 DNX12 (GAUZE/BANDAGES/DRESSINGS) ×6 IMPLANT
DEVICE ATRICLIP LAA PRCLPII 40 (Clip) ×3 IMPLANT
DEVICE TROCAR PUNCTURE CLOSURE (ENDOMECHANICALS) ×4 IMPLANT
DRAIN CHANNEL 28F RND 3/8 FF (WOUND CARE) ×8 IMPLANT
DRAIN CHANNEL 32F RND 10.7 FF (WOUND CARE) ×8 IMPLANT
DRAPE BILATERAL SPLIT (DRAPES) ×4 IMPLANT
DRAPE C-ARM 42X72 X-RAY (DRAPES) IMPLANT
DRAPE CARDIOVASCULAR INCISE (DRAPES) ×1
DRAPE CV SPLIT W-CLR ANES SCRN (DRAPES) ×4 IMPLANT
DRAPE INCISE IOBAN 66X45 STRL (DRAPES) ×8 IMPLANT
DRAPE SLUSH/WARMER DISC (DRAPES) ×4 IMPLANT
DRAPE SRG 135X102X78XABS (DRAPES) ×3 IMPLANT
DRSG AQUACEL AG ADV 3.5X14 (GAUZE/BANDAGES/DRESSINGS) ×4 IMPLANT
DRSG COVADERM 4X14 (GAUZE/BANDAGES/DRESSINGS) ×4 IMPLANT
DRSG COVADERM 4X8 (GAUZE/BANDAGES/DRESSINGS) ×4 IMPLANT
ELECT BLADE 4.0 EZ CLEAN MEGAD (MISCELLANEOUS) ×4
ELECT BLADE 6.5 EXT (BLADE) ×4 IMPLANT
ELECT REM PT RETURN 9FT ADLT (ELECTROSURGICAL) ×8
ELECTRODE BLDE 4.0 EZ CLN MEGD (MISCELLANEOUS) ×3 IMPLANT
ELECTRODE REM PT RTRN 9FT ADLT (ELECTROSURGICAL) ×6 IMPLANT
FELT TEFLON 1X6 (MISCELLANEOUS) ×4 IMPLANT
GAUZE SPONGE 4X4 12PLY STRL (GAUZE/BANDAGES/DRESSINGS) ×8 IMPLANT
GAUZE SPONGE 4X4 12PLY STRL LF (GAUZE/BANDAGES/DRESSINGS) ×4 IMPLANT
GLOVE BIO SURGEON STRL SZ 6 (GLOVE) ×16 IMPLANT
GLOVE BIO SURGEON STRL SZ 6.5 (GLOVE) ×8 IMPLANT
GLOVE BIOGEL PI IND STRL 6 (GLOVE) ×6 IMPLANT
GLOVE BIOGEL PI INDICATOR 6 (GLOVE) ×2
GLOVE ORTHO TXT STRL SZ7.5 (GLOVE) ×12 IMPLANT
GOWN STRL REUS W/ TWL LRG LVL3 (GOWN DISPOSABLE) ×27 IMPLANT
GOWN STRL REUS W/TWL LRG LVL3 (GOWN DISPOSABLE) ×9
HEMOSTAT POWDER SURGIFOAM 1G (HEMOSTASIS) ×12 IMPLANT
INSERT FOGARTY XLG (MISCELLANEOUS) ×4 IMPLANT
KIT BASIN OR (CUSTOM PROCEDURE TRAY) ×4 IMPLANT
KIT DILATOR VASC 18G NDL (KITS) ×4 IMPLANT
KIT DRAINAGE VACCUM ASSIST (KITS) ×4 IMPLANT
KIT ROOM TURNOVER OR (KITS) ×4 IMPLANT
KIT SHIM ACCESS MAX 4 STRL (KITS) ×3 IMPLANT
KIT SHIM MAXCESS (KITS) ×1
KIT SUCTION CATH 14FR (SUCTIONS) ×12 IMPLANT
KIT VASOVIEW HEMOPRO VH 3000 (KITS) ×4 IMPLANT
LEAD PACING MYOCARDI (MISCELLANEOUS) ×4 IMPLANT
LINE VENT (MISCELLANEOUS) ×4 IMPLANT
MARKER GRAFT CORONARY BYPASS (MISCELLANEOUS) ×12 IMPLANT
NS IRRIG 1000ML POUR BTL (IV SOLUTION) ×20 IMPLANT
PACK E OPEN HEART (SUTURE) ×4 IMPLANT
PACK OPEN HEART (CUSTOM PROCEDURE TRAY) ×4 IMPLANT
PAD ARMBOARD 7.5X6 YLW CONV (MISCELLANEOUS) ×8 IMPLANT
PAD ELECT DEFIB RADIOL ZOLL (MISCELLANEOUS) ×4 IMPLANT
PENCIL BUTTON HOLSTER BLD 10FT (ELECTRODE) ×4 IMPLANT
PUNCH AORTIC ROTATE 4.0MM (MISCELLANEOUS) IMPLANT
PUNCH AORTIC ROTATE 4.5MM 8IN (MISCELLANEOUS) ×4 IMPLANT
PUNCH AORTIC ROTATE 5MM 8IN (MISCELLANEOUS) IMPLANT
SET CARDIOPLEGIA MPS 5001102 (MISCELLANEOUS) ×4 IMPLANT
SOLUTION ANTI FOG 6CC (MISCELLANEOUS) ×4 IMPLANT
SPONGE LAP 18X18 X RAY DECT (DISPOSABLE) ×8 IMPLANT
SPONGE LAP 4X18 X RAY DECT (DISPOSABLE) ×4 IMPLANT
SUT BONE WAX W31G (SUTURE) ×4 IMPLANT
SUT ETHIBOND X763 2 0 SH 1 (SUTURE) ×8 IMPLANT
SUT MNCRL AB 3-0 PS2 18 (SUTURE) ×8 IMPLANT
SUT MNCRL AB 4-0 PS2 18 (SUTURE) ×8 IMPLANT
SUT PDS AB 1 CTX 36 (SUTURE) ×8 IMPLANT
SUT PROLENE 2 0 SH DA (SUTURE) IMPLANT
SUT PROLENE 3 0 SH DA (SUTURE) ×4 IMPLANT
SUT PROLENE 3 0 SH1 36 (SUTURE) ×8 IMPLANT
SUT PROLENE 4 0 RB 1 (SUTURE)
SUT PROLENE 4 0 SH DA (SUTURE) ×4 IMPLANT
SUT PROLENE 4-0 RB1 .5 CRCL 36 (SUTURE) IMPLANT
SUT PROLENE 5 0 C 1 36 (SUTURE) IMPLANT
SUT PROLENE 6 0 C 1 30 (SUTURE) ×8 IMPLANT
SUT PROLENE 7 0 BV1 MDA (SUTURE) ×4 IMPLANT
SUT PROLENE 7.0 RB 3 (SUTURE) ×32 IMPLANT
SUT PROLENE 8 0 BV175 6 (SUTURE) ×8 IMPLANT
SUT PROLENE BLUE 7 0 (SUTURE) ×4 IMPLANT
SUT PROLENE POLY MONO (SUTURE) ×4 IMPLANT
SUT SILK  1 MH (SUTURE) ×2
SUT SILK 1 MH (SUTURE) ×6 IMPLANT
SUT SILK 2 0SH CR/8 30 (SUTURE) ×4 IMPLANT
SUT SILK 3 0SH CR/8 30 (SUTURE) ×4 IMPLANT
SUT STEEL 6MS V (SUTURE) IMPLANT
SUT STEEL STERNAL CCS#1 18IN (SUTURE) IMPLANT
SUT STEEL SZ 6 DBL 3X14 BALL (SUTURE) IMPLANT
SUT VIC AB 1 CTX 18 (SUTURE) ×4 IMPLANT
SUT VIC AB 1 CTX 36 (SUTURE) ×3
SUT VIC AB 1 CTX36XBRD ANBCTR (SUTURE) ×9 IMPLANT
SUT VIC AB 2-0 CT1 27 (SUTURE) ×1
SUT VIC AB 2-0 CT1 TAPERPNT 27 (SUTURE) ×3 IMPLANT
SUT VIC AB 2-0 CTX 27 (SUTURE) ×8 IMPLANT
SUT VIC AB 2-0 UR6 27 (SUTURE) ×8 IMPLANT
SUT VIC AB 3-0 SH 27 (SUTURE)
SUT VIC AB 3-0 SH 27X BRD (SUTURE) IMPLANT
SUT VIC AB 3-0 SH 8-18 (SUTURE) ×12 IMPLANT
SUT VIC AB 3-0 X1 27 (SUTURE) IMPLANT
SUT VICRYL 2 TP 1 (SUTURE) ×4 IMPLANT
SUT VICRYL 4-0 PS2 18IN ABS (SUTURE) IMPLANT
SYR 10ML LL (SYRINGE) ×4 IMPLANT
SYSTEM SAHARA CHEST DRAIN ATS (WOUND CARE) ×8 IMPLANT
TAPE CLOTH SURG 4X10 WHT LF (GAUZE/BANDAGES/DRESSINGS) ×4 IMPLANT
TAPE PAPER 3X10 WHT MICROPORE (GAUZE/BANDAGES/DRESSINGS) ×4 IMPLANT
TOWEL GREEN STERILE (TOWEL DISPOSABLE) ×4 IMPLANT
TOWEL GREEN STERILE FF (TOWEL DISPOSABLE) ×4 IMPLANT
TRAY FOLEY SILVER 14FR TEMP (SET/KITS/TRAYS/PACK) ×4 IMPLANT
TRAY FOLEY SILVER 16FR TEMP (SET/KITS/TRAYS/PACK) IMPLANT
TROCAR XCEL BLADELESS 5X75MML (TROCAR) ×8 IMPLANT
TUBING INSUFFLATION (TUBING) ×4 IMPLANT
TUNNELER SHEATH ON-Q 11GX8 DSP (PAIN MANAGEMENT) IMPLANT
UNDERPAD 30X30 (UNDERPADS AND DIAPERS) ×4 IMPLANT
VENT LEFT HEART 12002 (CATHETERS) ×4
WATER STERILE IRR 1000ML POUR (IV SOLUTION) ×8 IMPLANT

## 2017-03-31 NOTE — Anesthesia Procedure Notes (Signed)
Central Venous Catheter Insertion Performed by: Heather RobertsSinger, Misty Rago, MD, anesthesiologist Start/End1/10/2017 7:42 AM, 03/31/2017 7:53 AM Patient location: Pre-op. Preanesthetic checklist: patient identified, IV checked, site marked, risks and benefits discussed, surgical consent, monitors and equipment checked, pre-op evaluation, timeout performed and anesthesia consent Position: Trendelenburg Lidocaine 1% used for infiltration and patient sedated Hand hygiene performed , maximum sterile barriers used  and Seldinger technique used Catheter size: 9 Fr Total catheter length 8. PA cath was placed.Sheath introducer Swan type:thermodilution PA Cath depth:50 Procedure performed using ultrasound guided technique. Ultrasound Notes:anatomy identified, needle tip was noted to be adjacent to the nerve/plexus identified, no ultrasound evidence of intravascular and/or intraneural injection and image(s) printed for medical record Attempts: 1 Following insertion, line sutured, dressing applied and Biopatch. Post procedure assessment: free fluid flow, blood return through all ports and no air  Patient tolerated the procedure well with no immediate complications.

## 2017-03-31 NOTE — Progress Notes (Signed)
  Echocardiogram Echocardiogram Transesophageal has been performed.  Janalyn HarderWest, Kiet Geer R 03/31/2017, 10:43 AM

## 2017-03-31 NOTE — Transfer of Care (Signed)
Immediate Anesthesia Transfer of Care Note  Patient: Ryan Durham  Procedure(s) Performed: CORONARY ARTERY BYPASS GRAFTING (CABG) time 5 using bilateral saphaneous vien, harvested endoscopicly and right internal mammary artery. (N/A Chest) CLIPPING OF ATRIAL APPENDAGE (Left Chest) TRANSESOPHAGEAL ECHOCARDIOGRAM (TEE) (N/A )  Patient Location: SICU  Anesthesia Type:General  Level of Consciousness: Patient remains intubated per anesthesia plan  Airway & Oxygen Therapy: Patient remains intubated per anesthesia plan  Post-op Assessment: Report given to RN and Post -op Vital signs reviewed and stable  Post vital signs: Reviewed and stable  Last Vitals:  Vitals:   03/31/17 0600 03/31/17 0700  BP: 113/83 125/79  Pulse: 94 (!) 139  Resp: 14 15  Temp: 37.5 C 37.5 C  SpO2: 91% 98%    Last Pain:  Vitals:   03/31/17 0400  TempSrc: Core  PainSc:          Complications: No apparent anesthesia complications

## 2017-03-31 NOTE — Anesthesia Procedure Notes (Signed)
Arterial Line Insertion Start/End1/10/2017 7:42 AM Performed by: Dorie RankQuinn, Qamar Aughenbaugh M, CRNA, CRNA  Patient location: OR. Preanesthetic checklist: patient identified, IV checked, site marked, risks and benefits discussed, surgical consent, monitors and equipment checked, pre-op evaluation and timeout performed Lidocaine 1% used for infiltration Left, radial was placed Catheter size: 20 G Hand hygiene performed , maximum sterile barriers used  and Seldinger technique used Allen's test indicative of satisfactory collateral circulation Attempts: 1 Procedure performed without using ultrasound guided technique. Ultrasound Notes:anatomy identified, needle tip was noted to be adjacent to the nerve/plexus identified and no ultrasound evidence of intravascular and/or intraneural injection Following insertion, dressing applied. Post procedure assessment: normal  Patient tolerated the procedure well with no immediate complications.

## 2017-03-31 NOTE — Addendum Note (Signed)
Addendum  created 03/31/17 1807 by Dairl PonderJiang, Christie Copley, CRNA   Intraprocedure Event edited, Intraprocedure Meds edited

## 2017-03-31 NOTE — Progress Notes (Signed)
Recruitment maneuver performed x2 minutes per MD request. Pt stable throughout with no complications. VS within normal limits

## 2017-03-31 NOTE — Progress Notes (Signed)
Patient ID: Ryan Durham, male   DOB: 12/07/1955, 62 y.o.   MRN: [id number]   TCTS Evening Rounds:   Hemodynamically stable  CI = 1.8 on milrinone 0.3, dop 3, neo,  IABP  Asleep  on vent.   Urine output good  CT output low  CBC    Component Value Date/Time   WBC 18.4 (H) 03/31/2017 1457   RBC 3.61 (L) 03/31/2017 1457   HGB 11.7 (L) 03/31/2017 1457   HCT 35.9 (L) 03/31/2017 1457   PLT 137 (L) 03/31/2017 1457   MCV 99.4 03/31/2017 1457   MCH 32.4 03/31/2017 1457   MCHC 32.6 03/31/2017 1457   RDW 13.8 03/31/2017 1457   LYMPHSABS 2.8 03/29/2017 2125   MONOABS 0.6 03/29/2017 2125   EOSABS 0.1 03/29/2017 2125   BASOSABS 0.1 03/29/2017 2125     BMET    Component Value Date/Time   NA 140 03/31/2017 1440   K 4.2 03/31/2017 1440   CL 102 03/31/2017 1259   CO2 25 03/31/2017 0528   GLUCOSE 132 (H) 03/31/2017 1440   BUN 21 (H) 03/31/2017 1259   CREATININE 0.80 03/31/2017 1259   CALCIUM 8.8 (L) 03/31/2017 0528   GFRNONAA >60 03/31/2017 0528   GFRAA >60 03/31/2017 0528     A/P:  Stable postop course. Continue current plans.

## 2017-03-31 NOTE — Anesthesia Procedure Notes (Signed)
Procedure Name: Intubation Date/Time: 03/31/2017 7:38 AM Performed by: Clearnce Sorrel, CRNA Pre-anesthesia Checklist: Patient identified, Emergency Drugs available, Suction available, Patient being monitored and Timeout performed Patient Re-evaluated:Patient Re-evaluated prior to induction Oxygen Delivery Method: Circle system utilized Preoxygenation: Pre-oxygenation with 100% oxygen Induction Type: IV induction Ventilation: Mask ventilation without difficulty Laryngoscope Size: Mac, 3, Miller and 2 Grade View: Grade II Tube type: Subglottic suction tube Tube size: 8.0 mm Number of attempts: 2 Airway Equipment and Method: Stylet Placement Confirmation: positive ETCO2,  breath sounds checked- equal and bilateral and ETT inserted through vocal cords under direct vision Secured at: 24 cm Tube secured with: Tape Dental Injury: Teeth and Oropharynx as per pre-operative assessment

## 2017-03-31 NOTE — Progress Notes (Signed)
Dr. Laneta SimmersBartle paged for MAPs in low 60's. On Neo at 90 mcg and despite x1 albumin. Orders to start Levophed.

## 2017-03-31 NOTE — Anesthesia Postprocedure Evaluation (Signed)
Anesthesia Post Note  Patient: Ryan Durham  Procedure(s) Performed: CORONARY ARTERY BYPASS GRAFTING (CABG) time 5 using bilateral saphaneous vien, harvested endoscopicly and right internal mammary artery. (N/A Chest) CLIPPING OF ATRIAL APPENDAGE (Left Chest) TRANSESOPHAGEAL ECHOCARDIOGRAM (TEE) (N/A )     Patient location during evaluation: SICU Anesthesia Type: General Level of consciousness: sedated Pain management: pain level controlled Vital Signs Assessment: post-procedure vital signs reviewed and stable Respiratory status: patient remains intubated per anesthesia plan Cardiovascular status: stable Postop Assessment: no apparent nausea or vomiting Anesthetic complications: no    Last Vitals:  Vitals:   03/31/17 0700 03/31/17 1432  BP: 125/79 (!) 104/48  Pulse: (!) 139 92  Resp: 15 14  Temp: 37.5 C   SpO2: 98% 97%    Last Pain:  Vitals:   03/31/17 0400  TempSrc: Core  PainSc:                  Kahiau Schewe DANIEL

## 2017-03-31 NOTE — Plan of Care (Signed)
Ready for CABG this morning.

## 2017-03-31 NOTE — Op Note (Signed)
CARDIOTHORACIC SURGERY OPERATIVE NOTE  Date of Procedure: 03/31/2017  Preoperative Diagnosis:   Severe 3-vessel Coronary Artery Disease  S/P Acute ST Segment Elevation Myocardial Infarction  Cardiogenic Shock  Ischemic Cardiomyopathy with Acute on Chronic Systolic Congestive Heart Failure  Paroxysmal Atrial Fibrillation  Postoperative Diagnosis: Same  Procedure:    Coronary Artery Bypass Grafting x 5   Left Internal Mammary Artery to Distal Left Anterior Descending Coronary Artery  Saphenous Vein Graft to Posterior Descending Coronary Artery  Sequential Saphenous Vein Graft to Posterolateral Branch of Distal Right Coronary Artery  Saphenous Vein Graft to Distal Left Circumflex Coronary Artery  Sapheonous Vein Graft to Intermediate Branch Coronary Artery  Endoscopic Vein Harvest from Bilateral Thighs and Left Lower Leg  Clipping of Left Atrial Appendage  Surgeon: Salvatore Decent. Cornelius Moras, MD  Assistant: Lowella Dandy, [name]  Anesthesia: Heather Roberts, MD  Operative Findings:  Severe left ventricular systolic dysfunction  Good quality left internal mammary artery conduit  Good quality saphenous vein conduit  Fairly good quality target vessels for grafting with exception of left anterior descending and distal left circumflex coronary arteries    BRIEF CLINICAL NOTE AND INDICATIONS FOR SURGERY  Patient is a 61 year old male with no previous history of coronary artery disease but risk factors notable for long-standing tobacco abuse, hyperlipidemia, and a strong family history of coronary artery disease.  The patient states that approximately 3 years ago he had a prolonged episode of chest discomfort associated with shortness of breath for which he never went to see a physician.  The symptoms lasted for many hours and gradually subsided.  More recently he has remained in his usual state of health although he admits that he gets short of breath with more strenuous physical exertion.   Yesterday at work he developed sudden onset of severe chest tightness associated with shortness of breath for which he presented to the emergency department.  On arrival he was noted in rapid atrial fibrillation with acute hypoxemic respiratory failure and cardiogenic shock.  He spontaneously converted to sinus rhythm with an EKG revealed diffuse ST changes suspicious for acute myocardial infarction.  He underwent diagnostic cardiac catheterization demonstrating critical three-vessel coronary artery disease with chronic occlusion of the proximal left anterior descending coronary artery and high-grade proximal stenosis both the left circumflex and the right coronary arteries.  Left ventricular end-diastolic pressure was elevated.  Intra-aortic balloon pump was placed in the patient's symptoms and cardiogenic shock resolved.  Follow-up echocardiogram performed earlier today demonstrates severe left ventricular systolic dysfunction.  There was akinesis of the distal anterior wall and apex as well as akinesis of the mid apical lateral myocardium.  Ejection fraction was estimated 25-30%.  The aortic valve appeared normal.  There was no significant mitral regurgitation.  The patient has been seen in consultation and counseled at length regarding the indications, risks and potential benefits of surgery.  All questions have been answered, and the patient provides full informed consent for the operation as described.     DETAILS OF THE OPERATIVE PROCEDURE  Preparation:  The patient is brought to the operating room on the above mentioned date and central monitoring was established by the anesthesia team including placement of Swan-Ganz catheter and radial arterial line. The patient is placed in the supine position on the operating table.  Intravenous antibiotics are administered. General endotracheal anesthesia is induced uneventfully. A Foley catheter is placed.  Baseline transesophageal echocardiogram was  performed.  Findings were notable for severe global function.  There was akinesis  of the distal anterior wall, apex, and distal anterolateral wall.  There was hypokinesis of the inferior wall and lateral wall.  The aortic valve appears normal.  Mitral valve appeared normal.  There was trace central mitral regurgitation.  Right ventricular size and function appeared normal.  The patient's chest, abdomen, both groins, and both lower extremities are prepared and draped in a sterile manner. A time out procedure is performed.   Surgical Approach and Conduit Harvest:  A median sternotomy incision was performed and the left internal mammary artery is dissected from the chest wall and prepared for bypass grafting. The left internal mammary artery is notably good quality conduit. Simultaneously, the greater saphenous vein is obtained from the patient's left thigh and lower leg using endoscopic vein harvest technique. The saphenous vein is notably good quality conduit, although the portion of vein obtained from below the knee was too small for grafting.  Subsequently an additional segment of greater saphenous vein pain in the patient's right endoscopic vein harvest technique.  Saphenous vein from the right thigh is good quality conduit.. After removal of the saphenous vein, the small surgical incisions in the lower extremity are closed with absorbable suture. Following systemic heparinization, the left internal mammary artery was transected distally noted to have excellent flow.   Extracorporeal Cardiopulmonary Bypass and Myocardial Protection:  The pericardium is opened. The ascending aorta is normal in appearance. The ascending aorta and the right atrium are cannulated for cardiopulmonary bypass.  Adequate heparinization is verified.    A retrograde cardioplegia cannula is placed through the right atrium into the coronary sinus.  The entire pre-bypass portion of the operation was notable for stable  hemodynamics.  Cardiopulmonary bypass was begun and the surface of the heart is inspected.  There is a large amount of diffuse scar throughout the distal portion of anterior wall and apex consistent with remote history of massive transmural myocardial infarction in the distribution of the left anterior descending coronary artery.  Distal target vessels are selected for coronary artery bypass grafting.  Due to the size of the left ventricle left ventricular vent is placed to the right superior pulmonary vein to improve exposure of the terminal branches of the left circumflex coronary artery.  A cardioplegia cannula is placed in the ascending aorta.  A temperature probe was placed in the interventricular septum.  The patient is allowed to cool passively to [facility] systemic temperature.  The aortic cross clamp is applied and cold blood cardioplegia is delivered initially in an antegrade fashion through the aortic root.   Supplemental cardioplegia is given retrograde through the coronary sinus catheter.  Iced saline slush is applied for topical hypothermia.  The initial cardioplegic arrest is rapid with early diastolic arrest.  Repeat doses of cardioplegia are administered intermittently throughout the entire cross clamp portion of the operation through the aortic root, through the coronary sinus catheter, and through subsequently placed vein grafts in order to maintain completely flat electrocardiogram and septal myocardial temperature below 15C.  Myocardial protection was felt to be excellent.   Coronary Artery Bypass Grafting:   The posterior descending branch of the right coronary artery was grafted using a reversed saphenous vein graft in an side-to-side fashion.  At the site of distal anastomosis the target vessel was good quality and measured approximately 2.0 mm in diameter.  The posterolateral branch of the distal right coronary artery was grafted in and end-to-side fashion using a sequential  saphenous vein graft off of the distal segment of the  vein graft placed to the posterior descending coronary artery.  At the site of distal anastomosis the target vessel was good quality and measured approximately 1.7 mm in diameter.  The distal left circumflex coronary artery was grafted using a reversed saphenous vein graft in an end-to-side fashion.  At the site of distal anastomosis the target vessel was poor quality and measured approximately 1.2 mm in diameter.  The ramus intermediate branch coronary artery was grafted using a reversed saphenous vein graft in an end-to-side fashion.  At the site of distal anastomosis the target vessel was good quality and measured approximately 2.0 mm in diameter.  The distal left anterior coronary artery was grafted with the left internal mammary artery in an end-to-side fashion.  At the site of distal anastomosis the target vessel was poor quality and measured approximately 1.0 mm in diameter.     Clipping of Left Atrial Appendage:  Left atrial appendage was obliterated using an epicardial clip (Atricure Pro245, size 40 mm) under direct vision prior to removal of the aortic cross-clamp.   Procedure Completion:  All proximal vein graft anastomoses were placed directly to the ascending aorta prior to removal of the aortic cross clamp.  The septal myocardial temperature rose appropriately after reperfusion of the left internal mammary artery graft.  The aortic cross clamp was removed after a total cross clamp time of 111 minutes.  All proximal and distal coronary anastomoses were inspected for hemostasis and appropriate graft orientation. Epicardial pacing wires are fixed to the right ventricular outflow tract and to the right atrial appendage. The patient is rewarmed to 37C temperature. The patient is weaned and disconnected from cardiopulmonary bypass.  The patient's rhythm at separation from bypass was sinus.  The patient was weaned from cardiopulmonary  bypass on low dose milrinone and dopamine infusions with intraaortic balloon pump at 1:1. Total cardiopulmonary bypass time for the operation was 148 minutes.  Followup transesophageal echocardiogram performed after separation from bypass revealed no changes from the preoperative exam although contractility in the inferior and lateral walls appeared somewhat improved.  The aortic and venous cannula were removed uneventfully. Protamine was administered to reverse the anticoagulation. The mediastinum and pleural space were inspected for hemostasis and irrigated with saline solution. The mediastinum and the left pleural space were drained using 3 chest tubes placed through separate stab incisions inferiorly.  The soft tissues anterior to the aorta were reapproximated loosely. The sternum is closed with double strength sternal wire. The soft tissues anterior to the sternum were closed in multiple layers and the skin is closed with a running subcuticular skin closure.  The post-bypass portion of the operation was notable for stable rhythm and hemodynamics.  The patient received a total of 1 packs adult platelets and 2 units fresh frozen plasma due to coagulopathy and thrombocytopenia after separation from cardiopulmonary bypass and reversal of heparin with protamine.   Disposition:  The patient tolerated the procedure well and is transported to the surgical intensive care in stable condition. There are no intraoperative complications. All sponge instrument and needle counts are verified correct at completion of the operation.    Salvatore Decentlarence H. Cornelius Moraswen MD 03/31/2017 2:02 PM

## 2017-03-31 NOTE — Brief Op Note (Signed)
03/29/2017 - 03/31/2017  2:02 PM  PATIENT:  Ryan Durham  62 y.o. male  PRE-OPERATIVE DIAGNOSIS:  CAD  POST-OPERATIVE DIAGNOSIS:  CAD  PROCEDURE:  Procedure(s):  CORONARY ARTERY BYPASS GRAFTING  X 5 -LIMA to LAD -SVG to RAMUS INTERMEDIATE -SVG to DISTAL CIRCUMFLEX -SEQ SVG to PDA and PLVB  ENDOSCOPIC HARVEST GREATER SAPHENOUS VEIN -Left Leg -Right Thigh  CLIPPING OF ATRIAL APPENDAGE (Left) -40 mm Atricure Pro Clip 2  TRANSESOPHAGEAL ECHOCARDIOGRAM (TEE) (N/A)  SURGEON:  Surgeon(s) and Role:    Purcell Nails* Owen, Clarence H, MD - Primary  PHYSICIAN ASSISTANT: Lowella DandyErin Barrett [name]  ASSISTANTS: Virgilio FreesKim Hardy RNFA   ANESTHESIA:   general  EBL:  1000 mL   BLOOD ADMINISTERED: CELLSAVER  DRAINS: Left Pleural Chest Tube   LOCAL MEDICATIONS USED:  NONE  SPECIMEN:  No Specimen  DISPOSITION OF SPECIMEN:  N/A  COUNTS:  YES  TOURNIQUET:  * No tourniquets in log *  DICTATION: .Dragon Dictation  PLAN OF CARE: Admit to inpatient   PATIENT DISPOSITION:  ICU - intubated and hemodynamically stable.   Delay start of Pharmacological VTE agent (>24hrs) due to surgical blood loss or risk of bleeding: yes

## 2017-03-31 NOTE — Progress Notes (Signed)
      [address]       [place] [id number]             [phone]        CARDIOTHORACIC SURGERY PROGRESS NOTE   R2 Days Post-Op Procedure(s) (LRB): Coronary/Graft Acute MI Revascularization (N/A) LEFT HEART CATH AND CORONARY ANGIOGRAPHY (N/A) IABP Insertion (N/A)  Subjective: Stable overnight.  Denies chest pain, SOB  Objective: Vital signs: BP Readings from Last 1 Encounters:  03/31/17 113/83   Pulse Readings from Last 1 Encounters:  03/31/17 94   Resp Readings from Last 1 Encounters:  03/31/17 14   Temp Readings from Last 1 Encounters:  03/31/17 99.5 F (37.5 C)    Hemodynamics:    Physical Exam:  Rhythm:   sinus  Breath sounds: Clear - wheezing has resolved  Heart sounds:  RRR  Incisions:  n/a  Abdomen:  soft  Extremities:  Warm, well perfused   Intake/Output from previous day: 01/07 0701 - 01/08 0700 In: 716.8 [P.O.:65; I.V.:651.8] Out: 2225 [Urine:2225] Intake/Output this shift: Total I/O In: 255.2 [I.V.:255.2] Out: 785 [Urine:785]  Lab Results:  CBC: Recent Labs    03/29/17 2125 03/29/17 2141 03/30/17 0249  WBC 17.1*  --  16.2*  HGB 17.8* 18.7* 18.3*  HCT 51.5 55.0* 52.9*  PLT 190  --  178    BMET:  Recent Labs    03/30/17 0249 03/30/17 1525  NA 134* 136  K 5.2* 4.4  CL 102 102  CO2 20* 22  GLUCOSE 284* 164*  BUN 15 17  CREATININE 0.96 0.96  CALCIUM 9.4 8.8*     PT/INR:   Recent Labs    03/30/17 1525  LABPROT 14.3  INR 1.12    CBG (last 3)  Recent Labs    03/30/17 2028 03/31/17 0037 03/31/17 0433  GLUCAP 216* 187* 176*    ABG    Component Value Date/Time   PHART 7.378 03/30/2017 1453   PCO2ART 43.6 03/30/2017 1453   PO2ART 291.0 (H) 03/30/2017 1453   HCO3 25.7 03/30/2017 1453   TCO2 27 03/30/2017 1453   O2SAT 100.0 03/30/2017 1453    CXR: CHEST 1 VIEW  COMPARISON:  Portable chest x-ray of March 29, 2016  FINDINGS: The intra-aortic balloon pump marker projects over the  midportion of the aortic arch. The lungs are adequately inflated. The interstitial markings are less prominent today. The cardiac silhouette remains mildly enlarged. The pulmonary vascularity is less engorged and more distinct. The observed bony thorax exhibits no acute abnormality.  IMPRESSION: Decreased pulmonary interstitial edema and pulmonary vascular congestion since intra aortic balloon pump placement. The radiodense tip projects over the midportion of the silhouette of the aortic arch.   Electronically Signed   By: David  SwazilandJordan M.D.   On: 03/30/2017 08:56   Assessment/Plan: S/P Procedure(s) (LRB): Coronary/Graft Acute MI Revascularization (N/A) LEFT HEART CATH AND CORONARY ANGIOGRAPHY (N/A) IABP Insertion (N/A)  For OR this morning.  All questions answered  Purcell Nailslarence H Owen, MD 03/31/2017 6:48 AM

## 2017-04-01 ENCOUNTER — Encounter (HOSPITAL_COMMUNITY): Payer: Self-pay | Admitting: Thoracic Surgery (Cardiothoracic Vascular Surgery)

## 2017-04-01 ENCOUNTER — Inpatient Hospital Stay (HOSPITAL_COMMUNITY): Payer: 59

## 2017-04-01 DIAGNOSIS — Z951 Presence of aortocoronary bypass graft: Secondary | ICD-10-CM

## 2017-04-01 LAB — CBC
HCT: 31.4 % — ABNORMAL LOW (ref 39.0–52.0)
HEMATOCRIT: 33.3 % — AB (ref 39.0–52.0)
HEMOGLOBIN: 10.7 g/dL — AB (ref 13.0–17.0)
Hemoglobin: 11 g/dL — ABNORMAL LOW (ref 13.0–17.0)
MCH: 32.3 pg (ref 26.0–34.0)
MCH: 33.8 pg (ref 26.0–34.0)
MCHC: 33 g/dL (ref 30.0–36.0)
MCHC: 34.1 g/dL (ref 30.0–36.0)
MCV: 97.7 fL (ref 78.0–100.0)
MCV: 99.1 fL (ref 78.0–100.0)
Platelets: 130 10*3/uL — ABNORMAL LOW (ref 150–400)
Platelets: 98 10*3/uL — ABNORMAL LOW (ref 150–400)
RBC: 3.41 MIL/uL — ABNORMAL LOW (ref 4.22–5.81)
RBC: 3.17 MIL/uL — ABNORMAL LOW (ref 4.22–5.81)
RDW: 13.6 % (ref 11.5–15.5)
RDW: 14 % (ref 11.5–15.5)
WBC: 15.6 10*3/uL — ABNORMAL HIGH (ref 4.0–10.5)
WBC: 17.4 10*3/uL — ABNORMAL HIGH (ref 4.0–10.5)

## 2017-04-01 LAB — POCT I-STAT 3, ART BLOOD GAS (G3+)
Acid-base deficit: 3 mmol/L — ABNORMAL HIGH (ref 0.0–2.0)
Acid-base deficit: 1 mmol/L (ref 0.0–2.0)
BICARBONATE: 23.4 mmol/L (ref 20.0–28.0)
Bicarbonate: 21.2 mmol/L (ref 20.0–28.0)
O2 Saturation: 96 %
O2 Saturation: 96 %
PCO2 ART: 36.4 mmHg (ref 32.0–48.0)
PCO2 ART: 37.9 mmHg (ref 32.0–48.0)
PH ART: 7.377 (ref 7.350–7.450)
PH ART: 7.402 (ref 7.350–7.450)
PO2 ART: 83 mmHg (ref 83.0–108.0)
Patient temperature: 37.9
Patient temperature: 37.7
TCO2: 22 mmol/L (ref 22–32)
TCO2: 25 mmol/L (ref 22–32)
pO2, Arterial: 84 mmHg (ref 83.0–108.0)

## 2017-04-01 LAB — BLOOD GAS, ARTERIAL
Acid-base deficit: 0.4 mmol/L (ref 0.0–2.0)
Bicarbonate: 23.6 mmol/L (ref 20.0–28.0)
Drawn by: 36496
FIO2: 50
MECHVT: 680 mL
O2 Saturation: 97.8 %
PATIENT TEMPERATURE: 98.6
PCO2 ART: 38.3 mmHg (ref 32.0–48.0)
PEEP: 5 cmH2O
PH ART: 7.407 (ref 7.350–7.450)
PO2 ART: 100 mmHg (ref 83.0–108.0)
RATE: 12 resp/min

## 2017-04-01 LAB — PREPARE FRESH FROZEN PLASMA
Unit division: 0
Unit division: 0

## 2017-04-01 LAB — BPAM FFP
BLOOD PRODUCT EXPIRATION DATE: 201901132359
BLOOD PRODUCT EXPIRATION DATE: 201901132359
ISSUE DATE / TIME: 201901081208
ISSUE DATE / TIME: 201901081208
UNIT TYPE AND RH: 600
Unit Type and Rh: 6200

## 2017-04-01 LAB — GLUCOSE, CAPILLARY
GLUCOSE-CAPILLARY: 107 mg/dL — AB (ref 65–99)
GLUCOSE-CAPILLARY: 111 mg/dL — AB (ref 65–99)
GLUCOSE-CAPILLARY: 114 mg/dL — AB (ref 65–99)
GLUCOSE-CAPILLARY: 120 mg/dL — AB (ref 65–99)
GLUCOSE-CAPILLARY: 130 mg/dL — AB (ref 65–99)
GLUCOSE-CAPILLARY: 139 mg/dL — AB (ref 65–99)
Glucose-Capillary: 162 mg/dL — ABNORMAL HIGH (ref 65–99)
Glucose-Capillary: 126 mg/dL — ABNORMAL HIGH (ref 65–99)
Glucose-Capillary: 131 mg/dL — ABNORMAL HIGH (ref 65–99)
Glucose-Capillary: 125 mg/dL — ABNORMAL HIGH (ref 65–99)
Glucose-Capillary: 120 mg/dL — ABNORMAL HIGH (ref 65–99)
Glucose-Capillary: 137 mg/dL — ABNORMAL HIGH (ref 65–99)
Glucose-Capillary: 121 mg/dL — ABNORMAL HIGH (ref 65–99)
Glucose-Capillary: 112 mg/dL — ABNORMAL HIGH (ref 65–99)
Glucose-Capillary: 102 mg/dL — ABNORMAL HIGH (ref 65–99)
Glucose-Capillary: 114 mg/dL — ABNORMAL HIGH (ref 65–99)
Glucose-Capillary: 197 mg/dL — ABNORMAL HIGH (ref 65–99)

## 2017-04-01 LAB — BPAM PLATELET PHERESIS
Blood Product Expiration Date: 201901102359
ISSUE DATE / TIME: 201901081212
Unit Type and Rh: 8400

## 2017-04-01 LAB — CREATININE, SERUM
CREATININE: 1 mg/dL (ref 0.61–1.24)
GFR calc Af Amer: >60 mL/min (ref >60)
GFR calc non Af Amer: >60 mL/min (ref >60)

## 2017-04-01 LAB — BASIC METABOLIC PANEL
Anion gap: 7 (ref 5–15)
BUN: 21 mg/dL — AB (ref 6–20)
CALCIUM: 7.8 mg/dL — AB (ref 8.9–10.3)
CO2: 23 mmol/L (ref 22–32)
CREATININE: 0.91 mg/dL (ref 0.61–1.24)
Chloride: 107 mmol/L (ref 101–111)
GFR calc Af Amer: >60 mL/min (ref >60)
GFR calc non Af Amer: >60 mL/min (ref >60)
GLUCOSE: 108 mg/dL — AB (ref 65–99)
Potassium: 4 mmol/L (ref 3.5–5.1)
Sodium: 137 mmol/L (ref 135–145)

## 2017-04-01 LAB — POCT I-STAT, CHEM 8
BUN: 23 mg/dL — ABNORMAL HIGH (ref 6–20)
CHLORIDE: 105 mmol/L (ref 101–111)
Calcium, Ion: 1.03 mmol/L — ABNORMAL LOW (ref 1.15–1.40)
Creatinine, Ser: 0.7 mg/dL (ref 0.61–1.24)
GLUCOSE: 156 mg/dL — AB (ref 65–99)
HCT: 29 % — ABNORMAL LOW (ref 39.0–52.0)
Hemoglobin: 9.9 g/dL — ABNORMAL LOW (ref 13.0–17.0)
POTASSIUM: 3.8 mmol/L (ref 3.5–5.1)
Sodium: 140 mmol/L (ref 135–145)
TCO2: 21 mmol/L — ABNORMAL LOW (ref 22–32)

## 2017-04-01 LAB — PREPARE PLATELET PHERESIS: Unit division: 0

## 2017-04-01 LAB — COOXEMETRY PANEL
CARBOXYHEMOGLOBIN: 0.9 % (ref 0.5–1.5)
Methemoglobin: 1.1 % (ref 0.0–1.5)
O2 SAT: 57.1 %
Total hemoglobin: 11.2 g/dL — ABNORMAL LOW (ref 12.0–16.0)

## 2017-04-01 LAB — MAGNESIUM
Magnesium: 2.3 mg/dL (ref 1.7–2.4)
Magnesium: 2.2 mg/dL (ref 1.7–2.4)

## 2017-04-01 LAB — POCT ACTIVATED CLOTTING TIME: ACTIVATED CLOTTING TIME: 131 s

## 2017-04-01 MED ORDER — FUROSEMIDE 10 MG/ML IJ SOLN
20.0000 mg | Freq: Once | INTRAMUSCULAR | Status: AC
  Administered 2017-04-01: 20 mg via INTRAVENOUS
  Filled 2017-04-01: qty 2

## 2017-04-01 MED ORDER — SODIUM CHLORIDE 0.9% FLUSH: 10.0000 mL | INTRAVENOUS | Status: DC | PRN

## 2017-04-01 MED ORDER — INSULIN DETEMIR 100 UNIT/ML ~~LOC~~ SOLN
20.0000 [IU] | Freq: Every day | SUBCUTANEOUS | Status: DC
  Filled 2017-04-01: qty 0.2

## 2017-04-01 MED ORDER — CHLORHEXIDINE GLUCONATE CLOTH 2 % EX PADS
6.0000 | MEDICATED_PAD | Freq: Every day | CUTANEOUS | Status: DC
  Administered 2017-04-01 – 2017-04-03 (×3): 6 via TOPICAL

## 2017-04-01 MED ORDER — ASPIRIN EC 81 MG PO TBEC
81.0000 mg | DELAYED_RELEASE_TABLET | Freq: Every day | ORAL | Status: DC
  Administered 2017-04-02 – 2017-04-09 (×8): 81 mg via ORAL
  Filled 2017-04-01 (×8): qty 1

## 2017-04-01 MED ORDER — ORAL CARE MOUTH RINSE
15.0000 mL | Freq: Two times a day (BID) | OROMUCOSAL | Status: DC
  Administered 2017-04-01 – 2017-04-08 (×10): 15 mL via OROMUCOSAL

## 2017-04-01 MED ORDER — INSULIN ASPART 100 UNIT/ML ~~LOC~~ SOLN
0.0000 [IU] | SUBCUTANEOUS | Status: DC
  Administered 2017-04-01: 2 [IU] via SUBCUTANEOUS
  Administered 2017-04-01: 4 [IU] via SUBCUTANEOUS
  Administered 2017-04-02: 8 [IU] via SUBCUTANEOUS
  Administered 2017-04-02: 4 [IU] via SUBCUTANEOUS
  Administered 2017-04-02: 8 [IU] via SUBCUTANEOUS
  Administered 2017-04-02 (×3): 4 [IU] via SUBCUTANEOUS
  Administered 2017-04-03 (×3): 2 [IU] via SUBCUTANEOUS

## 2017-04-01 MED ORDER — INSULIN DETEMIR 100 UNIT/ML ~~LOC~~ SOLN
20.0000 [IU] | Freq: Once | SUBCUTANEOUS | Status: AC
  Administered 2017-04-01: 20 [IU] via SUBCUTANEOUS
  Filled 2017-04-01: qty 0.2

## 2017-04-01 MED ORDER — SODIUM CHLORIDE 0.9% FLUSH
10.0000 mL | Freq: Two times a day (BID) | INTRAVENOUS | Status: DC
  Administered 2017-04-01 – 2017-04-07 (×5): 10 mL

## 2017-04-01 MED ORDER — ENOXAPARIN SODIUM 40 MG/0.4ML ~~LOC~~ SOLN: 40.0000 mg | Freq: Every day | SUBCUTANEOUS | Status: DC

## 2017-04-01 MED FILL — Sodium Bicarbonate IV Soln 8.4%: INTRAVENOUS | Qty: 50 | Status: AC

## 2017-04-01 MED FILL — Mannitol IV Soln 20%: INTRAVENOUS | Qty: 500 | Status: AC

## 2017-04-01 MED FILL — Lidocaine HCl IV Inj 20 MG/ML: INTRAVENOUS | Qty: 5 | Status: AC

## 2017-04-01 MED FILL — Sodium Chloride IV Soln 0.9%: INTRAVENOUS | Qty: 2000 | Status: AC

## 2017-04-01 MED FILL — Heparin Sodium (Porcine) Inj 1000 Unit/ML: INTRAMUSCULAR | Qty: 20 | Status: AC

## 2017-04-01 MED FILL — Calcium Chloride Inj 10%: INTRAVENOUS | Qty: 10 | Status: AC

## 2017-04-01 MED FILL — Heparin Sodium (Porcine) Inj 1000 Unit/ML: INTRAMUSCULAR | Qty: 30 | Status: AC

## 2017-04-01 MED FILL — Potassium Chloride Inj 2 mEq/ML: INTRAVENOUS | Qty: 40 | Status: AC

## 2017-04-01 MED FILL — Magnesium Sulfate Inj 50%: INTRAMUSCULAR | Qty: 10 | Status: AC

## 2017-04-01 MED FILL — Electrolyte-R (PH 7.4) Solution: INTRAVENOUS | Qty: 5000 | Status: AC

## 2017-04-01 NOTE — Progress Notes (Addendum)
[facility] DAILY ICU PROGRESS NOTE                   [address]            [place] [address]          [phone]   1 Day Post-Op Procedure(s) (LRB): CORONARY ARTERY BYPASS GRAFTING (CABG) time 5 using bilateral saphaneous vien, harvested endoscopicly and right internal mammary artery. (N/A) CLIPPING OF ATRIAL APPENDAGE (Left) TRANSESOPHAGEAL ECHOCARDIOGRAM (TEE) (N/A)  Total Length of Stay:  LOS: 2 days   Subjective:  Awake on vent, follows commands, denies pain  Objective: Vital signs in last 24 hours: Temp:  [99.7 F (37.6 C)-101.3 F (38.5 C)] 100.6 F (38.1 C) (01/09 0800) Pulse Rate:  [80-213] 156 (01/09 0800) Cardiac Rhythm: Atrial paced (01/09 0800) Resp:  [7-20] 19 (01/09 0800) BP: (82-108)/(46-78) 93/55 (01/09 0800) SpO2:  [87 %-100 %] 99 % (01/09 0800) Arterial Line BP: (72-269)/(10-76) 96/76 (01/09 0800) FiO2 (%):  [50 %-70 %] 50 % (01/09 0800) Weight:  [228 lb 6.3 oz (103.6 kg)] 228 lb 6.3 oz (103.6 kg) (01/09 0500)  Filed Weights   03/30/17 0200 03/31/17 0600 04/01/17 0500  Weight: 221 lb 5.5 oz (100.4 kg) 210 lb 15.7 oz (95.7 kg) 228 lb 6.3 oz (103.6 kg)    Weight change: 17 lb 6.7 oz (7.9 kg)   Hemodynamic parameters for last 24 hours: PAP: (24-39)/(14-26) 26/15 CO:  [4 L/min-4.8 L/min] 4.7 L/min CI:  [1.8 L/min/m2-2.2 L/min/m2] 2.2 L/min/m2  Intake/Output from previous day: 01/08 0701 - 01/09 0700 In: 8174.7 [I.V.:5145.7; Blood:1429; IV [id number]:9892] Out: 1194 [Urine:1630; Blood:1000; Chest Tube:380]  Intake/Output this shift: Total I/O In: 133.7 [I.V.:83.7; IV Piggyback:50] Out: 100 [Urine:60; Chest Tube:40]  Current Meds: Scheduled Meds: . acetaminophen  1,000 mg Oral Q6H   Or  . acetaminophen (TYLENOL) oral liquid 160 mg/5 mL  1,000 mg Per Tube Q6H  . aspirin EC  325 mg Oral Daily   Or  . aspirin  324 mg Per Tube Daily  . bisacodyl  10 mg Oral Daily   Or  . bisacodyl  10 mg Rectal Daily  . chlorhexidine gluconate  (MEDLINE KIT)  15 mL Mouth Rinse BID  . docusate sodium  200 mg Oral Daily  . insulin regular  0-10 Units Intravenous TID WC  . mouth rinse  15 mL Mouth Rinse QID  . [START ON 04/02/2017] pantoprazole  40 mg Oral Daily  . sodium chloride flush  3 mL Intravenous Q12H   Continuous Infusions: . sodium chloride 20 mL/hr at 04/01/17 0700  . sodium chloride    . sodium chloride 20 mL/hr at 03/31/17 1900  . cefUROXime (ZINACEF)  IV 1.5 g (04/01/17 0757)  . dexmedetomidine (PRECEDEX) IV infusion 0.1 mcg/kg/hr (04/01/17 0700)  . DOPamine 3.009 mcg/kg/min (04/01/17 0700)  . insulin (NOVOLIN-R) infusion 6.2 Units/hr (04/01/17 0800)  . lactated ringers    . lactated ringers Stopped (03/31/17 2000)  . lactated ringers 20 mL/hr at 04/01/17 0700  . milrinone 0.3 mcg/kg/min (04/01/17 0700)  . nitroGLYCERIN    . norepinephrine (LEVOPHED) Adult infusion 5.973 mcg/min (04/01/17 0700)  . phenylephrine (NEO-SYNEPHRINE) Adult infusion 45.067 mcg/min (04/01/17 0700)   PRN Meds:.sodium chloride, lactated ringers, levalbuterol, metoprolol tartrate, midazolam, morphine injection, ondansetron (ZOFRAN) IV, oxyCODONE, sodium chloride flush, traMADol  General appearance: alert and on vent Heart: regular rate and rhythm Lungs: clear to auscultation bilaterally Abdomen: soft, non-tender; bowel sounds normal; no masses,  no organomegaly Extremities:  edema trace, Right foot is cold, + pulses present Wound: clean and dry  Lab Results: CBC: Recent Labs    03/31/17 1947 03/31/17 2003 04/01/17 0401  WBC 15.1*  --  15.6*  HGB 11.3* 11.2* 11.0*  HCT 34.3* 33.0* 33.3*  PLT 128*  --  130*   BMET:  Recent Labs    03/31/17 0528  03/31/17 2003 04/01/17 0401  NA 136   < > 140 137  K 4.1   < > 4.2 4.0  CL 100*   < > 104 107  CO2 25  --   --  23  GLUCOSE 147*   < > 134* 108*  BUN 23*   < > 22* 21*  CREATININE 1.04   < > 0.80 0.91  CALCIUM 8.8*  --   --  7.8*   < > = values in this interval not displayed.     CMET: Lab Results  Component Value Date   WBC 15.6 (H) 04/01/2017   HGB 11.0 (L) 04/01/2017   HCT 33.3 (L) 04/01/2017   PLT 130 (L) 04/01/2017   GLUCOSE 108 (H) 04/01/2017   CHOL 277 (H) 03/29/2017   TRIG 299 (H) 03/29/2017   HDL 37 (L) 03/29/2017   LDLCALC 180 (H) 03/29/2017   ALT 129 (H) 03/30/2017   AST 472 (H) 03/30/2017   NA 137 04/01/2017   K 4.0 04/01/2017   CL 107 04/01/2017   CREATININE 0.91 04/01/2017   BUN 21 (H) 04/01/2017   CO2 23 04/01/2017   TSH 4.454 03/29/2017   INR 1.31 03/31/2017   HGBA1C 6.2 (H) 03/30/2017      PT/INR:  Recent Labs    03/31/17 1457  LABPROT 16.1*  INR 1.31   Radiology: Dg Chest Port 1 View  Result Date: 03/31/2017 CLINICAL DATA:  Hypoxia EXAM: PORTABLE CHEST 1 VIEW COMPARISON:  March 31, 2017 FINDINGS: Endotracheal tube tip is 3.5 cm above the carina. Swan-Ganz catheter tip is in the right main pulmonary outflow tract distally. Nasogastric tube tip and side port are below the diaphragm. There is a left chest tube and a mediastinal drain. Intra-aortic balloon pump tip is in the proximal descending thoracic aorta, unchanged. No pneumothorax. Patient is status post coronary artery bypass grafting. There is a left atrial appendage clamp. There is mild atelectasis in the left base. The lungs elsewhere are clear. Heart is mildly enlarged with pulmonary vascularity within normal limits. No adenopathy. No bone lesions. IMPRESSION: Tube and catheter positions as described without evident pneumothorax. Left base atelectasis. Lungs elsewhere clear. Stable cardiac size and contour. Electronically Signed   By: Lowella Grip III M.D.   On: 03/31/2017 15:09     Assessment/Plan: S/P Procedure(s) (LRB): CORONARY ARTERY BYPASS GRAFTING (CABG) time 5 using bilateral saphaneous vien, harvested endoscopicly and right internal mammary artery. (N/A) CLIPPING OF ATRIAL APPENDAGE (Left) TRANSESOPHAGEAL ECHOCARDIOGRAM (TEE) (N/A)  1. CV- NSR- on  Dopamine, Neo, and Levophed... IABP remains in place, wean to 1:2, wean drips as hemodynamics allow 2. Pulm- wean to extubate today, following commands, CXR looks good, CT output 130 ml since midnight.. Will hopefully remove tomorrow 3. Renal-creatinine WNL, + hypervolemic on exam.. Hold off on Lasix for now 4. Expected post operative anemia, mild Hgb at 11.0 5. CBGs-120, will wean insulin drip, will start Levemir 6. Dispo- patient stable, wean and extubate today, wean balloon pump, leave chest tubes in place today, hold on diuretics today, stable POD #1      Erin Barrett 04/01/2017  8:35 AM    I have seen and examined the patient and agree with the assessment and plan as outlined.  Looks good.  Proceed with acute vent wean and likely extubation.  IABP to 1:2 and wean drips as tolerated.  Rexene Alberts, MD 04/01/2017 9:25 AM

## 2017-04-01 NOTE — Plan of Care (Signed)
Patient extubated today to 4L [place], IS encouraged and able to achieve .

## 2017-04-01 NOTE — Progress Notes (Signed)
      [address]       [place] [id number]             [phone]      POD # 1 CABG x 5  Resting comfortably. IABP out  BP 99/69   Pulse 87   Temp 99.5 F (37.5 C)   Resp 20   Ht 5\' 9"  (1.753 m)   Wt 228 lb 6.3 oz (103.6 kg)   SpO2 (!) 89%   BMI 33.73 kg/m   26/15 CI 2.6 on milrinone, dopamine and norepi  Intake/Output Summary (Last 24 hours) at 04/01/2017 1831 Last data filed at 04/01/2017 1800 Gross per 24 hour  Intake 4308.86 ml  Output 1365 ml  Net 2943.86 ml   K= 3.8, Hct= 29  Doing well POD # 1  Ryan Wittner C. Dorris FetchHendrickson, MD [facility] [phone]

## 2017-04-01 NOTE — Progress Notes (Signed)
2157f IABP removed from rt femoral artery at 1730.  Manual pressure applied to site for 25 min.  Rt. DP palpable post removal.  Bruising noted at the site prior to removal, no hematoma before or upon completion of the hold.  Pt. Is asleep during hold.  HR 91, BP 95/58.  No complications.  Tegaderm dressing applied to site.

## 2017-04-01 NOTE — Procedures (Signed)
Extubation Procedure Note  Patient Details:   Name: Deland PrettyVernon Luo DOB: 09-14-1955 MRN: [id number]   Airway Documentation:     Evaluation  O2 sats: stable throughout Complications: No apparent complications Patient did tolerate procedure well. Bilateral Breath Sounds: (coarse)   Yes   Patient extubated to 4lnc. Vitals stable throughout. No complications. Patient tolerating well at this time. RT will continue to monitor.   Ave Filterdkins, Bresha Hosack Williams 04/01/2017, 9:52 AM

## 2017-04-02 ENCOUNTER — Inpatient Hospital Stay (HOSPITAL_COMMUNITY): Payer: 59

## 2017-04-02 DIAGNOSIS — Z736 Limitation of activities due to disability: Secondary | ICD-10-CM

## 2017-04-02 LAB — COMPREHENSIVE METABOLIC PANEL
ALK PHOS: 56 U/L (ref 38–126)
ALT: 173 U/L — AB (ref 17–63)
AST: 201 U/L — ABNORMAL HIGH (ref 15–41)
Albumin: 3 g/dL — ABNORMAL LOW (ref 3.5–5.0)
Anion gap: 7 (ref 5–15)
BUN: 26 mg/dL — ABNORMAL HIGH (ref 6–20)
CALCIUM: 7.9 mg/dL — AB (ref 8.9–10.3)
CO2: 25 mmol/L (ref 22–32)
CREATININE: 1.09 mg/dL (ref 0.61–1.24)
Chloride: 103 mmol/L (ref 101–111)
GFR calc non Af Amer: >60 mL/min (ref >60)
GLUCOSE: 202 mg/dL — AB (ref 65–99)
Potassium: 3.9 mmol/L (ref 3.5–5.1)
Sodium: 135 mmol/L (ref 135–145)
Total Bilirubin: 1.3 mg/dL — ABNORMAL HIGH (ref 0.3–1.2)
Total Protein: 5.4 g/dL — ABNORMAL LOW (ref 6.5–8.1)

## 2017-04-02 LAB — GLUCOSE, CAPILLARY
GLUCOSE-CAPILLARY: 185 mg/dL — AB (ref 65–99)
GLUCOSE-CAPILLARY: 165 mg/dL — AB (ref 65–99)
Glucose-Capillary: 185 mg/dL — ABNORMAL HIGH (ref 65–99)
Glucose-Capillary: 194 mg/dL — ABNORMAL HIGH (ref 65–99)
Glucose-Capillary: 210 mg/dL — ABNORMAL HIGH (ref 65–99)
Glucose-Capillary: 217 mg/dL — ABNORMAL HIGH (ref 65–99)

## 2017-04-02 LAB — CBC
HEMATOCRIT: 31 % — AB (ref 39.0–52.0)
HEMOGLOBIN: 10.3 g/dL — AB (ref 13.0–17.0)
MCH: 32.8 pg (ref 26.0–34.0)
MCHC: 33.2 g/dL (ref 30.0–36.0)
MCV: 98.7 fL (ref 78.0–100.0)
Platelets: 85 10*3/uL — ABNORMAL LOW (ref 150–400)
RBC: 3.14 MIL/uL — AB (ref 4.22–5.81)
RDW: 13.9 % (ref 11.5–15.5)
WBC: 16.9 10*3/uL — ABNORMAL HIGH (ref 4.0–10.5)

## 2017-04-02 LAB — COOXEMETRY PANEL
CARBOXYHEMOGLOBIN: 0.8 % (ref 0.5–1.5)
METHEMOGLOBIN: 1 % (ref 0.0–1.5)
O2 Saturation: 57.5 %
Total hemoglobin: 20.9 g/dL — ABNORMAL HIGH (ref 12.0–16.0)

## 2017-04-02 MED ORDER — FUROSEMIDE 10 MG/ML IJ SOLN
20.0000 mg | Freq: Once | INTRAMUSCULAR | Status: AC
  Administered 2017-04-02: 20 mg via INTRAVENOUS
  Filled 2017-04-02: qty 2

## 2017-04-02 MED ORDER — FUROSEMIDE 10 MG/ML IJ SOLN
20.0000 mg | Freq: Four times a day (QID) | INTRAMUSCULAR | Status: DC
  Administered 2017-04-02 (×2): 20 mg via INTRAVENOUS
  Filled 2017-04-02 (×2): qty 2

## 2017-04-02 MED ORDER — CLOPIDOGREL BISULFATE 75 MG PO TABS
75.0000 mg | ORAL_TABLET | Freq: Every day | ORAL | Status: DC
  Administered 2017-04-02 – 2017-04-09 (×8): 75 mg via ORAL
  Filled 2017-04-02 (×8): qty 1

## 2017-04-02 MED ORDER — ATORVASTATIN CALCIUM 80 MG PO TABS
80.0000 mg | ORAL_TABLET | Freq: Every day | ORAL | Status: DC
  Administered 2017-04-03 – 2017-04-08 (×6): 80 mg via ORAL
  Filled 2017-04-02 (×6): qty 1

## 2017-04-02 MED ORDER — INSULIN DETEMIR 100 UNIT/ML ~~LOC~~ SOLN
20.0000 [IU] | Freq: Two times a day (BID) | SUBCUTANEOUS | Status: DC
  Administered 2017-04-02 – 2017-04-03 (×4): 20 [IU] via SUBCUTANEOUS
  Filled 2017-04-02 (×6): qty 0.2

## 2017-04-02 NOTE — Progress Notes (Signed)
Patient ID: Ryan Durham, male   DOB: 02/16/1956, 62 y.o.   MRN: [id number] T[facility] Evening Rounds  Hemodynamically stable on milrinone 0.2 Dopamine and levophed off. Rhythm is sinus 90's  Urine output ok

## 2017-04-02 NOTE — Progress Notes (Addendum)
[facility] DAILY ICU PROGRESS NOTE                   [address]            [place] [id number]          [phone]   2 Days Post-Op Procedure(s) (LRB): CORONARY ARTERY BYPASS GRAFTING (CABG) time 5 using bilateral saphaneous vien, harvested endoscopicly and right internal mammary artery. (N/A) CLIPPING OF ATRIAL APPENDAGE (Left) TRANSESOPHAGEAL ECHOCARDIOGRAM (TEE) (N/A)  Total Length of Stay:  LOS: 3 days   Subjective:  Doing okay, Denies specific complaints   Objective: Vital signs in last 24 hours: Temp:  [98.1 F (36.7 C)-100.6 F (38.1 C)] 98.1 F (36.7 C) (01/10 0722) Pulse Rate:  [84-156] 85 (01/10 0722) Cardiac Rhythm: Normal sinus rhythm (01/10 0400) Resp:  [8-26] 19 (01/10 0722) BP: (76-107)/(52-84) 99/73 (01/10 0722) SpO2:  [89 %-100 %] 91 % (01/10 0722) Arterial Line BP: (70-191)/(49-170) 147/141 (01/10 0430) FiO2 (%):  [40 %-50 %] 40 % (01/09 0905) Weight:  [225 lb 1.4 oz (102.1 kg)] 225 lb 1.4 oz (102.1 kg) (01/10 0500)  Filed Weights   03/31/17 0600 04/01/17 0500 04/02/17 0500  Weight: 210 lb 15.7 oz (95.7 kg) 228 lb 6.3 oz (103.6 kg) 225 lb 1.4 oz (102.1 kg)    Weight change: -4.9 oz (-1.5 kg)   Hemodynamic parameters for last 24 hours: PAP: (26-36)/(11-22) 34/16 CO:  [4.7 L/min-5.9 L/min] 5.6 L/min CI:  [2.2 L/min/m2-2.7 L/min/m2] 2.6 L/min/m2  Intake/Output from previous day: 01/09 0701 - 01/10 0700 In: 2039.7 [P.O.:600; I.V.:1239.7; NG/GT:100; IV Piggyback:100] Out: 1500 [Urine:900; Chest Tube:600]  Intake/Output this shift: No intake/output data recorded.  Current Meds: Scheduled Meds: . acetaminophen  1,000 mg Oral Q6H  . aspirin EC  81 mg Oral Daily  . [START ON 04/03/2017] atorvastatin  80 mg Oral q1800  . bisacodyl  10 mg Oral Daily   Or  . bisacodyl  10 mg Rectal Daily  . Chlorhexidine Gluconate Cloth  6 each Topical Daily  . clopidogrel  75 mg Oral Daily  . docusate sodium  200 mg Oral Daily  . enoxaparin  (LOVENOX) injection  40 mg Subcutaneous QHS  . insulin aspart  0-24 Units Subcutaneous Q4H  . insulin detemir  20 Units Subcutaneous Daily  . mouth rinse  15 mL Mouth Rinse BID  . pantoprazole  40 mg Oral Daily  . sodium chloride flush  10-40 mL Intracatheter Q12H  . sodium chloride flush  3 mL Intravenous Q12H   Continuous Infusions: . sodium chloride    . cefUROXime (ZINACEF)  IV Stopped (04/01/17 2213)  . DOPamine 3 mcg/kg/min (04/02/17 0117)  . lactated ringers Stopped (03/31/17 2000)  . lactated ringers 20 mL/hr at 04/01/17 0700  . milrinone 0.3 mcg/kg/min (04/02/17 0428)  . norepinephrine (LEVOPHED) Adult infusion 2 mcg/min (04/01/17 1836)   PRN Meds:.levalbuterol, metoprolol tartrate, morphine injection, ondansetron (ZOFRAN) IV, oxyCODONE, sodium chloride flush, sodium chloride flush, traMADol  General appearance: alert, cooperative and no distress Heart: regular rate and rhythm Lungs: clear to auscultation bilaterally Abdomen: soft, non-tender; bowel sounds normal; no masses,  no organomegaly Extremities: edema trace Wound: clean and dry, aqaucel remains in place on chest  Lab Results: CBC: Recent Labs    04/01/17 1617 04/01/17 1624 04/02/17 0407  WBC 17.4*  --  16.9*  HGB 10.7* 9.9* 10.3*  HCT 31.4* 29.0* 31.0*  PLT 98*  --  85*   BMET:  Recent Labs  04/01/17 0401  04/01/17 1624 04/02/17 0407  NA 137  --  140 135  K 4.0  --  3.8 3.9  CL 107  --  105 103  CO2 23  --   --  25  GLUCOSE 108*  --  156* 202*  BUN 21*  --  23* 26*  CREATININE 0.91   < > 0.70 1.09  CALCIUM 7.8*  --   --  7.9*   < > = values in this interval not displayed.    CMET: Lab Results  Component Value Date   WBC 16.9 (H) 04/02/2017   HGB 10.3 (L) 04/02/2017   HCT 31.0 (L) 04/02/2017   PLT 85 (L) 04/02/2017   GLUCOSE 202 (H) 04/02/2017   CHOL 277 (H) 03/29/2017   TRIG 299 (H) 03/29/2017   HDL 37 (L) 03/29/2017   LDLCALC 180 (H) 03/29/2017   ALT 173 (H) 04/02/2017   AST 201  (H) 04/02/2017   NA 135 04/02/2017   K 3.9 04/02/2017   CL 103 04/02/2017   CREATININE 1.09 04/02/2017   BUN 26 (H) 04/02/2017   CO2 25 04/02/2017   TSH 4.454 03/29/2017   INR 1.31 03/31/2017   HGBA1C 6.2 (H) 03/30/2017      PT/INR:  Recent Labs    03/31/17 1457  LABPROT 16.1*  INR 1.31   Radiology: No results found.   Assessment/Plan: S/P Procedure(s) (LRB): CORONARY ARTERY BYPASS GRAFTING (CABG) time 5 using bilateral saphaneous vien, harvested endoscopicly and right internal mammary artery. (N/A) CLIPPING OF ATRIAL APPENDAGE (Left) TRANSESOPHAGEAL ECHOCARDIOGRAM (TEE) (N/A)  1. CV- NSR, IABP out- wean Milrinone, Dopamine, Levophed as hemodynamics allow... Plavix, ASA for ACS 2. Pulm- wean oxygen as tolerated, CXR free from pneumothorax, + atelectasis, CT output 80 cc since midnight.. Will d/c today 3. Renal- creatinine stable at 1.09, K at 3.9, weight down a few pounds after IV lasix yesterday, will continue gentle diuresis today 4. Thrombocytopenia- Plts down to 85 from 98, will stop Lovenox and repeat level in AM 5. CBGs- mostly controlled, monitor today, if necessary will increase levemir 6. Dispo- patient stable, d/c chest tubes, swan, gentle diuresis, start Plavix for ACS, repeat labs, CXR in AM     Erin Barrett 04/02/2017 7:32 AM   I have seen and examined the patient and agree with the assessment and plan as outlined.  Doing well POD2 s/p CABG x5 with preop acute STEMI complicated by acute hypoxemic respiratory failure, acute on chronic systolic CHF with cardiogenic shock and paroxysmal atrial fibrillation.  Mobilize.  D/C tubes.  D/C Swan-Ganz.  Wean milrinone slowly.  DAPT.  Statin.  Diuresis.  Hold beta blockers and ACE-I for now and wean levophed as tolerated.  Continue low dose dopamine until stable off levophed.  Watch platelet count and LFT's.  Increase levemir and continue SSI.  Will need education for newly diagnosed type II DM prior to D/C in addition  to that for CAD, CHF, and hyperlipidemia.  Purcell Nails, MD 04/02/2017 8:25 AM

## 2017-04-03 ENCOUNTER — Inpatient Hospital Stay (HOSPITAL_COMMUNITY): Payer: 59

## 2017-04-03 LAB — CBC
HEMATOCRIT: 30.2 % — AB (ref 39.0–52.0)
HEMOGLOBIN: 9.9 g/dL — AB (ref 13.0–17.0)
MCH: 32.4 pg (ref 26.0–34.0)
MCHC: 32.8 g/dL (ref 30.0–36.0)
MCV: 98.7 fL (ref 78.0–100.0)
Platelets: 90 10*3/uL — ABNORMAL LOW (ref 150–400)
RBC: 3.06 MIL/uL — ABNORMAL LOW (ref 4.22–5.81)
RDW: 13.8 % (ref 11.5–15.5)
WBC: 12 10*3/uL — ABNORMAL HIGH (ref 4.0–10.5)

## 2017-04-03 LAB — COMPREHENSIVE METABOLIC PANEL
ALBUMIN: 2.8 g/dL — AB (ref 3.5–5.0)
ALK PHOS: 67 U/L (ref 38–126)
ALT: 137 U/L — ABNORMAL HIGH (ref 17–63)
ANION GAP: 8 (ref 5–15)
AST: 116 U/L — ABNORMAL HIGH (ref 15–41)
BILIRUBIN TOTAL: 1.1 mg/dL (ref 0.3–1.2)
BUN: 26 mg/dL — ABNORMAL HIGH (ref 6–20)
CALCIUM: 7.9 mg/dL — AB (ref 8.9–10.3)
CO2: 27 mmol/L (ref 22–32)
Chloride: 99 mmol/L — ABNORMAL LOW (ref 101–111)
Creatinine, Ser: 0.8 mg/dL (ref 0.61–1.24)
GFR calc Af Amer: >60 mL/min (ref >60)
GFR calc non Af Amer: >60 mL/min (ref >60)
GLUCOSE: 140 mg/dL — AB (ref 65–99)
Potassium: 3.4 mmol/L — ABNORMAL LOW (ref 3.5–5.1)
Sodium: 134 mmol/L — ABNORMAL LOW (ref 135–145)
TOTAL PROTEIN: 5.6 g/dL — AB (ref 6.5–8.1)

## 2017-04-03 LAB — GLUCOSE, CAPILLARY
GLUCOSE-CAPILLARY: 129 mg/dL — AB (ref 65–99)
GLUCOSE-CAPILLARY: 135 mg/dL — AB (ref 65–99)
Glucose-Capillary: 143 mg/dL — ABNORMAL HIGH (ref 65–99)
Glucose-Capillary: 152 mg/dL — ABNORMAL HIGH (ref 65–99)
Glucose-Capillary: 158 mg/dL — ABNORMAL HIGH (ref 65–99)
Glucose-Capillary: 172 mg/dL — ABNORMAL HIGH (ref 65–99)

## 2017-04-03 LAB — COOXEMETRY PANEL
Carboxyhemoglobin: 1.4 % (ref 0.5–1.5)
METHEMOGLOBIN: 0.8 % (ref 0.0–1.5)
O2 Saturation: 65.6 %
Total hemoglobin: 13.5 g/dL (ref 12.0–16.0)

## 2017-04-03 MED ORDER — SODIUM CHLORIDE 0.9% FLUSH
10.0000 mL | Freq: Two times a day (BID) | INTRAVENOUS | Status: DC
  Administered 2017-04-04 – 2017-04-07 (×6): 10 mL

## 2017-04-03 MED ORDER — LISINOPRIL 2.5 MG PO TABS
2.5000 mg | ORAL_TABLET | Freq: Every day | ORAL | Status: DC
  Administered 2017-04-03 – 2017-04-05 (×3): 2.5 mg via ORAL
  Filled 2017-04-03 (×3): qty 1

## 2017-04-03 MED ORDER — CARVEDILOL 3.125 MG PO TABS
3.1250 mg | ORAL_TABLET | Freq: Two times a day (BID) | ORAL | Status: DC
  Administered 2017-04-04 – 2017-04-09 (×11): 3.125 mg via ORAL
  Filled 2017-04-03 (×11): qty 1

## 2017-04-03 MED ORDER — POTASSIUM CHLORIDE 10 MEQ/50ML IV SOLN
10.0000 meq | INTRAVENOUS | Status: AC
  Administered 2017-04-03 (×3): 10 meq via INTRAVENOUS
  Filled 2017-04-03 (×3): qty 50

## 2017-04-03 MED ORDER — FUROSEMIDE 10 MG/ML IJ SOLN
40.0000 mg | Freq: Two times a day (BID) | INTRAMUSCULAR | Status: DC
  Administered 2017-04-03 – 2017-04-05 (×5): 40 mg via INTRAVENOUS
  Filled 2017-04-03 (×5): qty 4

## 2017-04-03 MED ORDER — POTASSIUM CHLORIDE CRYS ER 20 MEQ PO TBCR
20.0000 meq | EXTENDED_RELEASE_TABLET | Freq: Two times a day (BID) | ORAL | Status: DC
  Administered 2017-04-03 – 2017-04-09 (×13): 20 meq via ORAL
  Filled 2017-04-03 (×13): qty 1

## 2017-04-03 MED ORDER — CHLORHEXIDINE GLUCONATE CLOTH 2 % EX PADS: 6.0000 | MEDICATED_PAD | Freq: Every day | CUTANEOUS | Status: DC

## 2017-04-03 MED ORDER — INSULIN ASPART 100 UNIT/ML ~~LOC~~ SOLN
0.0000 [IU] | Freq: Three times a day (TID) | SUBCUTANEOUS | Status: DC
  Administered 2017-04-03 (×2): 2 [IU] via SUBCUTANEOUS
  Administered 2017-04-03: 4 [IU] via SUBCUTANEOUS

## 2017-04-03 MED ORDER — LIVING WELL WITH DIABETES BOOK
Freq: Once | Status: AC
  Administered 2017-04-03: 14:00:00
  Filled 2017-04-03: qty 1

## 2017-04-03 MED ORDER — SODIUM CHLORIDE 0.9% FLUSH
10.0000 mL | INTRAVENOUS | Status: DC | PRN
  Administered 2017-04-07: 20 mL
  Filled 2017-04-03: qty 40

## 2017-04-03 MED ORDER — MOVING RIGHT ALONG BOOK
Freq: Once | Status: DC
  Filled 2017-04-03: qty 1

## 2017-04-03 MED ORDER — POTASSIUM CHLORIDE 10 MEQ/50ML IV SOLN
10.0000 meq | INTRAVENOUS | Status: AC
  Administered 2017-04-03 (×3): 10 meq via INTRAVENOUS

## 2017-04-03 NOTE — Progress Notes (Signed)
Patient ambulated 63100ft in the hall using a front wheel walker on 3LO2, ambulation well tolerated, will continue to monitor.

## 2017-04-03 NOTE — Progress Notes (Addendum)
      [address]       [place] [id number]             [phone]        CARDIOTHORACIC SURGERY PROGRESS NOTE   R3 Days Post-Op Procedure(s) (LRB): CORONARY ARTERY BYPASS GRAFTING (CABG) time 5 using bilateral saphaneous vien, harvested endoscopicly and right internal mammary artery. (N/A) CLIPPING OF ATRIAL APPENDAGE (Left) TRANSESOPHAGEAL ECHOCARDIOGRAM (TEE) (N/A)  Subjective:  Doing okay. No new complaints.  Back in bed.  Objective: Vital signs: BP Readings from Last 1 Encounters:  04/03/17 128/62   Pulse Readings from Last 1 Encounters:  04/03/17 87   Resp Readings from Last 1 Encounters:  04/03/17 17   Temp Readings from Last 1 Encounters:  04/03/17 98.5 F (36.9 C) (Oral)    Hemodynamics: PAP: (38)/(22) 38/22  Physical Exam:  Rhythm:   NSR  Breath sounds: CTA bilaterally  Heart sounds:  RRR  Incisions: Clean and dry  Abdomen:  Soft non-tender, non-distended  Extremities:  Trace edema   Intake/Output from previous day: 01/10 0701 - 01/11 0700 In: 1322.6 [P.O.:460; I.V.:712.6; IV Piggyback:150] Out: 1910 [Urine:1750; Chest Tube:160] Intake/Output this shift: Total I/O In: 302.9 [P.O.:240; I.V.:12.9; IV Piggyback:50] Out: -   Lab Results:  CBC: Recent Labs    04/02/17 0407 04/03/17 0402  WBC 16.9* 12.0*  HGB 10.3* 9.9*  HCT 31.0* 30.2*  PLT 85* 90*    BMET:  Recent Labs    04/02/17 0407 04/03/17 0402  NA 135 134*  K 3.9 3.4*  CL 103 99*  CO2 25 27  GLUCOSE 202* 140*  BUN 26* 26*  CREATININE 1.09 0.80  CALCIUM 7.9* 7.9*     PT/INR:   Recent Labs    03/31/17 1457  LABPROT 16.1*  INR 1.31    CBG (last 3)  Recent Labs    04/03/17 0017 04/03/17 0336 04/03/17 0741  GLUCAP 143* 129* 152*    ABG    Component Value Date/Time   PHART 7.402 04/01/2017 1051   PCO2ART 37.9 04/01/2017 1051   PO2ART 83.0 04/01/2017 1051   HCO3 23.4 04/01/2017 1051   TCO2 21 (L) 04/01/2017 1624   ACIDBASEDEF 1.0  04/01/2017 1051   O2SAT 65.6 04/03/2017 0419    CXR: No pneumothorax, post chest tube removal, improvement of pulmonary edema  Assessment/Plan: S/P Procedure(s) (LRB): CORONARY ARTERY BYPASS GRAFTING (CABG) time 5 using bilateral saphaneous vien, harvested endoscopicly and right internal mammary artery. (N/A) CLIPPING OF ATRIAL APPENDAGE (Left) TRANSESOPHAGEAL ECHOCARDIOGRAM (TEE) (N/A)   1. CV- NSR, remains on Milrinone at 0.125, Coox was 65.6 this morning, continue Lopressor, lisinopril if BP allows, contiue DAPT 2. Pulm- no acute issues, good use of IS, wean oxygen as tolerated 3. Renal- creatinine remains stable, weight is trending down, continue Lasix, potassium 4. Thrombocytopenia, stable at 90 continue to hold ASA 5. Elevated LFTs, trending down.. Likely related to cardiogenic shock preoperatively 6. DM- new diagnosis, sugars are controlled continue current regimen, will order Diabetes education consult 7. Dispo- patient stable, maintaining NSR, wean Milrinone as tolerated, continue DAPT, LFTs trending down, will get Diabetes education consult, continue diueretics  Denny Peonrin Barrett [name] 04/03/2017 8:56 AM  I have seen and examined the patient and agree with the assessment and plan as outlined.  Wean milrinone. Start low dose beta blocker and ACE-I.  Continue diuresis.  Insert PICC and d/c Cordis sleeve.  Transfer stepdown  Purcell Nailslarence H Shital Crayton, MD 04/03/2017

## 2017-04-03 NOTE — Progress Notes (Addendum)
Received Diabetes Coordinator consult.  Spoke with patient about his HgbA1C result of 6.2%.  According to the American Diabetes Association, A1C of 6.5% is a diagnosis of diabetes. Patient states that his mother and sister have diabetes. Has never been told that he has prediabetes. Patient informed that he needs to start watching his diet, getting exercise following his surgery. He will be followed in cardiac rehab and will learn about how to watch blood sugars and watch his diet and follow exercise regimen.  Ordered Living Well with Diabetes booklet and gave him brief information about working to control blood sugars and to see his doctor routinely for follow up. Exit notes have been marked to be given to patient at discharge.   Smith MinceKendra Jetson Pickrel RN BSN CDE Diabetes Coordinator Pager: [phone]  8am-5pm

## 2017-04-03 NOTE — Discharge Summary (Signed)
Physician Discharge Summary  Patient ID: Ryan Durham MRN: [id number] DOB/AGE: [birth year] 62 y.o.  Admit date: 03/29/2017 Discharge date: 04/09/2017  Admission Diagnoses:  Patient Active Problem List   Diagnosis Date Noted  . Type II diabetes mellitus (HCC) 03/30/2017  . Cardiomyopathy, ischemic 03/30/2017  . NSTEMI (non-ST elevated myocardial infarction) (HCC)   . Hyperlipidemia   . Tobacco abuse   . Coronary artery disease involving native coronary artery of native heart with unstable angina pectoris (HCC) 03/29/2017  . Paroxysmal atrial fibrillation (HCC) 03/29/2017  . Acute on chronic systolic heart failure (HCC) 03/29/2017   Discharge Diagnoses:   Patient Active Problem List   Diagnosis Date Noted  . S/P CABG x 5 03/31/2017  . Type II diabetes mellitus (HCC) 03/30/2017  . Cardiomyopathy, ischemic 03/30/2017  . NSTEMI (non-ST elevated myocardial infarction) (HCC)   . Hyperlipidemia   . Tobacco abuse   . Coronary artery disease involving native coronary artery of native heart with unstable angina pectoris (HCC) 03/29/2017  . Paroxysmal atrial fibrillation (HCC) 03/29/2017  . Acute on chronic systolic heart failure (HCC) 03/29/2017   Discharged Condition: good  History of Present Illness:  Mr. Ryan Durham is a 62 yo obese white male with no previous history of coronary artery disease but risk factors notable for long-standing tobacco abuse, hyperlipidemia, and a strong family history of coronary artery disease.  The patient states that approximately 3 years ago he had a prolonged episode of chest discomfort associated with shortness of breath for which he never went to see a physician.  The symptoms lasted for many hours and gradually subsided.  More recently he has remained in his usual state of health although he admits that he gets short of breath with more strenuous physical exertion.  Yesterday at work he developed sudden onset of severe chest tightness associated with  shortness of breath for which he presented to the emergency department.  On arrival he was noted in rapid atrial fibrillation with acute hypoxemic respiratory failure and cardiogenic shock.  He spontaneously converted to sinus rhythm with an EKG revealed diffuse ST changes suspicious for acute myocardial infarction.  He underwent diagnostic cardiac catheterization demonstrating critical three-vessel coronary artery disease with chronic occlusion of the proximal left anterior descending coronary artery and high-grade proximal stenosis both the left circumflex and the right coronary arteries.  Left ventricular end-diastolic pressure was elevated.  Intra-aortic balloon pump was placed in the patient's symptoms and cardiogenic shock resolved. He was admitted for further care and CABG consult was requested.  Hospital Course:   Patient denied chest pain during hospitalization.  Follow-up echocardiogram performed earlier today demonstrates severe left ventricular systolic dysfunction.  There was akinesis of the distal anterior wall and apex as well as akinesis of the mid apical lateral myocardium.  Ejection fraction was estimated 25-30%.  The aortic valve appeared normal.  There was no significant mitral regurgitation.  He was evaluated by Dr. Cornelius Moras who was in agreement the patient's best treatment option would be coronary bypass grafting.  The risks and benefits of the procedure were explained to the patient and he was agreeable to proceed.  He was taken to the operating room on 03/31/2058.  He underwent CABG x 5 utilizing LIMA to LAD, SVG to PDA, SVG to Left Circumflex, SVG to Intermediate.  He also underwent clipping of his LA Appendage and endoscopic harvest of greater saphenous vein from his right thigh and left leg.  He tolerated the procedure without difficulty and was taken to the  SICU in stable condition.  During his stay in the SICU the patient was weaned and extubated on POD #1.  He was weaned off Dopamine, Neo  synephrine, Levophed, and Milrinone as hemodynamics allowed.  He was weaned off his IABP on POD #1.  His chest tubes and arterial lines were removed on POD #2.  He was started on DAPT for ACS.  He was found to be thrombocytopenic at 85 and his Lovenox was discontinued.  His blood work revealed elevated LFTs likely due to preoperative cardiogenic shock.  He was maintaining NSR and was transferred to the step down unit on 04/03/2017.  He remained volume overloaded.  AHF team was consulted due to patient low EF.  They started with patient on aggressive IV diuresis with good response.  He has been transitioned to an oral regimen.  He has been tolerating a diet and has had a bowel movement. His pre op HGA1C was 6.2. He is pre diabetic. I instructed him to make a follow up appointment with his medical doctor for further surveillance. Also, he will be given diet recommendations in order to avoid developing diabetes. He had ABL anemia. His last H and H was stable at 9.6 and 29. He had mild thrombocytopenia. His last platelet count was up to 120.000. He was ambulating on room air. Epicardial pacing wires were removed on 04/05/2017. Chest tube sutures will be removed the day of discharge. He is felt surgically stable for discharge today.  Significant Diagnostic Studies: angiography:    Prox RCA lesion is 80% stenosed.  Prox RCA to Dist RCA lesion is 30% stenosed.  Ost RPDA lesion is 99% stenosed.  Post Atrio lesion is 80% stenosed.  Dist LM to Ost LAD lesion is 100% stenosed.  Ost 1st Mrg lesion is 100% stenosed.  Ost Cx to Prox Cx lesion is 99% stenosed.  Ost 2nd Mrg to 2nd Mrg lesion is 70% stenosed.   1. Severe triple vessel CAD  2.  The LAD is not visualized on injections of the left system. The proximal LAD appears to be chronically occluded. The distal LAD and a diagonal branch fills from right to left collaterals.  3. The Circumflex has a 99% proximal stenosis at the takeoff of a moderate caliber  OM branch which fills slowly. The second OM branch has a moderately severe stenosis.  4. The RCA is a large dominant vessel with high grade disease in the proximal vessel, PDA and PLA.  5. Elevated filling pressures.  6. Successful placement of an IABP  ECHO:  Study Conclusions  - Left ventricle: The cavity size was mildly dilated. Systolic   function was severely reduced. The estimated ejection fraction   was in the range of 25% to 30%. There is akinesis of the   apicalanterior and inferior myocardium. There is akinesis of the   mid-apicallateral myocardium. There is akinesis of the   entireinferolateral and apical myocardium. Features are   consistent with a pseudonormal left ventricular filling pattern,   with concomitant abnormal relaxation and increased filling   pressure (grade 2 diastolic dysfunction). Doppler parameters are   consistent with high ventricular filling pressure. - Aortic valve: Trileaflet; mildly thickened, mildly calcified   leaflets. Valve area (VTI): 3.17 cm^2. Valve area (Vmax): 2.96   cm^2. Valve area (Vmean): 2.99 cm^2. - Pericardium, extracardiac: A trivial, free-flowing pericardial   effusion was identified along the right ventricular free wall and   along the right atrial free wall. The fluid had no  internal   echoes.   Treatments: surgery:    Coronary Artery Bypass Grafting x 5              Left Internal Mammary Artery to Distal Left Anterior Descending Coronary Artery             Saphenous Vein Graft to Posterior Descending Coronary Artery             Sequential Saphenous Vein Graft to Posterolateral Branch of Distal Right Coronary Artery             Saphenous Vein Graft to Distal Left Circumflex Coronary Artery             Sapheonous Vein Graft to Intermediate Branch Coronary Artery             Endoscopic Vein Harvest from Bilateral Thighs and Left Lower Leg             Clipping of Left Atrial Appendage  Disposition: Home  Discharge  medications:  Discharge Instructions    Amb Referral to Cardiac Rehabilitation   Complete by:  As directed    Diagnosis:   NSTEMI CABG     CABG X ___:  5     Allergies as of 04/09/2017   No Known Allergies     Medication List    TAKE these medications   aspirin 81 MG EC tablet Take 1 tablet (81 mg total) by mouth daily. Start taking on:  04/10/2017   atorvastatin 80 MG tablet Commonly known as:  LIPITOR Take 1 tablet (80 mg total) by mouth daily at 6 PM.   carvedilol 3.125 MG tablet Commonly known as:  COREG Take 1 tablet (3.125 mg total) by mouth 2 (two) times daily with a meal.   clopidogrel 75 MG tablet Commonly known as:  PLAVIX Take 1 tablet (75 mg total) by mouth daily. Start taking on:  04/10/2017   furosemide 40 MG tablet Commonly known as:  LASIX Take 1 tablet (40 mg total) by mouth 2 (two) times daily.   losartan 25 MG tablet Commonly known as:  COZAAR Take 1 tablet (25 mg total) by mouth daily. Start taking on:  04/10/2017   nicotine 14 mg/24hr patch Commonly known as:  NICODERM CQ - dosed in mg/24 hours Place 1 patch (14 mg total) onto the skin daily. Start taking on:  04/10/2017   oxyCODONE 5 MG immediate release tablet Commonly known as:  Oxy IR/ROXICODONE Take 1 tablet (5 mg total) by mouth every 4 (four) hours as needed for severe pain.   potassium chloride SA 20 MEQ tablet Commonly known as:  K-DUR,KLOR-CON Take 1 tablet (20 mEq total) by mouth 2 (two) times daily.   spironolactone 25 MG tablet Commonly known as:  ALDACTONE Take 0.5 tablets (12.5 mg total) by mouth daily.     The patient has been discharged on:   1.Beta Blocker:  Yes [ x  ]                              No   [   ]                              If No, reason:  2.Ace Inhibitor/ARB: Yes [  x ]  No  [    ]                                     If No, reason:  3.Statin:   Yes [ x  ]                  No  [   ]                  If No,  reason:  4.Ecasa:  Yes  [ x  ]                  No   [   ]                  If No, reason:  Follow-up Information    [facility] Follow up on 05/04/2017.   Specialty:  Cardiothoracic Surgery Why:  Appoinment is at 1:00, please get CXR at 12:30 at [facility] located on first floor of our office building Contact information: [address] [place], [address] [place] [address] [phone]       Allayne Butcher, [place]. Go on 04/21/2017.   Specialties:  Cardiology, Radiology Why:  Appointment time is at 3:00 pm Contact information: [address] [place] [address] [phone]           Signed: Sharlene Dory [name] 04/09/2017, 9:13 AM

## 2017-04-03 NOTE — Progress Notes (Signed)
Patient arrived from [facility] on a wheelchair, assessment completed see flow sheet, placed on tele ccmd notified, patient oriented to room and staff, bed in lowest position, call bell within reach, will continue to monitor.

## 2017-04-03 NOTE — Progress Notes (Signed)
Peripherally Inserted Central Catheter/Midline Placement  The IV Nurse has discussed with the patient and/or persons authorized to consent for the patient, the purpose of this procedure and the potential benefits and risks involved with this procedure.  The benefits include less needle sticks, lab draws from the catheter, and the patient may be discharged home with the catheter. Risks include, but not limited to, infection, bleeding, blood clot (thrombus formation), and puncture of an artery; nerve damage and irregular heartbeat and possibility to perform a PICC exchange if needed/ordered by physician.  Alternatives to this procedure were also discussed.  Bard Power PICC patient education guide, fact sheet on infection prevention and patient information card has been provided to patient /or left at bedside.    PICC/Midline Placement Documentation  PICC Double Lumen 04/03/17 PICC Right Brachial 41 cm 0 cm (Active)  Indication for Insertion or Continuance of Line Vasoactive infusions 04/03/2017 11:00 AM  Exposed Catheter (cm) 0 cm 04/03/2017 11:00 AM  Site Assessment Clean;Dry;Intact 04/03/2017 11:00 AM  Lumen #1 Status Flushed;Blood return noted 04/03/2017 11:00 AM  Lumen #2 Status Flushed;Blood return noted 04/03/2017 11:00 AM  Dressing Type Transparent 04/03/2017 11:00 AM  Dressing Status Clean;Dry;Intact;Antimicrobial disc in place 04/03/2017 11:00 AM  Dressing Intervention New dressing 04/03/2017 11:00 AM  Dressing Change Due 04/10/17 04/03/2017 11:00 AM       Reginia FortsLumban, Edith Groleau Albarece 04/03/2017, 11:29 AM

## 2017-04-04 ENCOUNTER — Inpatient Hospital Stay (HOSPITAL_COMMUNITY): Payer: 59

## 2017-04-04 LAB — COOXEMETRY PANEL
Carboxyhemoglobin: 1.5 % (ref 0.5–1.5)
Methemoglobin: 0.9 % (ref 0.0–1.5)
O2 Saturation: 58.8 %
TOTAL HEMOGLOBIN: 9.8 g/dL — AB (ref 12.0–16.0)

## 2017-04-04 LAB — GLUCOSE, CAPILLARY
GLUCOSE-CAPILLARY: 131 mg/dL — AB (ref 65–99)
Glucose-Capillary: 118 mg/dL — ABNORMAL HIGH (ref 65–99)
Glucose-Capillary: 157 mg/dL — ABNORMAL HIGH (ref 65–99)
Glucose-Capillary: 188 mg/dL — ABNORMAL HIGH (ref 65–99)

## 2017-04-04 LAB — BASIC METABOLIC PANEL
Anion gap: 8 (ref 5–15)
BUN: 27 mg/dL — AB (ref 6–20)
CALCIUM: 7.7 mg/dL — AB (ref 8.9–10.3)
CO2: 26 mmol/L (ref 22–32)
CREATININE: 0.86 mg/dL (ref 0.61–1.24)
Chloride: 100 mmol/L — ABNORMAL LOW (ref 101–111)
GFR calc Af Amer: >60 mL/min (ref >60)
GLUCOSE: 122 mg/dL — AB (ref 65–99)
Potassium: 3.7 mmol/L (ref 3.5–5.1)
Sodium: 134 mmol/L — ABNORMAL LOW (ref 135–145)

## 2017-04-04 LAB — CBC
HEMATOCRIT: 29 % — AB (ref 39.0–52.0)
Hemoglobin: 9.6 g/dL — ABNORMAL LOW (ref 13.0–17.0)
MCH: 32.9 pg (ref 26.0–34.0)
MCHC: 33.1 g/dL (ref 30.0–36.0)
MCV: 99.3 fL (ref 78.0–100.0)
Platelets: 120 10*3/uL — ABNORMAL LOW (ref 150–400)
RBC: 2.92 MIL/uL — ABNORMAL LOW (ref 4.22–5.81)
RDW: 14.1 % (ref 11.5–15.5)
WBC: 10 10*3/uL (ref 4.0–10.5)

## 2017-04-04 MED ORDER — SPIRONOLACTONE 12.5 MG HALF TABLET
12.5000 mg | ORAL_TABLET | Freq: Every day | ORAL | Status: DC
  Administered 2017-04-04 – 2017-04-09 (×6): 12.5 mg via ORAL
  Filled 2017-04-04 (×6): qty 1

## 2017-04-04 MED ORDER — POTASSIUM CHLORIDE CRYS ER 20 MEQ PO TBCR
30.0000 meq | EXTENDED_RELEASE_TABLET | Freq: Once | ORAL | Status: AC
  Administered 2017-04-04: 17:00:00 30 meq via ORAL
  Filled 2017-04-04 (×2): qty 1

## 2017-04-04 NOTE — Progress Notes (Addendum)
      [address]       [place] [id number]             [phone]        4 Days Post-Op Procedure(s) (LRB): CORONARY ARTERY BYPASS GRAFTING (CABG) time 5 using bilateral saphaneous vien, harvested endoscopicly and right internal mammary artery. (N/A) CLIPPING OF ATRIAL APPENDAGE (Left) TRANSESOPHAGEAL ECHOCARDIOGRAM (TEE) (N/A)  Subjective: Patient just finished breakfast. He has had a bowel movement. He has no other complaints this am.  Objective: Vital signs in last 24 hours: Temp:  [98.1 F (36.7 C)-99.2 F (37.3 C)] 98.5 F (36.9 C) (01/12 0445) Pulse Rate:  [83-100] 83 (01/12 0445) Cardiac Rhythm: Normal sinus rhythm (01/11 1900) Resp:  [17-33] 17 (01/12 0730) BP: (92-128)/(53-70) 111/69 (01/12 0445) SpO2:  [91 %-98 %] 92 % (01/12 0445) Weight:  [227 lb 14.4 oz (103.4 kg)] 227 lb 14.4 oz (103.4 kg) (01/12 0445)  Pre op weight 95.7 kg Current Weight  04/04/17 227 lb 14.4 oz (103.4 kg)      Intake/Output from previous day: 01/11 0701 - 01/12 0700 In: 979.7 [P.O.:460; I.V.:319.7; IV Piggyback:200] Out: 1375 [Urine:1375]   Physical Exam:  Cardiovascular: RRR Pulmonary: Slightly diminished at bases Abdomen: Soft, protuberant,non tender, bowel sounds present. Extremities: Bilateral lower extremity edema. Wounds: Clean and dry.  No erythema or signs of infection.  Lab Results: CBC: Recent Labs    04/03/17 0402 04/04/17 0331  WBC 12.0* 10.0  HGB 9.9* 9.6*  HCT 30.2* 29.0*  PLT 90* 120*   BMET:  Recent Labs    04/03/17 0402 04/04/17 0331  NA 134* 134*  K 3.4* 3.7  CL 99* 100*  CO2 27 26  GLUCOSE 140* 122*  BUN 26* 27*  CREATININE 0.80 0.86  CALCIUM 7.9* 7.7*    PT/INR:  Lab Results  Component Value Date   INR 1.31 03/31/2017   INR 1.12 03/30/2017   INR 1.03 03/29/2017   ABG:  INR: Will add last result for INR, ABG once components are confirmed Will add last 4 CBG results once components are  confirmed  Assessment/Plan:  1. CV - S/p cardiogenic shock,STEMI. SR in the 80's this am. On Milrinone drip. Co ox this am decreased this am to 58.8.On Coreg 3.125 mg bid, Lisinopril 2.5 mg daily, and DAPT (ecasa 81 mg and Plavix 75 mg daily). As discussed with Dr. Cornelius Moraswen, stop Milrinone drip. 2.  Pulmonary - On 2 liters of oxygen via [place]. Wean to room air as tolerates. CXR this am appears to show no pneumothorax, small pleural effusions, and bibasilar atelectasis L>R. Encourage incentive spirometer. 3. Volume Overload - On Lasix 40 mg IV bid 4.  Acute blood loss anemia - H and H stable at 9.6 and 29. 5. Thrombocytopenia-platelets increased to 120,000 6. Supplement potassium 7. CBGs 172/135/118. Pre op HGA1C 6.2. He is likely pre diabetic. Stop accu checks, scheduled Insulin. Will give information about diet recommendations at discharge and he will need further surveillance of HGA1C with his medical doctor after discharge. 8. Will remove EPW in am  Donielle M ZimmermanPA-C 04/04/2017,7:47 AM   I have seen and examined the patient and agree with the assessment and plan as outlined.  Stop milrinone.  Continue low dose carvedilol and lisinopril.  Continue diuresis.  Mobilize.  Purcell Nailslarence H Havilah Topor, MD 04/04/2017 1:20 PM

## 2017-04-04 NOTE — Discharge Instructions (Signed)
Preventing Type 2 Diabetes Mellitus Type 2 diabetes (type 2 diabetes mellitus) is a long-term (chronic) disease that affects blood sugar (glucose) levels. Normally, a hormone called insulin allows glucose to enter cells in the body. The cells use glucose for energy. In type 2 diabetes, one or both of these problems may be present:  The body does not make enough insulin.  The body does not respond properly to insulin that it makes (insulin resistance).  Insulin resistance or lack of insulin causes excess glucose to build up in the blood instead of going into cells. As a result, high blood glucose (hyperglycemia) develops, which can cause many complications. Being overweight or obese and having an inactive (sedentary) lifestyle can increase your risk for diabetes. Type 2 diabetes can be delayed or prevented by making certain nutrition and lifestyle changes. What nutrition changes can be made?  Eat healthy meals and snacks regularly. Keep a healthy snack with you for when you get hungry between meals, such as fruit or a handful of nuts.  Eat lean meats and proteins that are low in saturated fats, such as chicken, fish, egg whites, and beans. Avoid processed meats.  Eat plenty of fruits and vegetables and plenty of grains that have not been processed (whole grains). It is recommended that you eat: ? 1?2 cups of fruit every day. ? 2?3 cups of vegetables every day. ? 6?8 oz of whole grains every day, such as oats, whole wheat, bulgur, brown rice, quinoa, and millet.  Eat low-fat dairy products, such as milk, yogurt, and cheese.  Eat foods that contain healthy fats, such as nuts, avocado, olive oil, and canola oil.  Drink water throughout the day. Avoid drinks that contain added sugar, such as soda or sweet tea.  Follow instructions from your health care provider about specific eating or drinking restrictions.  Control how much food you eat at a time (portion size). ? Check food labels  to find out the serving sizes of foods. ? Use a kitchen scale to weigh amounts of foods.  Saute or steam food instead of frying it. Cook with water or broth instead of oils or butter.  Limit your intake of: ? Salt (sodium). Have no more than 1 tsp (2,400 mg) of sodium a day. If you have heart disease or high blood pressure, have less than ? tsp (1,500 mg) of sodium a day. ? Saturated fat. This is fat that is solid at room temperature, such as butter or fat on meat. What lifestyle changes can be made?  Activity  Do moderate-intensity physical activity for at least 30 minutes on at least 5 days of the week, or as much as told by your health care provider.  Ask your health care provider what activities are safe for you. A mix of physical activities may be best, such as walking, swimming, cycling, and strength training.  Try to add physical activity into your day. For example: ? Park in spots that are farther away than usual, so that you walk more. For example, park in a far corner of the parking lot when you go to the office or the grocery store. ? Take a walk during your lunch break. ? Use stairs instead of elevators or escalators. Weight Loss  Lose weight as directed. Your health care provider can determine how much weight loss is best for you and can help you lose weight safely.  If you are overweight or obese, you may be instructed to lose at least  5?7 % of your body weight. Alcohol and Tobacco   Limit alcohol intake to no more than 1 drink a day for nonpregnant women and 2 drinks a day for men. One drink equals 12 oz of beer, 5 oz of wine, or 1 oz of hard liquor.  Do not use any tobacco products, such as cigarettes, chewing tobacco, and e-cigarettes. If you need help quitting, ask your health care provider. Work With Your Health Care Provider  Have your blood glucose tested regularly, as told by your health care provider.  Discuss your risk factors and how you can reduce your  risk for diabetes.  Get screening tests as told by your health care provider. You may have screening tests regularly, especially if you have certain risk factors for type 2 diabetes.  Make an appointment with a diet and nutrition specialist (registered dietitian). A registered dietitian can help you make a healthy eating plan and can help you understand portion sizes and food labels. Why are these changes important?  It is possible to prevent or delay type 2 diabetes and related health problems by making lifestyle and nutrition changes.  It can be difficult to recognize signs of type 2 diabetes. The best way to avoid possible damage to your body is to take actions to prevent the disease before you develop symptoms. What can happen if changes are not made?  Your blood glucose levels may keep increasing. Having high blood glucose for a long time is dangerous. Too much glucose in your blood can damage your blood vessels, heart, kidneys, nerves, and eyes.  You may develop prediabetes or type 2 diabetes. Type 2 diabetes can lead to many chronic health problems and complications, such as: ? Heart disease. ? Stroke. ? Blindness. ? Kidney disease. ? Depression. ? Poor circulation in the feet and legs, which could lead to surgical removal (amputation) in severe cases. Where to find support:  Ask your health care provider to recommend a registered dietitian, diabetes educator, or weight loss program.  Look for local or online weight loss groups.  Join a gym, fitness club, or outdoor activity group, such as a walking club. Where to find more information: To learn more about diabetes and diabetes prevention, visit:  American Diabetes Association (ADA): www.diabetes.[employer] of Diabetes and Digestive and Kidney Diseases: ToyArticles.ca  To learn more about healthy eating, visit:  The U.S. Department of Agriculture Architect), Choose My Plate:  http://yates.biz/  Office of Disease Prevention and Health Promotion (ODPHP), Dietary Guidelines: ListingMagazine.si  Summary  You can reduce your risk for type 2 diabetes by increasing your physical activity, eating healthy foods, and losing weight as directed.  Talk with your health care provider about your risk for type 2 diabetes. Ask about any blood tests or screening tests that you need to have. This information is not intended to replace advice given to you by your health care provider. Make sure you discuss any questions you have with your health care provider. Document Released: 07/02/2015 Document Revised: 08/16/2015 Document Reviewed: 05/01/2015 Elsevier Interactive Patient Education  2018 Elsevier Inc. Coronary Artery Bypass Grafting, Care After This sheet gives you information about how to care for yourself after your procedure. Your health care provider may also give you more specific instructions. If you have problems or questions, contact your health care provider. What can I expect after the procedure? After the procedure, it is common to have:  Nausea and a lack of appetite.  Constipation.  Weakness and fatigue.  Depression or irritability. °· Pain or discomfort in your incision areas. ° °Follow these instructions at home: °Medicines °· Take over-the-counter and prescription medicines only as told by your health care provider. Do not stop taking medicines or start any new medicines without approval from your health care provider. °· If you were prescribed an antibiotic medicine, take it as told by your health care provider. Do not stop taking the antibiotic even if you start to feel better. °· Do not drive or use heavy machinery while taking prescription pain medicine. °Incision care °· Follow instructions from your health care provider about how to take care of your incisions. Make sure you: °? Wash your hands with soap and water before you  change your bandage (dressing). If soap and water are not available, use hand sanitizer. °? Change your dressing as told by your health care provider. °? Leave stitches (sutures), skin glue, or adhesive strips in place. These skin closures may need to stay in place for 2 weeks or longer. If adhesive strip edges start to loosen and curl up, you may trim the loose edges. Do not remove adhesive strips completely unless your health care provider tells you to do that. °· Keep incision areas clean, dry, and protected. °· Check your incision areas every day for signs of infection. Check for: °? More redness, swelling, or pain. °? More fluid or blood. °? Warmth. °? Pus or a bad smell. °· If incisions were made in your legs: °? Avoid crossing your legs. °? Avoid sitting for long periods of time. Change positions every 30 minutes. °? Raise (elevate) your legs when you are sitting. °Bathing °· Do not take baths, swim, or use a hot tub until your health care provider approves. °· Only take sponge baths. Pat the incisions dry. Do not rub incisions with a washcloth or towel. °· Ask your health care provider when you can shower. °Eating and drinking °· Eat foods that are high in fiber, such as raw fruits and vegetables, whole grains, beans, and nuts. Meats should be lean cut. Avoid canned, processed, and fried foods. This can help prevent constipation and is a recommended part of a heart-healthy diet. °· Drink enough fluid to keep your urine clear or pale yellow. °· Limit alcohol intake to no more than 1 drink a day for nonpregnant women and 2 drinks a day for men. One drink equals 12 oz of beer, 5 oz of wine, or 1½ oz of hard liquor. °Activity °· Rest and limit your activity as told by your health care provider. You may be instructed to: °? Stop any activity right away if you have chest pain, shortness of breath, irregular heartbeats, or dizziness. Get help right away if you have any of these symptoms. °? Move around frequently  for short periods or take short walks as directed by your health care provider. Gradually increase your activities. You may need physical therapy or cardiac rehabilitation to help strengthen your muscles and build your endurance. °? Avoid lifting, pushing, or pulling anything that is heavier than 10 lb (4.5 kg) for at least 6 weeks or as told by your health care provider. °· Do not drive until your health care provider approves. °· Ask your health care provider when you may return to work. °· Ask your health care provider when you may resume sexual activity. °General instructions °· Do not use any products that contain nicotine or tobacco, such as cigarettes and e-cigarettes. If you need help quitting, ask your   health care provider.  Take 2-3 deep breaths every few hours during the day, while you recover. This helps expand your lungs and prevent complications like pneumonia after surgery.  If you were given a device called an incentive spirometer, use it several times a day to practice deep breathing. Support your chest with a pillow or your arms when you take deep breaths or cough.  Wear compression stockings as told by your health care provider. These stockings help to prevent blood clots and reduce swelling in your legs.  Weigh yourself every day. This helps identify if your body is holding (retaining) fluid that may make your heart and lungs work harder.  Keep all follow-up visits as told by your health care provider. This is important. Contact a health care provider if:  You have more redness, swelling, or pain around any incision.  You have more fluid or blood coming from any incision.  Any incision feels warm to the touch.  You have pus or a bad smell coming from any incision  You have a fever.  You have swelling in your ankles or legs.  You have pain in your legs.  You gain 2 lb (0.9 kg) or more a day.  You are nauseous or you vomit.  You have diarrhea. Get help right away  if:  You have chest pain that spreads to your jaw or arms.  You are short of breath.  You have a fast or irregular heartbeat.  You notice a "clicking" in your breastbone (sternum) when you move.  You have numbness or weakness in your arms or legs.  You feel dizzy or light-headed. Summary  After the procedure, it is common to have pain or discomfort in the incision areas.  Do not take baths, swim, or use a hot tub until your health care provider approves.  Gradually increase your activities. You may need physical therapy or cardiac rehabilitation to help strengthen your muscles and build your endurance.  Weigh yourself every day. This helps identify if your body is holding (retaining) fluid that may make your heart and lungs work harder. This information is not intended to replace advice given to you by your health care provider. Make sure you discuss any questions you have with your health care provider. Document Released: 09/27/2004 Document Revised: 01/28/2016 Document Reviewed: 01/28/2016 Elsevier Interactive Patient Education  2018 Elsevier Inc.   Endoscopic Saphenous Vein Harvesting, Care After Refer to this sheet in the next few weeks. These instructions provide you with information about caring for yourself after your procedure. Your health care provider may also give you more specific instructions. Your treatment has been planned according to current medical practices, but problems sometimes occur. Call your health care provider if you have any problems or questions after your procedure. What can I expect after the procedure? After the procedure, it is common to have:  Pain.  Bruising.  Swelling.  Numbness.  Follow these instructions at home: Medicine  Take over-the-counter and prescription medicines only as told by your health care provider.  Do not drive or operate heavy machinery while taking prescription pain medicine. Incision care   Follow instructions  from your health care provider about how to take care of the cut made during surgery (incision). Make sure you: ? Wash your hands with soap and water before you change your bandage (dressing). If soap and water are not available, use hand sanitizer. ? Change your dressing as told by your health care provider. ? Leave stitches (sutures),  skin glue, or adhesive strips in place. These skin closures may need to be in place for 2 weeks or longer. If adhesive strip edges start to loosen and curl up, you may trim the loose edges. Do not remove adhesive strips completely unless your health care provider tells you to do that.  Check your incision area every day for signs of infection. Check for: ? More redness, swelling, or pain. ? More fluid or blood. ? Warmth. ? Pus or a bad smell. General instructions  Raise (elevate) your legs above the level of your heart while you are sitting or lying down.  Do any exercises your health care providers have given you. These may include deep breathing, coughing, and walking exercises.  Do not shower, take baths, swim, or use a hot tub unless told by your health care provider.  Wear your elastic stocking if told by your health care provider.  Keep all follow-up visits as told by your health care provider. This is important. Contact a health care provider if:  Medicine does not help your pain.  Your pain gets worse.  You have new leg bruises or your leg bruises get bigger.  You have a fever.  Your leg feels numb.  You have more redness, swelling, or pain around your incision.  You have more fluid or blood coming from your incision.  Your incision feels warm to the touch.  You have pus or a bad smell coming from your incision. Get help right away if:  Your pain is severe.  You develop pain, tenderness, warmth, redness, or swelling in any part of your leg.  You have chest pain.  You have trouble breathing. This information is not intended to  replace advice given to you by your health care provider. Make sure you discuss any questions you have with your health care provider. Document Released: 11/20/2010 Document Revised: 08/16/2015 Document Reviewed: 01/22/2015 Elsevier Interactive Patient Education  2018 [employer].

## 2017-04-05 DIAGNOSIS — I5023 Acute on chronic systolic (congestive) heart failure: Secondary | ICD-10-CM

## 2017-04-05 LAB — BASIC METABOLIC PANEL
Anion gap: 11 (ref 5–15)
BUN: 31 mg/dL — AB (ref 6–20)
CALCIUM: 8.1 mg/dL — AB (ref 8.9–10.3)
CO2: 25 mmol/L (ref 22–32)
CREATININE: 0.93 mg/dL (ref 0.61–1.24)
Chloride: 99 mmol/L — ABNORMAL LOW (ref 101–111)
GFR calc non Af Amer: >60 mL/min (ref >60)
Glucose, Bld: 151 mg/dL — ABNORMAL HIGH (ref 65–99)
Potassium: 3.8 mmol/L (ref 3.5–5.1)
Sodium: 135 mmol/L (ref 135–145)

## 2017-04-05 LAB — COOXEMETRY PANEL
CARBOXYHEMOGLOBIN: 1.3 % (ref 0.5–1.5)
CARBOXYHEMOGLOBIN: 1.3 % (ref 0.5–1.5)
Methemoglobin: 1.2 % (ref 0.0–1.5)
Methemoglobin: 1.2 % (ref 0.0–1.5)
O2 SAT: 62.4 %
O2 Saturation: 45.7 %
Total hemoglobin: 10.1 g/dL — ABNORMAL LOW (ref 12.0–16.0)
Total hemoglobin: 10.7 g/dL — ABNORMAL LOW (ref 12.0–16.0)

## 2017-04-05 LAB — GLUCOSE, CAPILLARY
Glucose-Capillary: 139 mg/dL — ABNORMAL HIGH (ref 65–99)
Glucose-Capillary: 130 mg/dL — ABNORMAL HIGH (ref 65–99)
Glucose-Capillary: 151 mg/dL — ABNORMAL HIGH (ref 65–99)

## 2017-04-05 MED ORDER — DIPHENHYDRAMINE HCL 25 MG PO CAPS
25.0000 mg | ORAL_CAPSULE | Freq: Every evening | ORAL | Status: DC | PRN
  Administered 2017-04-06: 25 mg via ORAL
  Filled 2017-04-05: qty 1

## 2017-04-05 MED ORDER — LOSARTAN POTASSIUM 25 MG PO TABS
25.0000 mg | ORAL_TABLET | Freq: Every day | ORAL | Status: DC
  Administered 2017-04-06 – 2017-04-09 (×4): 25 mg via ORAL
  Filled 2017-04-05 (×4): qty 1

## 2017-04-05 MED ORDER — FUROSEMIDE 10 MG/ML IJ SOLN
40.0000 mg | Freq: Three times a day (TID) | INTRAMUSCULAR | Status: DC
  Administered 2017-04-05 – 2017-04-07 (×6): 40 mg via INTRAVENOUS
  Filled 2017-04-05 (×6): qty 4

## 2017-04-05 NOTE — Progress Notes (Addendum)
      [address]       [place] [id number]             [phone]        5 Days Post-Op Procedure(s) (LRB): CORONARY ARTERY BYPASS GRAFTING (CABG) time 5 using bilateral saphaneous vien, harvested endoscopicly and right internal mammary artery. (N/A) CLIPPING OF ATRIAL APPENDAGE (Left) TRANSESOPHAGEAL ECHOCARDIOGRAM (TEE) (N/A)  Subjective: Patient states he is not sleeping well. He had problems sleeping before surgery. He did wake up all sweaty at one point last night. Also, had a "tickle in his throat" and he had a coughing fit.  Objective: Vital signs in last 24 hours: Temp:  [98.1 F (36.7 C)-99.1 F (37.3 C)] 98.3 F (36.8 C) (01/13 0520) Pulse Rate:  [72-84] 83 (01/13 0520) Cardiac Rhythm: Normal sinus rhythm (01/12 1900) Resp:  [14-21] 20 (01/13 0520) BP: (93-117)/(60-81) 117/71 (01/13 0520) SpO2:  [91 %-97 %] 97 % (01/13 0520) Weight:  [230 lb 1.6 oz (104.4 kg)] 230 lb 1.6 oz (104.4 kg) (01/13 0520)  Pre op weight 95.7 kg Current Weight  04/05/17 230 lb 1.6 oz (104.4 kg)      Intake/Output from previous day: 01/12 0701 - 01/13 0700 In: 756.6 [P.O.:480; I.V.:36.6] Out: 240 [Urine:240]   Physical Exam:  Cardiovascular: RRR Pulmonary: Mostly clear to auscultation bilaterally Abdomen: Soft, protuberant,non tender, bowel sounds present. Extremities: Bilateral lower extremity edema. Wounds: Clean and dry.  No erythema or signs of infection.  Lab Results: CBC: Recent Labs    04/03/17 0402 04/04/17 0331  WBC 12.0* 10.0  HGB 9.9* 9.6*  HCT 30.2* 29.0*  PLT 90* 120*   BMET:  Recent Labs    04/03/17 0402 04/04/17 0331  NA 134* 134*  K 3.4* 3.7  CL 99* 100*  CO2 27 26  GLUCOSE 140* 122*  BUN 26* 27*  CREATININE 0.80 0.86  CALCIUM 7.9* 7.7*    PT/INR:  Lab Results  Component Value Date   INR 1.31 03/31/2017   INR 1.12 03/30/2017   INR 1.03 03/29/2017   ABG:  INR: Will add last result for INR, ABG once components are  confirmed Will add last 4 CBG results once components are confirmed  Assessment/Plan:  1. CV - S/p cardiogenic shock,STEMI. SR in the 80's this am. Co ox this am decreased this am to 45.7. Dr. Cornelius Moraswen to decide whether or not to restart Milrinone. On Coreg 3.125 mg bid, Lisinopril 2.5 mg daily, Spironolactone 12.5 mg daily, and DAPT (ecasa 81 mg and Plavix 75 mg daily).  2.  Pulmonary - On room air. Encourage incentive spirometer. 3. Volume Overload - On Lasix 40 mg IV bid 4.  Acute blood loss anemia - H and H stable at 9.6 and 29. 5. Thrombocytopenia-platelets increased to 120,000 6. Remove EPW 7. Benadryl PRN sleep   Donielle M ZimmermanPA-C 04/05/2017,7:34 AM    I have seen and examined the patient and agree with the assessment and plan as outlined.  Co-ox down 46% this morning but physical exam looks okay.  Will recheck and consider resuming milrinone pending result.  Increase lasix and continue Cleda DaubSpiro.  Mobilize.  Continue low dose carvedilol and lisinopril for now.  Purcell Nailslarence H Gildo Crisco, MD 04/05/2017 11:03 AM   Addendum:  Repeat co-ox 62%.  Continue current plan.  Purcell Nailslarence H Minda Faas, MD 04/05/2017 11:59 AM

## 2017-04-05 NOTE — Progress Notes (Signed)
Verbal order received from Doree Fudgeonielle Zimmerman, PA  to DC daily coox labs.

## 2017-04-05 NOTE — Progress Notes (Signed)
Patient education completed for removal of pacer wires, patient verbalized understanding, pacer wires removed as ordered, procedure well tolerated, patient now on bedrest, will continue to monitor.  

## 2017-04-05 NOTE — Consult Note (Addendum)
Advanced Heart Failure Team Consult Note   HF Cardiology: Shirlee LatchMcLean    Reason for Consultation: CHF, post-CABG  HPI:    Ryan Durham is seen today for evaluation of CHF at the request of Dr. Cornelius Moraswen.   62 yo with no prior medical care developed chest pain and palpitations the day of admission.  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  He has smoked 1-1.5 ppd.  At admission, he was noted to have anterolateral ST depression and atrial fibrillation with RVR.  He converted to NSR soon after arrival.  Troponin noted to be elevated.  Echo showed EF 25-30%.  He was taken for cath, found to have severe 3VD and underwent CABG x 5.    He is now post-op CABG.  Milrinone was stopped yesterday, co-ox 62% this morning.  He remains in NSR.  Pacing wires removed today.  He walked this morning, mild dyspnea going down the hall.  Mild soreness at sternotomy.   Review of Systems: All systems reviewed and negative except as per HPI.   Home Medications Prior to Admission medications   Not on File    Past Medical History: 1. Hyperlipidemia 2. CAD: NSTEMI 1/19 with LHC showing total occlusion ostial LAD, total occlusion D1, 99% ostial LCx, 80% proximal RCA.  - CABG with LIMA-LAD, SVG-PDA, SVG-PLV, SVG-distal LCx, SVG-ramus.  3. Ischemic cardiomyopathy: Echo 1/19 with EF 25-30%, mild LV dilation.  4. Paroxysmal atrial fibrillation: Noted when patient was admitted with NSTEMI in 1/19, no recurrence.   Past Surgical History: Past Surgical History:  Procedure Laterality Date  . CLIPPING OF ATRIAL APPENDAGE Left 03/31/2017   Procedure: CLIPPING OF ATRIAL APPENDAGE;  Surgeon: Purcell Nailswen, Clarence H, MD;  [facility];  Service: Open Heart Surgery;  Laterality: Left;  . CORONARY ANGIOPLASTY  03/30/2017  . CORONARY ARTERY BYPASS GRAFT N/A 03/31/2017   Procedure: CORONARY ARTERY BYPASS GRAFTING (CABG) time 5 using bilateral saphaneous vien, harvested endoscopicly and right  internal mammary artery.;  Surgeon: Purcell Nailswen, Clarence H, MD;  [facility];  Service: Open Heart Surgery;  Laterality: N/A;  . CORONARY/GRAFT ACUTE MI REVASCULARIZATION N/A 03/29/2017   Procedure: Coronary/Graft Acute MI Revascularization;  Surgeon: Kathleene HazelMcAlhany, Christopher D, MD;  Location: [facility] INVASIVE CV LAB;  Service: Cardiovascular;  Laterality: N/A;  . IABP INSERTION N/A 03/29/2017   Procedure: IABP Insertion;  Surgeon: Kathleene HazelMcAlhany, Christopher D, MD;  Location: [facility] INVASIVE CV LAB;  Service: Cardiovascular;  Laterality: N/A;  . LEFT HEART CATH AND CORONARY ANGIOGRAPHY N/A 03/29/2017   Procedure: LEFT HEART CATH AND CORONARY ANGIOGRAPHY;  Surgeon: Kathleene HazelMcAlhany, Christopher D, MD;  Location: [facility] INVASIVE CV LAB;  Service: Cardiovascular;  Laterality: N/A;  . TEE WITHOUT CARDIOVERSION N/A 03/31/2017   Procedure: TRANSESOPHAGEAL ECHOCARDIOGRAM (TEE);  Surgeon: Purcell Nailswen, Clarence H, MD;  [facility];  Service: Open Heart Surgery;  Laterality: N/A;    Family History: Family History  Problem Relation Age of Onset  . Cirrhosis Mother   . Heart attack Sister   . Heart attack Brother  30s    Social History: Social History   Socioeconomic History  . Marital status: Married    Spouse name: None  . Number of children: None  . Years of education: None  . Highest education level: None  Social Needs  . Financial resource strain: None  . Food insecurity - worry: None  . Food insecurity - inability: None  . Transportation needs - medical: None  .  Transportation needs - non-medical: None  Occupational History  . None  Tobacco Use  . Smoking status: Current Every Day Smoker    Packs/day: 1.00    Years: 30.00    Pack years: 30.00    Types: Cigarettes  . Smokeless tobacco: Never Used  Substance and Sexual Activity  . Alcohol use: No    Frequency: Never  . Drug use: No  . Sexual activity: None  Other Topics Concern  . None  Social History Narrative   Works in Naval architect for [employer]     Allergies:  No Known Allergies  Objective:    Vital Signs:   Temp:  [97.7 F (36.5 C)-99.1 F (37.3 C)] 97.7 F (36.5 C) (01/13 0844) Pulse Rate:  [74-84] 84 (01/13 1237) Resp:  [17-21] 17 (01/13 1237) BP: (95-120)/(60-81) 120/70 (01/13 1237) SpO2:  [91 %-97 %] 95 % (01/13 0844) Weight:  [230 lb 1.6 oz (104.4 kg)] 230 lb 1.6 oz (104.4 kg) (01/13 0520) Last BM Date: 04/04/17  Weight change: Filed Weights   04/03/17 0500 04/04/17 0445 04/05/17 0520  Weight: 229 lb 4.5 oz (104 kg) 227 lb 14.4 oz (103.4 kg) 230 lb 1.6 oz (104.4 kg)    Intake/Output:   Intake/Output Summary (Last 24 hours) at 04/05/2017 1308 Last data filed at 04/05/2017 0624 Gross per 24 hour  Intake 756.6 ml  Output -  Net 756.6 ml      Physical Exam    General:  Well appearing. No resp difficulty HEENT: normal Neck: supple. JVP 12 cm. Carotids 2+ bilat; no bruits. No lymphadenopathy or thyromegaly appreciated. Cor: PMI nondisplaced. Regular rate & rhythm. No rubs, gallops or murmurs. Lungs: Rhonchi bilaterally Abdomen: soft, nontender, nondistended. No hepatosplenomegaly. No bruits or masses. Good bowel sounds. Extremities: no cyanosis, clubbing, rash. 1+ edema 1/2 to knees bilaterally.  Neuro: alert & orientedx3, cranial nerves grossly intact. moves all 4 extremities w/o difficulty. Affect pleasant   Telemetry   NSR, personally reviewed  EKG    NSR, old ASMI, narrow QRS  Labs   Basic Metabolic Panel: Recent Labs  Lab 03/29/17 2125  03/31/17 0528  03/31/17 1947  04/01/17 0401 04/01/17 1617 04/01/17 1624 04/02/17 0407 04/03/17 0402 04/04/17 0331  NA 134*   < > 136   < >  --    < > 137  --  140 135 134* 134*  K 5.2*   < > 4.1   < >  --    < > 4.0  --  3.8 3.9 3.4* 3.7  CL 99*   < > 100*   < >  --    < > 107  --  105 103 99* 100*  CO2 22   < > 25  --   --   --  23  --   --  25 27 26   GLUCOSE 258*   < > 147*   < >  --    < > 108*  --  156* 202* 140* 122*  BUN 14   < > 23*   < >   --    < > 21*  --  23* 26* 26* 27*  CREATININE 1.02   < > 1.04   < > 0.94   < > 0.91 1.00 0.70 1.09 0.80 0.86  CALCIUM 9.4   < > 8.8*  --   --   --  7.8*  --   --  7.9* 7.9* 7.7*  MG 2.0  --   --   --  2.8*  --  2.3 2.2  --   --   --   --    < > = values in this interval not displayed.    Liver Function Tests: Recent Labs  Lab 03/30/17 1525 04/02/17 0407 04/03/17 0402  AST 472* 201* 116*  ALT 129* 173* 137*  ALKPHOS 85 56 67  BILITOT 1.3* 1.3* 1.1  PROT 7.0 5.4* 5.6*  ALBUMIN 3.4* 3.0* 2.8*   No results for input(s): LIPASE, AMYLASE in the last 168 hours. No results for input(s): AMMONIA in the last 168 hours.  CBC: Recent Labs  Lab 03/29/17 2125  04/01/17 0401 04/01/17 1617 04/01/17 1624 04/02/17 0407 04/03/17 0402 04/04/17 0331  WBC 17.1*   < > 15.6* 17.4*  --  16.9* 12.0* 10.0  NEUTROABS 13.6*  --   --   --   --   --   --   --   HGB 17.8*   < > 11.0* 10.7* 9.9* 10.3* 9.9* 9.6*  HCT 51.5   < > 33.3* 31.4* 29.0* 31.0* 30.2* 29.0*  MCV 97.4   < > 97.7 99.1  --  98.7 98.7 99.3  PLT 190   < > 130* 98*  --  85* 90* 120*   < > = values in this interval not displayed.    Cardiac Enzymes: Recent Labs  Lab 03/29/17 2153 03/30/17 1525  TROPONINI 1.35* >65.00*    BNP: BNP (last 3 results) Recent Labs    03/30/17 1525  BNP 617.3*    ProBNP (last 3 results) No results for input(s): PROBNP in the last 8760 hours.   CBG: Recent Labs  Lab 04/04/17 1117 04/04/17 1653 04/04/17 2040 04/05/17 0605 04/05/17 1111  GLUCAP 157* 131* 188* 139* 130*    Coagulation Studies: No results for input(s): LABPROT, INR in the last 72 hours.   Imaging    No results found.   Medications:     Current Medications: . acetaminophen  1,000 mg Oral Q6H  . aspirin EC  81 mg Oral Daily  . atorvastatin  80 mg Oral q1800  . bisacodyl  10 mg Oral Daily   Or  . bisacodyl  10 mg Rectal Daily  . carvedilol  3.125 mg Oral BID WC  . Chlorhexidine Gluconate Cloth  6 each  Topical Daily  . Chlorhexidine Gluconate Cloth  6 each Topical Daily  . clopidogrel  75 mg Oral Daily  . docusate sodium  200 mg Oral Daily  . furosemide  40 mg Intravenous Q8H  . [START ON 04/06/2017] losartan  25 mg Oral Daily  . mouth rinse  15 mL Mouth Rinse BID  . moving right along book   Does not apply Once  . pantoprazole  40 mg Oral Daily  . potassium chloride  20 mEq Oral BID  . sodium chloride flush  10-40 mL Intracatheter Q12H  . sodium chloride flush  10-40 mL Intracatheter Q12H  . sodium chloride flush  3 mL Intravenous Q12H  . spironolactone  12.5 mg Oral Daily     Infusions: . sodium chloride    . lactated ringers 10 mL/hr (04/03/17 1153)       Patient Profile   62 yo smoker was admitted with NSTEMI, acute systolic CHF, atrial fibrillation with RVR.  He is now s/p CABG.   Assessment/Plan   1. Acute systolic CHF: Ischemic cardiomyopathy, now s/p CABG.  Co-ox 62% today off milrinone.  He is volume overloaded on exam with JVD.  -  Lasix 40 mg IV every 8 hrs today, may need to increase dose but will follow response.  Need I/Os followed and daily standing weights.  - Continue Coreg 3.125 mg bid and spironolactone 12.5 daily.  - Stop lisinopril, start losartan 12.5 mg daily tomorrow.  May eventually be able to transition to [facility] depending on BP.  - Repeat echo 3 months, ?ICD.  Narrow QRS, not CRT candidate.  2. CAD: Strong FH premature CAD.  NSTEMI, now s/p CABG.  - Continue ASA 81, Plavix, and statin.  3. Atrial fibrillation: Paroxysmal.  Only noted at initial admission in setting of severe CP/NSTEMI.  Will follow for recurrence.  If recurs, he will need to be anticoagulated (holding off for now).  4. Smoking: I urged him to quit.   Length of Stay: 6  Marca Ancona, MD  04/05/2017, 1:08 PM  Advanced Heart Failure Team Pager [phone] (M-F; 7a - 4p)  Please contact [facility] for night-coverage after hours (4p -7a ) and weekends on [email or web address]

## 2017-04-06 LAB — BASIC METABOLIC PANEL
Anion gap: 10 (ref 5–15)
BUN: 28 mg/dL — AB (ref 6–20)
CHLORIDE: 98 mmol/L — AB (ref 101–111)
CO2: 26 mmol/L (ref 22–32)
CREATININE: 0.79 mg/dL (ref 0.61–1.24)
Calcium: 8.1 mg/dL — ABNORMAL LOW (ref 8.9–10.3)
GFR calc Af Amer: >60 mL/min (ref >60)
GFR calc non Af Amer: >60 mL/min (ref >60)
Glucose, Bld: 153 mg/dL — ABNORMAL HIGH (ref 65–99)
Potassium: 3.7 mmol/L (ref 3.5–5.1)
Sodium: 134 mmol/L — ABNORMAL LOW (ref 135–145)

## 2017-04-06 LAB — CBC WITH DIFFERENTIAL/PLATELET
Basophils Absolute: 0.1 10*3/uL (ref 0.0–0.1)
Basophils Relative: 1 %
EOS ABS: 0.4 10*3/uL (ref 0.0–0.7)
Eosinophils Relative: 4 %
HCT: 31.7 % — ABNORMAL LOW (ref 39.0–52.0)
HEMOGLOBIN: 10.4 g/dL — AB (ref 13.0–17.0)
LYMPHS ABS: 2.5 10*3/uL (ref 0.7–4.0)
Lymphocytes Relative: 25 %
MCH: 32.6 pg (ref 26.0–34.0)
MCHC: 32.8 g/dL (ref 30.0–36.0)
MCV: 99.4 fL (ref 78.0–100.0)
MONOS PCT: 9 %
Monocytes Absolute: 0.9 10*3/uL (ref 0.1–1.0)
Neutro Abs: 6.4 10*3/uL (ref 1.7–7.7)
Neutrophils Relative %: 61 %
Platelets: 187 10*3/uL (ref 150–400)
RBC: 3.19 MIL/uL — ABNORMAL LOW (ref 4.22–5.81)
RDW: 14.4 % (ref 11.5–15.5)
WBC: 10.4 10*3/uL (ref 4.0–10.5)

## 2017-04-06 MED ORDER — POTASSIUM CHLORIDE CRYS ER 20 MEQ PO TBCR
40.0000 meq | EXTENDED_RELEASE_TABLET | Freq: Once | ORAL | Status: AC
  Administered 2017-04-06: 40 meq via ORAL
  Filled 2017-04-06: qty 2

## 2017-04-06 NOTE — Progress Notes (Signed)
Patient ID: Ryan Durham, male   DOB: 12/30/1955, 62 y.o.   MRN: [id number]     Advanced Heart Failure Rounding Note HF Cardiology: Shirlee LatchMcLean  Subjective:    Patient diuresed well yesterday, weight down 3 lbs.  No complaints this morning, walking in hall without dyspnea.  Not dizzy though SBP to 90s at times.    Objective:   Weight Range: 227 lb 3.2 oz (103.1 kg) Body mass index is 33.55 kg/m.   Vital Signs:   Temp:  [96.3 F (35.7 C)-98.2 F (36.8 C)] 98.2 F (36.8 C) (01/14 0505) Pulse Rate:  [58-84] 80 (01/14 0502) Resp:  [16-20] 20 (01/14 0502) BP: (92-120)/(62-75) 118/70 (01/14 0502) SpO2:  [100 %] 100 % (01/14 0502) Weight:  [227 lb 3.2 oz (103.1 kg)] 227 lb 3.2 oz (103.1 kg) (01/14 0505) Last BM Date: 04/05/17  Weight change: Filed Weights   04/04/17 0445 04/05/17 0520 04/06/17 0505  Weight: 227 lb 14.4 oz (103.4 kg) 230 lb 1.6 oz (104.4 kg) 227 lb 3.2 oz (103.1 kg)    Intake/Output:   Intake/Output Summary (Last 24 hours) at 04/06/2017 0845 Last data filed at 04/06/2017 0700 Gross per 24 hour  Intake 900 ml  Output 1875 ml  Net -975 ml      Physical Exam    General:  Well appearing. No resp difficulty HEENT: Normal Neck: Supple. JVP 10-11 cm. Carotids 2+ bilat; no bruits. No lymphadenopathy or thyromegaly appreciated. Cor: PMI nondisplaced. Regular rate & rhythm. No rubs, gallops or murmurs. Lungs: Clear Abdomen: Soft, nontender, nondistended. No hepatosplenomegaly. No bruits or masses. Good bowel sounds. Extremities: No cyanosis, clubbing, rash. 1+ edema to knees bilaterally Neuro: Alert & orientedx3, cranial nerves grossly intact. moves all 4 extremities w/o difficulty. Affect pleasant   Telemetry   NSR in 80s (personally reviewed)   Labs    CBC Recent Labs    04/04/17 0331 04/06/17 0359  WBC 10.0 10.4  NEUTROABS  --  6.4  HGB 9.6* 10.4*  HCT 29.0* 31.7*  MCV 99.3 99.4  PLT 120* 187   Basic Metabolic Panel Recent Labs     04/05/17 1707 04/06/17 0359  NA 135 134*  K 3.8 3.7  CL 99* 98*  CO2 25 26  GLUCOSE 151* 153*  BUN 31* 28*  CREATININE 0.93 0.79  CALCIUM 8.1* 8.1*   Liver Function Tests No results for input(s): AST, ALT, ALKPHOS, BILITOT, PROT, ALBUMIN in the last 72 hours. No results for input(s): LIPASE, AMYLASE in the last 72 hours. Cardiac Enzymes No results for input(s): CKTOTAL, CKMB, CKMBINDEX, TROPONINI in the last 72 hours.  BNP: BNP (last 3 results) Recent Labs    03/30/17 1525  BNP 617.3*    ProBNP (last 3 results) No results for input(s): PROBNP in the last 8760 hours.   D-Dimer No results for input(s): DDIMER in the last 72 hours. Hemoglobin A1C No results for input(s): HGBA1C in the last 72 hours. Fasting Lipid Panel No results for input(s): CHOL, HDL, LDLCALC, TRIG, CHOLHDL, LDLDIRECT in the last 72 hours. Thyroid Function Tests No results for input(s): TSH, T4TOTAL, T3FREE, THYROIDAB in the last 72 hours.  Invalid input(s): FREET3  Other results:   Imaging     No results found.   Medications:     Scheduled Medications: . aspirin EC  81 mg Oral Daily  . atorvastatin  80 mg Oral q1800  . bisacodyl  10 mg Oral Daily   Or  . bisacodyl  10 mg Rectal Daily  .  carvedilol  3.125 mg Oral BID WC  . Chlorhexidine Gluconate Cloth  6 each Topical Daily  . Chlorhexidine Gluconate Cloth  6 each Topical Daily  . clopidogrel  75 mg Oral Daily  . docusate sodium  200 mg Oral Daily  . furosemide  40 mg Intravenous Q8H  . losartan  25 mg Oral Daily  . mouth rinse  15 mL Mouth Rinse BID  . moving right along book   Does not apply Once  . pantoprazole  40 mg Oral Daily  . potassium chloride  20 mEq Oral BID  . potassium chloride  40 mEq Oral Once  . sodium chloride flush  10-40 mL Intracatheter Q12H  . sodium chloride flush  10-40 mL Intracatheter Q12H  . sodium chloride flush  3 mL Intravenous Q12H  . spironolactone  12.5 mg Oral Daily     Infusions: .  sodium chloride    . lactated ringers 10 mL/hr (04/03/17 1153)     PRN Medications:  diphenhydrAMINE, levalbuterol, metoprolol tartrate, morphine injection, ondansetron (ZOFRAN) IV, oxyCODONE, sodium chloride flush, sodium chloride flush, sodium chloride flush, traMADol    Patient Profile   62 yo smoker was admitted with NSTEMI, acute systolic CHF, atrial fibrillation with RVR.  He is now s/p CABG.   Assessment/Plan   1. Acute systolic CHF: Ischemic cardiomyopathy, now s/p CABG.  Co-ox was good off milrinone.  He is volume overloaded on exam with JVD.  Diuresed well with IV Lasix yesterday, weight down 3 lbs.  - Lasix 40 mg IV every 8 hrs again today.  Need I/Os followed and daily standing weights.  - Continue Coreg 3.125 mg bid, losartan 25 mg daily, and spironolactone 12.5 daily. Will not titrate meds today with soft BP.  - Repeat echo 3 months, ?ICD.  Narrow QRS, not CRT candidate.  2. CAD: Strong FH premature CAD.  NSTEMI, now s/p CABG.  - Continue ASA 81, Plavix, and statin.  3. Atrial fibrillation: Paroxysmal.  Only noted at initial admission in setting of severe CP/NSTEMI.  Will follow for recurrence.  If recurs, he will need to be anticoagulated (holding off for now).  4. Smoking: I urged him to quit.    Length of Stay: 7  Marca Ancona, MD  04/06/2017, 8:45 AM  Advanced Heart Failure Team Pager [phone] (M-F; 7a - 4p)  Please contact [facility] for night-coverage after hours (4p -7a ) and weekends on [email or web address]

## 2017-04-06 NOTE — Progress Notes (Addendum)
      [address]       [place] [id number]             [phone]      6 Days Post-Op Procedure(s) (LRB): CORONARY ARTERY BYPASS GRAFTING (CABG) time 5 using bilateral saphaneous vien, harvested endoscopicly and right internal mammary artery. (N/A) CLIPPING OF ATRIAL APPENDAGE (Left) TRANSESOPHAGEAL ECHOCARDIOGRAM (TEE) (N/A)   Subjective:  No new complaints.  Looks great, continues to have some incisional discomfort and gets light headed at times.  Objective: Vital signs in last 24 hours: Temp:  [96.3 F (35.7 C)-98.2 F (36.8 C)] 98.2 F (36.8 C) (01/14 0505) Pulse Rate:  [58-84] 80 (01/14 0502) Cardiac Rhythm: Normal sinus rhythm (01/13 1915) Resp:  [16-20] 20 (01/14 0502) BP: (92-120)/(62-75) 118/70 (01/14 0502) SpO2:  [95 %-100 %] 100 % (01/14 0502) Weight:  [227 lb 3.2 oz (103.1 kg)] 227 lb 3.2 oz (103.1 kg) (01/14 0505)  Intake/Output from previous day: 01/13 0701 - 01/14 0700 In: 1140 [P.O.:1020; I.V.:120] Out: 2575 [Urine:2575]  General appearance: alert, cooperative and no distress Heart: regular rate and rhythm Lungs: clear to auscultation bilaterally Abdomen: soft, non-tender; bowel sounds normal; no masses,  no organomegaly Extremities: edema 1+ pitting Wound: clean and dry  Lab Results: Recent Labs    04/04/17 0331 04/06/17 0359  WBC 10.0 10.4  HGB 9.6* 10.4*  HCT 29.0* 31.7*  PLT 120* 187   BMET:  Recent Labs    04/05/17 1707 04/06/17 0359  NA 135 134*  K 3.8 3.7  CL 99* 98*  CO2 25 26  GLUCOSE 151* 153*  BUN 31* 28*  CREATININE 0.93 0.79  CALCIUM 8.1* 8.1*    PT/INR: No results for input(s): LABPROT, INR in the last 72 hours. ABG    Component Value Date/Time   PHART 7.402 04/01/2017 1051   HCO3 23.4 04/01/2017 1051   TCO2 21 (L) 04/01/2017 1624   ACIDBASEDEF 1.0 04/01/2017 1051   O2SAT 62.4 04/05/2017 1130   CBG (last 3)  Recent Labs    04/05/17 0605 04/05/17 1111 04/05/17 1635  GLUCAP 139* 130* 151*      Assessment/Plan: S/P Procedure(s) (LRB): CORONARY ARTERY BYPASS GRAFTING (CABG) time 5 using bilateral saphaneous vien, harvested endoscopicly and right internal mammary artery. (N/A) CLIPPING OF ATRIAL APPENDAGE (Left) TRANSESOPHAGEAL ECHOCARDIOGRAM (TEE) (N/A)  1. CV- NSR, BP controlled- continue Coreg at 3.25 mg, Losartan to start today if BP tolerates... AHF managing 2. Pulm- no acute issues, long term smoking use, continue IS 3. Renal- creatinine WNL, weight is up and he remains edematous-- on IV Lasix and Spironolactone, K at 3.7 watch with spiro, will continue supplementation for now 4. Expected blood loss anemia, mild remains stable at 10.4 5. Dispo- patient looks great, continue diuretics, start Cozaar if BP tolerates, medications per AHF   LOS: 7 days    Erin Barrett 04/06/2017   I have seen and examined the patient and agree with the assessment and plan as outlined.  Co-ox not done this morning.  Still needs diuresis but overall looks good.  Purcell Nailslarence H Owen, MD 04/06/2017 8:27 AM

## 2017-04-06 NOTE — Progress Notes (Signed)
CARDIAC REHAB PHASE I   PRE:  Rate/Rhythm: 76 SR    BP: sitting 95/59    SaO2: 97 RA  MODE:  Ambulation: 790 ft   POST:  Rate/Rhythm: 91 SR    BP: sitting 120/66     SaO2: 97 RA  Pt moving independently, stood and walked with RW (still likes to use RW for security). No c/o while walking, slow, steady pace. To recliner after walk. VSS. Encouraged more walking with family today (due to IV pole).  [phone]   Harriet MassonRandi Kristan Kyreese Chio CES, ACSM 04/06/2017 8:52 AM

## 2017-04-07 LAB — CBC WITH DIFFERENTIAL/PLATELET
Basophils Absolute: 0.1 10*3/uL (ref 0.0–0.1)
Basophils Relative: 1 %
Eosinophils Absolute: 0.4 10*3/uL (ref 0.0–0.7)
Eosinophils Relative: 4 %
HCT: 31.4 % — ABNORMAL LOW (ref 39.0–52.0)
Hemoglobin: 10.4 g/dL — ABNORMAL LOW (ref 13.0–17.0)
LYMPHS ABS: 2.7 10*3/uL (ref 0.7–4.0)
LYMPHS PCT: 26 %
MCH: 32.9 pg (ref 26.0–34.0)
MCHC: 33.1 g/dL (ref 30.0–36.0)
MCV: 99.4 fL (ref 78.0–100.0)
Monocytes Absolute: 0.9 10*3/uL (ref 0.1–1.0)
Monocytes Relative: 9 %
Neutro Abs: 6.3 10*3/uL (ref 1.7–7.7)
Neutrophils Relative %: 60 %
PLATELETS: 210 10*3/uL (ref 150–400)
RBC: 3.16 MIL/uL — AB (ref 4.22–5.81)
RDW: 14.5 % (ref 11.5–15.5)
WBC: 10.4 10*3/uL (ref 4.0–10.5)

## 2017-04-07 LAB — BASIC METABOLIC PANEL
Anion gap: 12 (ref 5–15)
BUN: 30 mg/dL — AB (ref 6–20)
CHLORIDE: 96 mmol/L — AB (ref 101–111)
CO2: 24 mmol/L (ref 22–32)
Calcium: 8.2 mg/dL — ABNORMAL LOW (ref 8.9–10.3)
Creatinine, Ser: 0.96 mg/dL (ref 0.61–1.24)
GFR calc Af Amer: >60 mL/min (ref >60)
GFR calc non Af Amer: >60 mL/min (ref >60)
GLUCOSE: 123 mg/dL — AB (ref 65–99)
POTASSIUM: 4 mmol/L (ref 3.5–5.1)
SODIUM: 132 mmol/L — AB (ref 135–145)

## 2017-04-07 MED ORDER — POTASSIUM CHLORIDE CRYS ER 20 MEQ PO TBCR
20.0000 meq | EXTENDED_RELEASE_TABLET | Freq: Once | ORAL | Status: AC
  Administered 2017-04-07: 20 meq via ORAL
  Filled 2017-04-07: qty 1

## 2017-04-07 MED ORDER — FUROSEMIDE 10 MG/ML IJ SOLN
80.0000 mg | Freq: Two times a day (BID) | INTRAMUSCULAR | Status: DC
  Administered 2017-04-07 – 2017-04-08 (×3): 80 mg via INTRAVENOUS
  Filled 2017-04-07 (×3): qty 8

## 2017-04-07 MED ORDER — FUROSEMIDE 10 MG/ML IJ SOLN
40.0000 mg | Freq: Once | INTRAMUSCULAR | Status: AC
  Administered 2017-04-07: 40 mg via INTRAVENOUS
  Filled 2017-04-07: qty 4

## 2017-04-07 NOTE — Progress Notes (Addendum)
      [address]       [place] [id number]             [phone]      7 Days Post-Op Procedure(s) (LRB): CORONARY ARTERY BYPASS GRAFTING (CABG) time 5 using bilateral saphaneous vien, harvested endoscopicly and right internal mammary artery. (N/A) CLIPPING OF ATRIAL APPENDAGE (Left) TRANSESOPHAGEAL ECHOCARDIOGRAM (TEE) (N/A)   Subjective:  Patient states he feels sleepy this morning.  He continues to have sternal discomfort.  + ambulation  + BM  Objective: Vital signs in last 24 hours: Temp:  [98.3 F (36.8 C)-98.8 F (37.1 C)] 98.3 F (36.8 C) (01/15 0428) Pulse Rate:  [75-83] 76 (01/15 0428) Cardiac Rhythm: Normal sinus rhythm (01/14 1900) Resp:  [18-26] 22 (01/15 0428) BP: (92-109)/(62-70) 92/65 (01/15 0428) SpO2:  [92 %-93 %] 93 % (01/15 0428) Weight:  [227 lb 3.2 oz (103.1 kg)] 227 lb 3.2 oz (103.1 kg) (01/15 0428)  Intake/Output from previous day: 01/14 0701 - 01/15 0700 In: 516 [P.O.:386; I.V.:130] Out: 2325 [Urine:2325] Intake/Output this shift: Total I/O In: 75 [I.V.:75] Out: -   General appearance: alert, cooperative and no distress Heart: regular rate and rhythm Lungs: clear to auscultation bilaterally Abdomen: soft, non-tender; bowel sounds normal; no masses,  no organomegaly Extremities: edema trace Wound: clean and dry  Lab Results: Recent Labs    04/06/17 0359 04/07/17 0423  WBC 10.4 10.4  HGB 10.4* 10.4*  HCT 31.7* 31.4*  PLT 187 210   BMET:  Recent Labs    04/06/17 0359 04/07/17 0423  NA 134* 132*  K 3.7 4.0  CL 98* 96*  CO2 26 24  GLUCOSE 153* 123*  BUN 28* 30*  CREATININE 0.79 0.96  CALCIUM 8.1* 8.2*    PT/INR: No results for input(s): LABPROT, INR in the last 72 hours. ABG    Component Value Date/Time   PHART 7.402 04/01/2017 1051   HCO3 23.4 04/01/2017 1051   TCO2 21 (L) 04/01/2017 1624   ACIDBASEDEF 1.0 04/01/2017 1051   O2SAT 62.4 04/05/2017 1130   CBG (last 3)  Recent Labs    04/05/17 0605  04/05/17 1111 04/05/17 1635  GLUCAP 139* 130* 151*    Assessment/Plan: S/P Procedure(s) (LRB): CORONARY ARTERY BYPASS GRAFTING (CABG) time 5 using bilateral saphaneous vien, harvested endoscopicly and right internal mammary artery. (N/A) CLIPPING OF ATRIAL APPENDAGE (Left) TRANSESOPHAGEAL ECHOCARDIOGRAM (TEE) (N/A)  1. CV- NSR, remains on Coreg, Losartan per AHF 2. Pulm- no acute issues, continue IS 3. Renal- creatinine WNL, K at 4.0.. On IV Lasix, Spironolactone per AHF, weight remains stable, edema improved 4. Dispo- patient stable, continued medication adjustments per AHF, continue diuretics   LOS: 8 days    Ryan Durham 04/07/2017  I have seen and examined the patient and agree with the assessment and plan as outlined.  Purcell Nailslarence H Carianne Taira, MD 04/07/2017 10:33 AM

## 2017-04-07 NOTE — Progress Notes (Signed)
Patient ID: Ryan Durham, male   DOB: 09-12-1955, 62 y.o.   MRN: [id number]     Advanced Heart Failure Rounding Note HF Cardiology: Ryan Durham  Subjective:    Doing OK this am, but tired. Had mild lightheadedness in shower, but otherwise doing Ok. Walked the halls without difficult. Still sore at surgical site.   Negative 1.8 L though weight stable. ? Accuracy.   Creatinine and K stable.   Objective:   Weight Range: 227 lb 3.2 oz (103.1 kg) Body mass index is 33.55 kg/m.   Vital Signs:   Temp:  [98.3 F (36.8 C)-98.8 F (37.1 C)] 98.3 F (36.8 C) (01/15 0428) Pulse Rate:  [75-83] 76 (01/15 0428) Resp:  [18-26] 22 (01/15 0428) BP: (92-109)/(62-70) 92/65 (01/15 0428) SpO2:  [92 %-93 %] 93 % (01/15 0428) Weight:  [227 lb 3.2 oz (103.1 kg)] 227 lb 3.2 oz (103.1 kg) (01/15 0428) Last BM Date: 04/06/17  Weight change: Filed Weights   04/05/17 0520 04/06/17 0505 04/07/17 0428  Weight: 230 lb 1.6 oz (104.4 kg) 227 lb 3.2 oz (103.1 kg) 227 lb 3.2 oz (103.1 kg)    Intake/Output:   Intake/Output Summary (Last 24 hours) at 04/07/2017 1056 Last data filed at 04/07/2017 1002 Gross per 24 hour  Intake 591 ml  Output 2475 ml  Net -1884 ml     Physical Exam    General: Well appearing. No resp difficulty. HEENT: Normal Neck: Supple. JVP at least 10 cm. Carotids 2+ bilat; no bruits. No thyromegaly or nodule noted. Cor: PMI nondisplaced. RRR, No M/G/R noted Lungs: CTAB, normal effort. Abdomen: Soft, non-tender, non-distended, no HSM. No bruits or masses. +BS  Extremities: No cyanosis, clubbing, or rash. Trace to 1+ edema to knees.   Neuro: Alert & orientedx3, cranial nerves grossly intact. moves all 4 extremities w/o difficulty. Affect pleasant   Telemetry   NSR 80s, personally reviewed.   Labs    CBC Recent Labs    04/06/17 0359 04/07/17 0423  WBC 10.4 10.4  NEUTROABS 6.4 6.3  HGB 10.4* 10.4*  HCT 31.7* 31.4*  MCV 99.4 99.4  PLT 187 210   Basic Metabolic  Panel Recent Labs    04/06/17 0359 04/07/17 0423  NA 134* 132*  K 3.7 4.0  CL 98* 96*  CO2 26 24  GLUCOSE 153* 123*  BUN 28* 30*  CREATININE 0.79 0.96  CALCIUM 8.1* 8.2*   Liver Function Tests No results for input(s): AST, ALT, ALKPHOS, BILITOT, PROT, ALBUMIN in the last 72 hours. No results for input(s): LIPASE, AMYLASE in the last 72 hours. Cardiac Enzymes No results for input(s): CKTOTAL, CKMB, CKMBINDEX, TROPONINI in the last 72 hours.  BNP: BNP (last 3 results) Recent Labs    03/30/17 1525  BNP 617.3*    ProBNP (last 3 results) No results for input(s): PROBNP in the last 8760 hours.   D-Dimer No results for input(s): DDIMER in the last 72 hours. Hemoglobin A1C No results for input(s): HGBA1C in the last 72 hours. Fasting Lipid Panel No results for input(s): CHOL, HDL, LDLCALC, TRIG, CHOLHDL, LDLDIRECT in the last 72 hours. Thyroid Function Tests No results for input(s): TSH, T4TOTAL, T3FREE, THYROIDAB in the last 72 hours.  Invalid input(s): FREET3  Other results:   Imaging    No results found.   Medications:     Scheduled Medications: . aspirin EC  81 mg Oral Daily  . atorvastatin  80 mg Oral q1800  . bisacodyl  10 mg Oral Daily  Or  . bisacodyl  10 mg Rectal Daily  . carvedilol  3.125 mg Oral BID WC  . Chlorhexidine Gluconate Cloth  6 each Topical Daily  . Chlorhexidine Gluconate Cloth  6 each Topical Daily  . clopidogrel  75 mg Oral Daily  . docusate sodium  200 mg Oral Daily  . furosemide  40 mg Intravenous Q8H  . losartan  25 mg Oral Daily  . mouth rinse  15 mL Mouth Rinse BID  . moving right along book   Does not apply Once  . pantoprazole  40 mg Oral Daily  . potassium chloride  20 mEq Oral BID  . sodium chloride flush  10-40 mL Intracatheter Q12H  . sodium chloride flush  10-40 mL Intracatheter Q12H  . sodium chloride flush  3 mL Intravenous Q12H  . spironolactone  12.5 mg Oral Daily    Infusions: . sodium chloride       PRN Medications: diphenhydrAMINE, levalbuterol, metoprolol tartrate, morphine injection, ondansetron (ZOFRAN) IV, oxyCODONE, sodium chloride flush, sodium chloride flush, sodium chloride flush, traMADol  Patient Profile   62 yo smoker was admitted with NSTEMI, acute systolic CHF, atrial fibrillation with RVR.  He is now s/p CABG.   Assessment/Plan   1. Acute systolic CHF: Ischemic cardiomyopathy, now s/p CABG.  Co-ox was good off milrinone.   - Volume status Remains elevated. - Give lasix 80 mg IV BID today and follow response.   - Continue Coreg 3.125 mg BID - Continue losartan 25 mg daily - Continue spironolactone 12.5 daily. No room to titrate meds with soft BPs - Plan to repeat echo 3 months, ?ICD.  Narrow QRS, not CRT candidate.  2. CAD: Strong FH premature CAD.  NSTEMI, now s/p CABG.  - Continue ASA 81, Plavix, and statin.  3. Atrial fibrillation: Paroxysmal.  - Only noted at initial admission in setting of severe CP/NSTEMI.  Will follow for recurrence.  - If recurs, he will need to be anticoagulated (holding off for now).  - CHA2DS2/VAS Stroke Risk Points would be at least 3 if recurs.  (CHF, Vascular disease, Pre-diabetes)  4. Smoking:  - Encouraged cessation.   Remains volume overloaded on exam. Increasing diuretics. No room to up-titrate meds. Follow pressures closely.   Length of Stay: [address]  Graciella FreerMichael Andrew Tillery, [place] JerseyPA-C  04/07/2017, 10:56 AM  Advanced Heart Failure Team Pager [phone] (M-F; 7a - 4p)  Please contact [facility] for night-coverage after hours (4p -7a ) and weekends on [email or web address]  Patient seen with PA, agree with the above note.  Symptomatically doing well.  On exam, remains volume overloaded with JVD.    Increase Lasix to 80 mg IV bid.  No BP room to titrate up meds today.    No recurrent atrial fibrillation.   Marca AnconaDalton Gwen Edler 04/07/2017 1:19 PM

## 2017-04-07 NOTE — Progress Notes (Signed)
CARDIAC REHAB PHASE I   PRE:  Rate/Rhythm: 76 SR    BP: sitting 92/55    SaO2: 95 RA  MODE:  Ambulation: 650 ft   POST:  Rate/Rhythm: 87 SR    BP: sitting 94/56     SaO2: 98 RA  Pt asleep. Able to wake and walk with RW. No c/o walking but SOB after sitting. VSS. Encouraged pt to try walking without RW next time. Wife present and we discussed HF, low sodium, daily wts, sx of HF, and smoking cessation. Pt receptive. Also discussed low carb diet for preDM. Encouraged them to read information. He really enjoyed fake cigarette. Pt and wife both trying to quit smoking. Will f/u for rest of education. [id number]  Ryan MassonRandi Kristan Carter Durham CES, ACSM 04/07/2017 2:19 PM

## 2017-04-08 LAB — CBC WITH DIFFERENTIAL/PLATELET
BASOS PCT: 1 %
Basophils Absolute: 0.1 10*3/uL (ref 0.0–0.1)
EOS ABS: 0.3 10*3/uL (ref 0.0–0.7)
EOS PCT: 3 %
HCT: 31.8 % — ABNORMAL LOW (ref 39.0–52.0)
Hemoglobin: 10.6 g/dL — ABNORMAL LOW (ref 13.0–17.0)
LYMPHS ABS: 2.7 10*3/uL (ref 0.7–4.0)
Lymphocytes Relative: 28 %
MCH: 33.2 pg (ref 26.0–34.0)
MCHC: 33.3 g/dL (ref 30.0–36.0)
MCV: 99.7 fL (ref 78.0–100.0)
MONO ABS: 0.9 10*3/uL (ref 0.1–1.0)
MONOS PCT: 10 %
NEUTROS PCT: 58 %
Neutro Abs: 5.6 10*3/uL (ref 1.7–7.7)
PLATELETS: 224 10*3/uL (ref 150–400)
RBC: 3.19 MIL/uL — ABNORMAL LOW (ref 4.22–5.81)
RDW: 14.6 % (ref 11.5–15.5)
WBC: 9.7 10*3/uL (ref 4.0–10.5)

## 2017-04-08 LAB — BASIC METABOLIC PANEL
Anion gap: 13 (ref 5–15)
BUN: 25 mg/dL — AB (ref 6–20)
CALCIUM: 8.5 mg/dL — AB (ref 8.9–10.3)
CO2: 23 mmol/L (ref 22–32)
CREATININE: 0.93 mg/dL (ref 0.61–1.24)
Chloride: 95 mmol/L — ABNORMAL LOW (ref 101–111)
GFR calc Af Amer: >60 mL/min (ref >60)
GFR calc non Af Amer: >60 mL/min (ref >60)
GLUCOSE: 144 mg/dL — AB (ref 65–99)
Potassium: 4.2 mmol/L (ref 3.5–5.1)
Sodium: 131 mmol/L — ABNORMAL LOW (ref 135–145)

## 2017-04-08 MED ORDER — NICOTINE 14 MG/24HR TD PT24
14.0000 mg | MEDICATED_PATCH | Freq: Every day | TRANSDERMAL | Status: DC
  Administered 2017-04-08 – 2017-04-09 (×2): 14 mg via TRANSDERMAL
  Filled 2017-04-08 (×2): qty 1

## 2017-04-08 NOTE — Progress Notes (Signed)
Patient ID: Ryan Durham, male   DOB: Jun 14, 1955, 62 y.o.   MRN: [id number]     Advanced Heart Failure Rounding Note HF Cardiology: Ryan LatchMcLean  Subjective:    Yesterday diuresed with IV lasix. Negative 2.9 liters. Weight down 4 pounds. Walked 650 feet.   Denies SOB. Pain controlled.   Objective:   Weight Range: 223 lb 4.8 oz (101.3 kg) Body mass index is 32.98 kg/m.   Vital Signs:   Temp:  [97.6 F (36.4 C)-98.5 F (36.9 C)] 97.6 F (36.4 C) (01/16 0341) Pulse Rate:  [80-85] 80 (01/16 0341) Resp:  [17-28] 25 (01/16 0347) BP: (87-105)/(53-61) 94/56 (01/16 0341) SpO2:  [91 %-100 %] 92 % (01/16 0341) Weight:  [223 lb 4.8 oz (101.3 kg)] 223 lb 4.8 oz (101.3 kg) (01/16 0347) Last BM Date: 04/06/17  Weight change: Filed Weights   04/06/17 0505 04/07/17 0428 04/08/17 0347  Weight: 227 lb 3.2 oz (103.1 kg) 227 lb 3.2 oz (103.1 kg) 223 lb 4.8 oz (101.3 kg)    Intake/Output:   Intake/Output Summary (Last 24 hours) at 04/08/2017 0801 Last data filed at 04/08/2017 0343 Gross per 24 hour  Intake 120 ml  Output 3175 ml  Net -3055 ml     Physical Exam    General:  Well appearing. No resp difficulty. In bed.  HEENT: normal Neck: supple. no JVD. Carotids 2+ bilat; no bruits. No lymphadenopathy or thryomegaly appreciated. Cor: PMI nondisplaced. Regular rate & rhythm. No rubs, gallops or murmurs. Sternal incision approximated.  Lungs: Decreased in the bases on room air.  Abdomen: soft, nontender, nondistended. No hepatosplenomegaly. No bruits or masses. Good bowel sounds. Extremities: no cyanosis, clubbing, rash, R and LLE 1+ edema. Ted hose on bilaterally. R forearm ecchymotic. RUE PICC Neuro: alert & orientedx3, cranial nerves grossly intact. moves all 4 extremities w/o difficulty. Affect pleasant   Telemetry   NSR 80-90s personally reviewed.   Labs    CBC Recent Labs    04/07/17 0423 04/08/17 0432  WBC 10.4 9.7  NEUTROABS 6.3 5.6  HGB 10.4* 10.6*  HCT 31.4* 31.8*    MCV 99.4 99.7  PLT 210 224   Basic Metabolic Panel Recent Labs    40/98/1101/15/19 0423 04/08/17 0432  NA 132* 131*  K 4.0 4.2  CL 96* 95*  CO2 24 23  GLUCOSE 123* 144*  BUN 30* 25*  CREATININE 0.96 0.93  CALCIUM 8.2* 8.5*   Liver Function Tests No results for input(s): AST, ALT, ALKPHOS, BILITOT, PROT, ALBUMIN in the last 72 hours. No results for input(s): LIPASE, AMYLASE in the last 72 hours. Cardiac Enzymes No results for input(s): CKTOTAL, CKMB, CKMBINDEX, TROPONINI in the last 72 hours.  BNP: BNP (last 3 results) Recent Labs    03/30/17 1525  BNP 617.3*    ProBNP (last 3 results) No results for input(s): PROBNP in the last 8760 hours.   D-Dimer No results for input(s): DDIMER in the last 72 hours. Hemoglobin A1C No results for input(s): HGBA1C in the last 72 hours. Fasting Lipid Panel No results for input(s): CHOL, HDL, LDLCALC, TRIG, CHOLHDL, LDLDIRECT in the last 72 hours. Thyroid Function Tests No results for input(s): TSH, T4TOTAL, T3FREE, THYROIDAB in the last 72 hours.  Invalid input(s): FREET3  Other results:   Imaging    No results found.   Medications:     Scheduled Medications: . aspirin EC  81 mg Oral Daily  . atorvastatin  80 mg Oral q1800  . bisacodyl  10 mg Oral  Daily   Or  . bisacodyl  10 mg Rectal Daily  . carvedilol  3.125 mg Oral BID WC  . Chlorhexidine Gluconate Cloth  6 each Topical Daily  . Chlorhexidine Gluconate Cloth  6 each Topical Daily  . clopidogrel  75 mg Oral Daily  . docusate sodium  200 mg Oral Daily  . furosemide  80 mg Intravenous BID  . losartan  25 mg Oral Daily  . mouth rinse  15 mL Mouth Rinse BID  . moving right along book   Does not apply Once  . pantoprazole  40 mg Oral Daily  . potassium chloride  20 mEq Oral BID  . sodium chloride flush  10-40 mL Intracatheter Q12H  . sodium chloride flush  10-40 mL Intracatheter Q12H  . sodium chloride flush  3 mL Intravenous Q12H  . spironolactone  12.5 mg Oral  Daily    Infusions: . sodium chloride      PRN Medications: diphenhydrAMINE, levalbuterol, metoprolol tartrate, morphine injection, ondansetron (ZOFRAN) IV, oxyCODONE, sodium chloride flush, sodium chloride flush, sodium chloride flush, traMADol  Patient Profile   62 yo smoker was admitted with NSTEMI, acute systolic CHF, atrial fibrillation with RVR.  He is now s/p CABG.   Assessment/Plan   1. Acute systolic CHF: ECHO 03/30/2017 EF 25-30%. Ischemic cardiomyopathy, now s/p CABG.  Volume status improving. Getting IV lasix 80 mg twice a day. Renal function stable.  - Volume status remains elevated.  Continue same IV Lasix today, possibly to po tomorrow.  - Continue Coreg 3.125 mg BID - Continue losartan 25 mg daily - Continue spironolactone 12.5 daily.  - Plan to repeat echo 3 months, ?ICD.  Narrow QRS, not CRT candidate.  2. CAD: Strong FH premature CAD.  NSTEMI, now s/p CABG.  - Continue ASA 81, Plavix, and statin.  3. Atrial fibrillation: Paroxysmal.  - Only noted at initial admission in setting of severe CP/NSTEMI.   - Maintaining NSR.   - If recurs, he will need to be anticoagulated (holding off for now).  - CHA2DS2/VAS Stroke Risk Points would be at least 3 if recurs.  (CHF, Vascular disease, Pre-diabetes)  4. Smoking:  - Encouraged cessation.   Needs another day of IV diuresis.   Length of Stay: 9  Ryan Clegg, NP  04/08/2017, 8:01 AM  Advanced Heart Failure Team Pager [phone] (M-F; 7a - 4p)  Please contact [facility] for night-coverage after hours (4p -7a ) and weekends on [email or web address]  Patient seen with NP, agree with the above note.  Feeling good, walking in halls. Still with volume overload on exam, peripheral edema and JVP around 10.    Would give Laisx 80 mg IV bid today, possibly to po tomorrow.  No BP room to titrate meds.   Ryan Durham 04/08/2017 11:58 AM

## 2017-04-08 NOTE — Progress Notes (Signed)
CARDIAC REHAB PHASE I   PRE:  Rate/Rhythm: 81 SR  BP:  Supine:   Sitting: 102/68  Standing:    SaO2: 95 RA  MODE:  Ambulation: 790 ft   POST:  Rate/Rhythm: 81 SR  BP:  Supine:   Sitting:102/66   Standing:    SaO2: 99 RA 1150-1215 Pt tolerated ambulation well without c/o. I pushed IV pole. Gait steady VS stable.Pt back to side of bed after walk. Reviewed CHF education with pt and wife and reinforced daily weights and low sodium diet.Discussed Outpt. CRP with them, will send referral to [place].  Melina CopaLisa Ajit Errico RN 04/08/2017 12:22 PM

## 2017-04-08 NOTE — Progress Notes (Addendum)
      [address]       [place] [id number]             [phone]      8 Days Post-Op Procedure(s) (LRB): CORONARY ARTERY BYPASS GRAFTING (CABG) time 5 using bilateral saphaneous vien, harvested endoscopicly and right internal mammary artery. (N/A) CLIPPING OF ATRIAL APPENDAGE (Left) TRANSESOPHAGEAL ECHOCARDIOGRAM (TEE) (N/A)   Subjective:  No new complaints.  Feels good, hoping to go home soon.  Objective: Vital signs in last 24 hours: Temp:  [97.6 F (36.4 C)-98.6 F (37 C)] 98.6 F (37 C) (01/16 0800) Pulse Rate:  [80-85] 82 (01/16 0800) Cardiac Rhythm: Normal sinus rhythm (01/16 0803) Resp:  [17-28] 20 (01/16 0800) BP: (87-105)/(53-61) 97/59 (01/16 0800) SpO2:  [91 %-100 %] 99 % (01/16 0800) Weight:  [223 lb 4.8 oz (101.3 kg)] 223 lb 4.8 oz (101.3 kg) (01/16 0347)  Intake/Output from previous day: 01/15 0701 - 01/16 0700 In: 195 [P.O.:120; I.V.:75] Out: 3175 [Urine:3175] Intake/Output this shift: Total I/O In: 240 [P.O.:240] Out: -   General appearance: alert, cooperative and no distress Heart: regular rate and rhythm Lungs: clear to auscultation bilaterally Abdomen: soft, non-tender; bowel sounds normal; no masses,  no organomegaly Extremities: edema trace, improving Wound: clean and dry  Lab Results: Recent Labs    04/07/17 0423 04/08/17 0432  WBC 10.4 9.7  HGB 10.4* 10.6*  HCT 31.4* 31.8*  PLT 210 224   BMET:  Recent Labs    04/07/17 0423 04/08/17 0432  NA 132* 131*  K 4.0 4.2  CL 96* 95*  CO2 24 23  GLUCOSE 123* 144*  BUN 30* 25*  CREATININE 0.96 0.93  CALCIUM 8.2* 8.5*    PT/INR: No results for input(s): LABPROT, INR in the last 72 hours. ABG    Component Value Date/Time   PHART 7.402 04/01/2017 1051   HCO3 23.4 04/01/2017 1051   TCO2 21 (L) 04/01/2017 1624   ACIDBASEDEF 1.0 04/01/2017 1051   O2SAT 62.4 04/05/2017 1130   CBG (last 3)  Recent Labs    04/05/17 1111 04/05/17 1635  GLUCAP 130* 151*     Assessment/Plan: S/P Procedure(s) (LRB): CORONARY ARTERY BYPASS GRAFTING (CABG) time 5 using bilateral saphaneous vien, harvested endoscopicly and right internal mammary artery. (N/A) CLIPPING OF ATRIAL APPENDAGE (Left) TRANSESOPHAGEAL ECHOCARDIOGRAM (TEE) (N/A)  1. CV- NSR, on Coreg, Cozaar per AHF 2. Pulm- off oxygen, no acute issues, continue IS 3. Renal- creatinine WNL, K is okay, weight is trending down, edema improved, on IV Lasix and Aldactone per AHF 4. Dispo- patient stable, looks great, will d/c once okay with AHF team   LOS: 9 days    Erin Barrett 04/08/2017  I have seen and examined the patient and agree with the assessment and plan as outlined.  Purcell Nailslarence H Owen, MD 04/08/2017 3:11 PM

## 2017-04-09 LAB — CBC WITH DIFFERENTIAL/PLATELET
Basophils Absolute: 0.1 10*3/uL (ref 0.0–0.1)
Basophils Relative: 1 %
EOS ABS: 0.3 10*3/uL (ref 0.0–0.7)
Eosinophils Relative: 3 %
HEMATOCRIT: 33.6 % — AB (ref 39.0–52.0)
HEMOGLOBIN: 11 g/dL — AB (ref 13.0–17.0)
LYMPHS ABS: 2.8 10*3/uL (ref 0.7–4.0)
Lymphocytes Relative: 27 %
MCH: 32.6 pg (ref 26.0–34.0)
MCHC: 32.7 g/dL (ref 30.0–36.0)
MCV: 99.7 fL (ref 78.0–100.0)
MONO ABS: 0.9 10*3/uL (ref 0.1–1.0)
MONOS PCT: 8 %
NEUTROS PCT: 61 %
Neutro Abs: 6.2 10*3/uL (ref 1.7–7.7)
Platelets: 239 10*3/uL (ref 150–400)
RBC: 3.37 MIL/uL — ABNORMAL LOW (ref 4.22–5.81)
RDW: 14.5 % (ref 11.5–15.5)
WBC: 10.2 10*3/uL (ref 4.0–10.5)

## 2017-04-09 LAB — BASIC METABOLIC PANEL
Anion gap: 11 (ref 5–15)
BUN: 22 mg/dL — ABNORMAL HIGH (ref 6–20)
CHLORIDE: 98 mmol/L — AB (ref 101–111)
CO2: 25 mmol/L (ref 22–32)
Calcium: 8.5 mg/dL — ABNORMAL LOW (ref 8.9–10.3)
Creatinine, Ser: 0.93 mg/dL (ref 0.61–1.24)
GFR calc non Af Amer: >60 mL/min (ref >60)
GLUCOSE: 133 mg/dL — AB (ref 65–99)
Potassium: 4.1 mmol/L (ref 3.5–5.1)
Sodium: 134 mmol/L — ABNORMAL LOW (ref 135–145)

## 2017-04-09 MED ORDER — NICOTINE 14 MG/24HR TD PT24: 14.0000 mg | MEDICATED_PATCH | Freq: Every day | TRANSDERMAL | 0 refills | Status: DC

## 2017-04-09 MED ORDER — ASPIRIN 81 MG PO TBEC: 81.0000 mg | DELAYED_RELEASE_TABLET | Freq: Every day | ORAL | Status: AC

## 2017-04-09 MED ORDER — CARVEDILOL 3.125 MG PO TABS: 3.1250 mg | ORAL_TABLET | Freq: Two times a day (BID) | ORAL | 1 refills | Status: DC

## 2017-04-09 MED ORDER — LOSARTAN POTASSIUM 25 MG PO TABS: 25.0000 mg | ORAL_TABLET | Freq: Every day | ORAL | 1 refills | Status: DC

## 2017-04-09 MED ORDER — CLOPIDOGREL BISULFATE 75 MG PO TABS: 75.0000 mg | ORAL_TABLET | Freq: Every day | ORAL | 1 refills | Status: DC

## 2017-04-09 MED ORDER — OXYCODONE HCL 5 MG PO TABS: 5.0000 mg | ORAL_TABLET | ORAL | 0 refills | Status: DC | PRN

## 2017-04-09 MED ORDER — POTASSIUM CHLORIDE CRYS ER 20 MEQ PO TBCR: 20.0000 meq | EXTENDED_RELEASE_TABLET | Freq: Two times a day (BID) | ORAL | 1 refills | Status: DC

## 2017-04-09 MED ORDER — SPIRONOLACTONE 25 MG PO TABS: 12.5000 mg | ORAL_TABLET | Freq: Every day | ORAL | 1 refills | Status: DC

## 2017-04-09 MED ORDER — FUROSEMIDE 40 MG PO TABS: 40.0000 mg | ORAL_TABLET | Freq: Two times a day (BID) | ORAL | 1 refills | Status: DC

## 2017-04-09 MED ORDER — ATORVASTATIN CALCIUM 80 MG PO TABS: 80.0000 mg | ORAL_TABLET | Freq: Every day | ORAL | 1 refills | Status: DC

## 2017-04-09 MED ORDER — FUROSEMIDE 40 MG PO TABS
40.0000 mg | ORAL_TABLET | Freq: Two times a day (BID) | ORAL | Status: DC
  Administered 2017-04-09: 40 mg via ORAL
  Filled 2017-04-09: qty 1

## 2017-04-09 NOTE — Progress Notes (Signed)
Patient ID: Ryan Durham, male   DOB: Oct 04, 1955, 62 y.o.   MRN: [id number]     Advanced Heart Failure Rounding Note HF Cardiology: Shirlee Latch  Subjective:   Yesterday diuresed with IV lasix. Negative>2.4 liters. Weight down another 6 pounds.   Denies SOB. Wants to go home.    Objective:   Weight Range: 217 lb 11.2 oz (98.7 kg) Body mass index is 32.15 kg/m.   Vital Signs:   Temp:  [98.3 F (36.8 C)-98.7 F (37.1 C)] 98.3 F (36.8 C) (01/17 0331) Pulse Rate:  [74-84] 77 (01/17 0331) Resp:  [17-23] 17 (01/17 0331) BP: (92-136)/(56-87) 94/56 (01/17 0331) SpO2:  [99 %] 99 % (01/17 0331) Weight:  [217 lb 11.2 oz (98.7 kg)] 217 lb 11.2 oz (98.7 kg) (01/17 0334) Last BM Date: 04/08/17  Weight change: Filed Weights   04/07/17 0428 04/08/17 0347 04/09/17 0334  Weight: 227 lb 3.2 oz (103.1 kg) 223 lb 4.8 oz (101.3 kg) 217 lb 11.2 oz (98.7 kg)    Intake/Output:   Intake/Output Summary (Last 24 hours) at 04/09/2017 0752 Last data filed at 04/09/2017 0330 Gross per 24 hour  Intake 480 ml  Output 2900 ml  Net -2420 ml     Physical Exam    General:   No resp difficulty. Sitting on the side of the bed.  HEENT: normal Neck: supple. JVD 7-8 Carotids 2+ bilat; no bruits. No lymphadenopathy or thryomegaly appreciated. Cor: PMI nondisplaced. Regular rate & rhythm. No rubs, gallops or murmurs. Lungs: clear Abdomen: soft, nontender, nondistended. No hepatosplenomegaly. No bruits or masses. Good bowel sounds. Extremities: no cyanosis, clubbing, rash, R and LLE trace edema with ted hose. RUE PICC  Neuro: alert & orientedx3, cranial nerves grossly intact. moves all 4 extremities w/o difficulty. Affect pleasant   Telemetry   NSR 70-80s personally reviewed.    Labs    CBC Recent Labs    04/08/17 0432 04/09/17 0353  WBC 9.7 10.2  NEUTROABS 5.6 6.2  HGB 10.6* 11.0*  HCT 31.8* 33.6*  MCV 99.7 99.7  PLT 224 239   Basic Metabolic Panel Recent Labs    13/08/65 0432  04/09/17 0353  NA 131* 134*  K 4.2 4.1  CL 95* 98*  CO2 23 25  GLUCOSE 144* 133*  BUN 25* 22*  CREATININE 0.93 0.93  CALCIUM 8.5* 8.5*   Liver Function Tests No results for input(s): AST, ALT, ALKPHOS, BILITOT, PROT, ALBUMIN in the last 72 hours. No results for input(s): LIPASE, AMYLASE in the last 72 hours. Cardiac Enzymes No results for input(s): CKTOTAL, CKMB, CKMBINDEX, TROPONINI in the last 72 hours.  BNP: BNP (last 3 results) Recent Labs    03/30/17 1525  BNP 617.3*    ProBNP (last 3 results) No results for input(s): PROBNP in the last 8760 hours.   D-Dimer No results for input(s): DDIMER in the last 72 hours. Hemoglobin A1C No results for input(s): HGBA1C in the last 72 hours. Fasting Lipid Panel No results for input(s): CHOL, HDL, LDLCALC, TRIG, CHOLHDL, LDLDIRECT in the last 72 hours. Thyroid Function Tests No results for input(s): TSH, T4TOTAL, T3FREE, THYROIDAB in the last 72 hours.  Invalid input(s): FREET3  Other results:   Imaging    No results found.   Medications:     Scheduled Medications: . aspirin EC  81 mg Oral Daily  . atorvastatin  80 mg Oral q1800  . bisacodyl  10 mg Oral Daily   Or  . bisacodyl  10 mg Rectal Daily  .  carvedilol  3.125 mg Oral BID WC  . Chlorhexidine Gluconate Cloth  6 each Topical Daily  . Chlorhexidine Gluconate Cloth  6 each Topical Daily  . clopidogrel  75 mg Oral Daily  . docusate sodium  200 mg Oral Daily  . furosemide  80 mg Intravenous BID  . losartan  25 mg Oral Daily  . mouth rinse  15 mL Mouth Rinse BID  . moving right along book   Does not apply Once  . nicotine  14 mg Transdermal Daily  . pantoprazole  40 mg Oral Daily  . potassium chloride  20 mEq Oral BID  . sodium chloride flush  10-40 mL Intracatheter Q12H  . sodium chloride flush  10-40 mL Intracatheter Q12H  . sodium chloride flush  3 mL Intravenous Q12H  . spironolactone  12.5 mg Oral Daily    Infusions: . sodium chloride       PRN Medications: diphenhydrAMINE, levalbuterol, metoprolol tartrate, morphine injection, ondansetron (ZOFRAN) IV, oxyCODONE, sodium chloride flush, sodium chloride flush, sodium chloride flush, traMADol  Patient Profile   62 yo smoker was admitted with NSTEMI, acute systolic CHF, atrial fibrillation with RVR.  He is now s/p CABG.   Assessment/Plan   1. Acute systolic CHF: ECHO 03/30/2017 EF 25-30%. Ischemic cardiomyopathy, now s/p CABG.  Volume status improved. Stop IV lasix. Switch to lasix 40 mg po twice a day.  - Continue losartan 25 mg daily - Continue spironolactone 12.5 daily.  - Plan to repeat echo 3 months, ?ICD.  Narrow QRS, not CRT candidate.  No room to up titrate meds.  2. CAD: Strong FH premature CAD.  NSTEMI, now s/p CABG.  - Continue ASA 81, Plavix, and statin.  3. Atrial fibrillation: Paroxysmal.  - Only noted at initial admission in setting of severe CP/NSTEMI.   - Maintaining NSR.  - If recurs, he will need to be anticoagulated (holding off for now).  - CHA2DS2/VAS Stroke Risk Points would be at least 3 if recurs.  (CHF, Vascular disease, Pre-diabetes)  4. Smoking:  - Encouraged cessation.   Instructed to weigh and record daily.    Home today  Lasix 40 mg po twice a day Losartan 25 mg daily Spironolactone 12. 5 mg daily Coreg 3.125 mg twice a day.  Atorvastatin 80 daily Plavix 75 ASA 81  I will set up HF follow up.   Length of Stay: 10  Tonye BecketAmy Clegg, NP  04/09/2017, 7:52 AM  Advanced Heart Failure Team Pager [phone] (M-F; 7a - 4p)  Please contact [facility] for night-coverage after hours (4p -7a ) and weekends on [email or web address]  Patient seen with NP, agree with the above note.  He has diuresed well, feels better. JVP 8 cm. Transition to po Lasix and will send home on the above meds.   Marca AnconaDalton Dynesha Woolen 04/09/2017 8:59 AM

## 2017-04-09 NOTE — Progress Notes (Signed)
Finished up ed with pt and wife. Good reception. He is now wearing 14 mg patch and wants to continue. We discussed this further. Asked appropriate ex and diet questions as well.  Referring to [facility] CRP[facility]. [id number] Ethelda ChickKristan Vicki Chaffin CES, ACSM 10:59 AM 04/09/2017

## 2017-04-09 NOTE — Care Management Note (Signed)
Case Management Note Donn PieriniKristi Geovany Trudo RN, BSN Unit 4E-Case Manager-- 2H coverage [phone]  Patient Details  Name: Ryan Durham MRN: [id number] Date of Birth: 1956/01/28  Subjective/Objective:  Pt admitted with NSTEMI, s/p cath with 3VD found- CVTS consulted for possible CABG- ?OR in the AM 03/31/17                  Action/Plan: PTA pt lived at home with spouse- who will be available to assist when pt returns home- CM to follow for transition needs   Expected Discharge Date:  04/09/17               Expected Discharge Plan:  Home/Self Care  In-House Referral:  NA  Discharge planning Services  CM Consult  Post Acute Care Choice:  NA Choice offered to:  NA  DME Arranged:    DME Agency:     HH Arranged:    HH Agency:     Status of Service:  Completed, signed off  If discussed at [employer] Length of Stay Meetings, dates discussed:    Discharge Disposition: home/self care   Additional Comments:  04/09/17- 1045- Ryan Dolloff RN, CM- pt for d/c home today- no CM needs noted for transition to home.   Darrold SpanWebster, Ryan Giacobbe Hall, RN 04/09/2017, 10:54 AM

## 2017-04-09 NOTE — Progress Notes (Addendum)
      [address]       [place] [id number]             [phone]      9 Days Post-Op Procedure(s) (LRB): CORONARY ARTERY BYPASS GRAFTING (CABG) time 5 using bilateral saphaneous vien, harvested endoscopicly and right internal mammary artery. (N/A) CLIPPING OF ATRIAL APPENDAGE (Left) TRANSESOPHAGEAL ECHOCARDIOGRAM (TEE) (N/A)   Subjective:  No new complaints.  Patient feels great, hoping to go home today.  + ambulation  + BM  Objective: Vital signs in last 24 hours: Temp:  [98.3 F (36.8 C)-98.7 F (37.1 C)] 98.3 F (36.8 C) (01/17 0331) Pulse Rate:  [74-84] 77 (01/17 0331) Cardiac Rhythm: Normal sinus rhythm (01/17 0730) Resp:  [17-23] 17 (01/17 0331) BP: (92-136)/(56-87) 94/56 (01/17 0331) SpO2:  [99 %] 99 % (01/17 0331) Weight:  [217 lb 11.2 oz (98.7 kg)] 217 lb 11.2 oz (98.7 kg) (01/17 0334)  Intake/Output from previous day: 01/16 0701 - 01/17 0700 In: 480 [P.O.:480] Out: 2900 [Urine:2900]  General appearance: alert, cooperative and no distress Heart: regular rate and rhythm Lungs: diminished breath sounds bibasilar Abdomen: soft, non-tender; bowel sounds normal; no masses,  no organomegaly Extremities: edema trace Wound: clean and dry  Lab Results: Recent Labs    04/08/17 0432 04/09/17 0353  WBC 9.7 10.2  HGB 10.6* 11.0*  HCT 31.8* 33.6*  PLT 224 239   BMET:  Recent Labs    04/08/17 0432 04/09/17 0353  NA 131* 134*  K 4.2 4.1  CL 95* 98*  CO2 23 25  GLUCOSE 144* 133*  BUN 25* 22*  CREATININE 0.93 0.93  CALCIUM 8.5* 8.5*    PT/INR: No results for input(s): LABPROT, INR in the last 72 hours. ABG    Component Value Date/Time   PHART 7.402 04/01/2017 1051   HCO3 23.4 04/01/2017 1051   TCO2 21 (L) 04/01/2017 1624   ACIDBASEDEF 1.0 04/01/2017 1051   O2SAT 62.4 04/05/2017 1130   CBG (last 3)  No results for input(s): GLUCAP in the last 72 hours.  Assessment/Plan: S/P Procedure(s) (LRB): CORONARY ARTERY BYPASS GRAFTING  (CABG) time 5 using bilateral saphaneous vien, harvested endoscopicly and right internal mammary artery. (N/A) CLIPPING OF ATRIAL APPENDAGE (Left) TRANSESOPHAGEAL ECHOCARDIOGRAM (TEE) (N/A)  1. CV- NSR, BP low at times in the 90s, patient tolerates without issues- continue Coreg, Cozaar 2. Pulm- no acute issues, continue IS 3. Renal- creatinine stable, good diuresis with IV Lasix, AHF following.. Transitioned to oral Lasix today, continue Aldactone 4. Dispo- patient stable, likely home today if okay with AHF  LOS: 10 days    Lowella Dandyrin Barrett 04/09/2017  I have seen and examined the patient and agree with the assessment and plan as outlined.  Purcell Nailslarence H Owen, MD 04/09/2017 9:42 AM

## 2017-04-16 ENCOUNTER — Ambulatory Visit (HOSPITAL_COMMUNITY)
Admit: 2017-04-16 | Discharge: 2017-04-16 | Disposition: A | Discharge status: Home / Self Care | Payer: 59 | Attending: Internal Medicine | Admitting: Internal Medicine

## 2017-04-16 ENCOUNTER — Encounter (HOSPITAL_COMMUNITY): Payer: Self-pay

## 2017-04-16 VITALS — BP 94/58 | HR 92 | Wt 210.8 lb

## 2017-04-16 DIAGNOSIS — E785 Hyperlipidemia, unspecified: Secondary | ICD-10-CM | POA: Insufficient documentation

## 2017-04-16 DIAGNOSIS — I5022 Chronic systolic (congestive) heart failure: Secondary | ICD-10-CM | POA: Diagnosis not present

## 2017-04-16 DIAGNOSIS — E119 Type 2 diabetes mellitus without complications: Secondary | ICD-10-CM | POA: Diagnosis not present

## 2017-04-16 DIAGNOSIS — Z87891 Personal history of nicotine dependence: Secondary | ICD-10-CM | POA: Diagnosis not present

## 2017-04-16 DIAGNOSIS — I252 Old myocardial infarction: Secondary | ICD-10-CM | POA: Diagnosis not present

## 2017-04-16 DIAGNOSIS — E118 Type 2 diabetes mellitus with unspecified complications: Secondary | ICD-10-CM | POA: Diagnosis not present

## 2017-04-16 DIAGNOSIS — I480 Paroxysmal atrial fibrillation: Secondary | ICD-10-CM | POA: Diagnosis not present

## 2017-04-16 DIAGNOSIS — Z7982 Long term (current) use of aspirin: Secondary | ICD-10-CM | POA: Insufficient documentation

## 2017-04-16 DIAGNOSIS — Z720 Tobacco use: Secondary | ICD-10-CM | POA: Diagnosis not present

## 2017-04-16 DIAGNOSIS — Z79891 Long term (current) use of opiate analgesic: Secondary | ICD-10-CM | POA: Insufficient documentation

## 2017-04-16 DIAGNOSIS — I25110 Atherosclerotic heart disease of native coronary artery with unstable angina pectoris: Secondary | ICD-10-CM | POA: Diagnosis not present

## 2017-04-16 DIAGNOSIS — Z79899 Other long term (current) drug therapy: Secondary | ICD-10-CM | POA: Insufficient documentation

## 2017-04-16 DIAGNOSIS — I255 Ischemic cardiomyopathy: Secondary | ICD-10-CM

## 2017-04-16 DIAGNOSIS — Z7902 Long term (current) use of antithrombotics/antiplatelets: Secondary | ICD-10-CM | POA: Insufficient documentation

## 2017-04-16 DIAGNOSIS — Z8249 Family history of ischemic heart disease and other diseases of the circulatory system: Secondary | ICD-10-CM | POA: Insufficient documentation

## 2017-04-16 DIAGNOSIS — Z951 Presence of aortocoronary bypass graft: Secondary | ICD-10-CM

## 2017-04-16 LAB — BASIC METABOLIC PANEL
ANION GAP: 13 (ref 5–15)
BUN: 23 mg/dL — ABNORMAL HIGH (ref 6–20)
CHLORIDE: 99 mmol/L — AB (ref 101–111)
CO2: 23 mmol/L (ref 22–32)
Calcium: 9.2 mg/dL (ref 8.9–10.3)
Creatinine, Ser: 1.03 mg/dL (ref 0.61–1.24)
GFR calc Af Amer: >60 mL/min (ref >60)
GFR calc non Af Amer: >60 mL/min (ref >60)
Glucose, Bld: 138 mg/dL — ABNORMAL HIGH (ref 65–99)
POTASSIUM: 4.3 mmol/L (ref 3.5–5.1)
SODIUM: 135 mmol/L (ref 135–145)

## 2017-04-16 NOTE — Patient Instructions (Signed)
Labs drawn today (if we do not call you, then your lab work was stable)   Your physician recommends that you schedule a follow-up appointment in: 3 weeks with Fara ChuteErika Pharm D   Your physician recommends that you schedule a follow-up appointment in: 2 months with Dr. Shirlee LatchMcLean

## 2017-04-16 NOTE — Progress Notes (Signed)
Advanced Heart Failure Clinic Note    Primary Care: Primary Cardiologist: Dr. Shirlee LatchMcLean   HPI:  Ryan Durham is a 62 y.o. 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   Admitted 1/6 -> 04/09/17 with NSTEMI with sudden onset of chest tightness and SOB. Noted to be in rapid Afib on arrival. He spontaneously converted to NSR with treatment of his chest pain. Underwent LHC that showed critical 3vD. IABP placed with cardiogenic shock and TCTS consulted for CABG. Pt underwent CABG 03/31/2017 with no immediate complications. Pressors weaned and extubated as tolerated. Weaned off IABP POD #1. Pt diuresed with IV lasix and HF medications adjusted as tolerated.  Discharge weight 217 lbs  He presents today for post hospital follow up. Doing well overall. No major complaints since he has been home. He is not smoking, but using nicotine patch. He is walking up to 3/4 of a mile without difficulty.  He has intermittent DOE but cannot give a specific example.  Occasionally has peripheral edema but overall stable. Denies lightheadedness or dizziness. Denies CP. He has been having trouble with ED since his hospital stay.   Past Medical History 1. Chronic systolic CHF:  - ECHO 03/30/2017 EF 25-30%. Ischemic cardiomyopathy, now s/p CABG.  - Plan to repeat echo 3 months, ?ICD. Narrow QRS, not CRT candidate.  2. ZOX:WRUEAVCAD:Strong FH premature CAD. - NSTEMI, now s/p CABG 2019 - Continue ASA 81, Plavix, and statin.  3. Atrial fibrillation: Paroxysmal.  - Only noted at initial admission 03/2017 in setting of severe CP/NSTEMI.  - Maintaining NSR.  - CHA2DS2/VAS Stroke Risk Points would be at least 3 if recurs.  (CHF, Vascular disease, Pre-diabetes)  4. Smoking:  - Have encouraged compete cessation.   Past Medical History:  Diagnosis Date  . Acute on chronic systolic heart failure (HCC) 03/29/2017  . Anginal pain (HCC)    this admission  . Cardiomyopathy,  ischemic 03/30/2017  . Coronary artery disease    this admission  . Coronary artery disease involving native coronary artery of native heart with unstable angina pectoris (HCC) 03/29/2017  . Dyspnea    this admission  . Dysrhythmia    afib with this admission/runs of VTACH/Afib both with this admission  . Hyperlipidemia   . Myocardial infarction [facility](HCC)    this admission  . Paroxysmal atrial fibrillation (HCC) 03/29/2017  . Tobacco abuse   . Type II diabetes mellitus (HCC) 03/30/2017    Current Outpatient Medications  Medication Sig Dispense Refill  . aspirin EC 81 MG EC tablet Take 1 tablet (81 mg total) by mouth daily.    [name]. atorvastatin (LIPITOR) 80 MG tablet Take 1 tablet (80 mg total) by mouth daily at 6 PM. 30 tablet 1  . carvedilol (COREG) 3.125 MG tablet Take 1 tablet (3.125 mg total) by mouth 2 (two) times daily with a meal. 60 tablet 1  . clopidogrel (PLAVIX) 75 MG tablet Take 1 tablet (75 mg total) by mouth daily. 30 tablet 1  . furosemide (LASIX) 40 MG tablet Take 1 tablet (40 mg total) by mouth 2 (two) times daily. 60 tablet 1  . losartan (COZAAR) 25 MG tablet Take 1 tablet (25 mg total) by mouth daily. 30 tablet 1  . nicotine (NICODERM CQ - DOSED IN MG/24 HOURS) 14 mg/24hr patch Place 1 patch (14 mg total) onto the skin daily. 28 patch 0  . potassium chloride SA (K-DUR,KLOR-CON) 20 MEQ tablet Take  1 tablet (20 mEq total) by mouth 2 (two) times daily. 60 tablet 1  . spironolactone (ALDACTONE) 25 MG tablet Take 0.5 tablets (12.5 mg total) by mouth daily. 30 tablet 1  . oxyCODONE (OXY IR/ROXICODONE) 5 MG immediate release tablet Take 1 tablet (5 mg total) by mouth every 4 (four) hours as needed for severe pain. (Patient not taking: Reported on 04/16/2017) 30 tablet 0   No current facility-administered medications for this encounter.    No Known Allergies  Social History   Socioeconomic History  . Marital status: Married    Spouse name: Not on file  . Number of children: Not on  file  . Years of education: Not on file  . Highest education level: Not on file  Social Needs  . Financial resource strain: Not on file  . Food insecurity - worry: Not on file  . Food insecurity - inability: Not on file  . Transportation needs - medical: Not on file  . Transportation needs - non-medical: Not on file  Occupational History  . Not on file  Tobacco Use  . Smoking status: Former Smoker    Packs/day: 1.00    Years: 30.00    Pack years: 30.00    Types: Cigarettes  . Smokeless tobacco: Never Used  Substance and Sexual Activity  . Alcohol use: No    Frequency: Never  . Drug use: No  . Sexual activity: Not on file  Other Topics Concern  . Not on file  Social History Narrative   Works in Naval architect for [employer]   Family History  Problem Relation Age of Onset  . Cirrhosis Mother   . Heart attack Sister   . Myocarditis Brother    Vitals:   04/16/17 1030  BP: (!) 94/58  Pulse: 92  SpO2: 95%  Weight: 210 lb 12.8 oz (95.6 kg)   Wt Readings from Last 3 Encounters:  04/16/17 210 lb 12.8 oz (95.6 kg)  04/09/17 217 lb 11.2 oz (98.7 kg)    PHYSICAL EXAM: General:  Well appearing. No respiratory difficulty HEENT: normal Neck: supple. JVP 6-7 cm. Carotids 2+ bilat; no bruits. No lymphadenopathy or thyromegaly appreciated. Cor: PMI nondisplaced. Regular rate & rhythm. No rubs, gallops or murmurs. Lungs: clear Abdomen: soft, nontender, nondistended. No hepatosplenomegaly. No bruits or masses. Good bowel sounds. Extremities: no cyanosis, clubbing, rash, edema Neuro: alert & oriented x 3, cranial nerves grossly intact. moves all 4 extremities w/o difficulty. Affect pleasant.  ASSESSMENT & PLAN:  1. Chronic systolic CHF: ECHO 03/30/2017 EF 25-30%. ICM, now s/p CABG as below - NYHA II-III - Volume status stable on exam.  - Continue lasix 40 mg BID - Continue coreg 3.125 mg BID.  - Continue losartan 25 mg daily. No room to uptitrate today.  - Continue  spironolactone 12.5 daily.  - Plan to repeat echo 3 months, ?ICD. Narrow QRS, not CRT candidate.  2. ZOX:WRUEAV FH premature CAD.NSTEMI, now s/p CABG x 5 03/31/2017 - Continue ASA 81, Plavix, and statin.  3. Atrial fibrillation: Paroxysmal.  - Only noted at initial admission in setting of severe CP/NSTEMI.  - Maintaining NSR. - If recurs, he will need to be anticoagulated (holding off for now). - CHA2DS2/VAS Stroke Risk Points would be at least 3 if recurs. (CHF, Vascular disease, Pre-diabetes)  4. Smoking:  - Has stopped smoking. Using nicotine patches.   Discussed ICD and plan for next several months including med titration and repeat Echo.  Labs today. No room  to up-titrate meds with soft pressures.   Graciella Freer, [name] 04/16/17  Greater than 50% of the 25 minute visit was spent in counseling/coordination of care regarding disease state education, salt/fluid restriction, sliding scale diuretics, and medication compliance.

## 2017-04-21 ENCOUNTER — Ambulatory Visit: Payer: 59 | Admitting: Cardiology

## 2017-04-29 ENCOUNTER — Other Ambulatory Visit: Payer: Self-pay | Admitting: Thoracic Surgery (Cardiothoracic Vascular Surgery)

## 2017-04-29 DIAGNOSIS — Z951 Presence of aortocoronary bypass graft: Secondary | ICD-10-CM

## 2017-04-30 ENCOUNTER — Other Ambulatory Visit (HOSPITAL_COMMUNITY): Payer: Self-pay | Admitting: *Deleted

## 2017-04-30 ENCOUNTER — Telehealth (HOSPITAL_COMMUNITY): Payer: Self-pay | Admitting: *Deleted

## 2017-04-30 MED ORDER — ONDANSETRON 4 MG PO TBDP: 4.0000 mg | ORAL_TABLET | Freq: Three times a day (TID) | ORAL | 0 refills | Status: DC | PRN

## 2017-04-30 NOTE — Telephone Encounter (Signed)
Patients wife called to report that her whole family has a stomach bug and the patient now has it. He is ubnable to keep down medications. Per Mardelle MatteAndy send Zofran 4mg  OTD tab #20 to pharmacy and to hold lasix until he feels better (able to eat). pts wife aware and agreeable with plan.

## 2017-05-04 ENCOUNTER — Ambulatory Visit
Admission: RE | Admit: 2017-05-04 | Discharge: 2017-05-04 | Disposition: A | Discharge status: Home / Self Care | Payer: 59 | Source: Ambulatory Visit | Attending: Thoracic Surgery (Cardiothoracic Vascular Surgery) | Admitting: Thoracic Surgery (Cardiothoracic Vascular Surgery)

## 2017-05-04 ENCOUNTER — Other Ambulatory Visit: Payer: Self-pay

## 2017-05-04 ENCOUNTER — Ambulatory Visit (INDEPENDENT_AMBULATORY_CARE_PROVIDER_SITE_OTHER): Payer: Self-pay | Admitting: Surgical

## 2017-05-04 VITALS — BP 106/78 | HR 90 | Resp 18 | Ht 69.0 in | Wt 198.6 lb

## 2017-05-04 DIAGNOSIS — Z951 Presence of aortocoronary bypass graft: Secondary | ICD-10-CM

## 2017-05-04 NOTE — Patient Instructions (Signed)
Discussed activity progression and driving instructions °

## 2017-05-04 NOTE — Progress Notes (Signed)
[address]       [place] [address]             [phone]      [facility] Medical Record #[id number] Date of Birth: April 01, 1955  Referring: Kathleene Hazel* Primary Care: Patient, No Pcp Per Primary Cardiologist: No primary care provider on file.   Chief Complaint:   POST OP FOLLOW UP  Date of Procedure:    03/31/2017  Preoperative Diagnosis:        Severe 3-vessel Coronary Artery Disease  S/P Acute ST Segment Elevation Myocardial Infarction  Cardiogenic Shock  Ischemic Cardiomyopathy with Acute on Chronic Systolic Congestive Heart Failure  Paroxysmal Atrial Fibrillation  Postoperative Diagnosis:    Same  Procedure:        Coronary Artery Bypass Grafting x 5              Left Internal Mammary Artery to Distal Left Anterior Descending Coronary Artery             Saphenous Vein Graft to Posterior Descending Coronary Artery             Sequential Saphenous Vein Graft to Posterolateral Branch of Distal Right Coronary Artery             Saphenous Vein Graft to Distal Left Circumflex Coronary Artery             Sapheonous Vein Graft to Intermediate Branch Coronary Artery             Endoscopic Vein Harvest from Bilateral Thighs and Left Lower Leg             Clipping of Left Atrial Appendage  Surgeon:        Salvatore Decent. Cornelius Moras, MD  Assistant:       Lowella Dandy, [name]  Anesthesia:    Heather Roberts, MD  Operative Findings: ? Severe left ventricular systolic dysfunction ? Good quality left internal mammary artery conduit ? Good quality saphenous vein conduit ? Fairly good quality target vessels for grafting with exception of left anterior descending and distal left circumflex coronary arteries   History of Present Illness:   The patient is a 62 year old gentleman status post the above described procedure seen in the office on today's date for routine surgical follow-up.  He reports he has been a little bit under  the weather for the past 3 days primarily with some upper airway congestion and just not feeling quite as energetic.  He feels better today.  He did have some loose stools also during this time but that has also improved.  He stopped taking his Lasix and potassium during this time period because he felt like he may be getting dehydrated.  There is far as his overall recovery, he feels quite well.  He denies shortness of breath or anginal equivalents.  He has not had any palpitations.  He has had no difficulties with his incisions although he does have some degree of numbness in the left medial thigh which could be related to his saphenous vein harvest.  His ambulation continues to steadily improve.  He is scheduled to see cardiology tomorrow.     Past Medical History:  Diagnosis Date  . Acute on chronic systolic heart failure (HCC) 03/29/2017  . Anginal pain (HCC)    this admission  . Cardiomyopathy, ischemic 03/30/2017  . Coronary artery disease    this admission  . Coronary artery disease involving native coronary  artery of native heart with unstable angina pectoris (HCC) 03/29/2017  . Dyspnea    this admission  . Dysrhythmia    afib with this admission/runs of VTACH/Afib both with this admission  . Hyperlipidemia   . Myocardial infarction [facility](HCC)    this admission  . Paroxysmal atrial fibrillation (HCC) 03/29/2017  . Tobacco abuse   . Type II diabetes mellitus (HCC) 03/30/2017     Social History   Tobacco Use  Smoking Status Former Smoker  . Packs/day: 1.00  . Years: 30.00  . Pack years: 30.00  . Types: Cigarettes  Smokeless Tobacco Never Used    Social History   Substance and Sexual Activity  Alcohol Use No  . Frequency: Never     No Known Allergies  Current Outpatient Medications  Medication Sig Dispense Refill  . aspirin EC 81 MG EC tablet Take 1 tablet (81 mg total) by mouth daily.    [name]. atorvastatin (LIPITOR) 80 MG tablet Take 1 tablet (80 mg total) by mouth daily at 6 PM.  30 tablet 1  . carvedilol (COREG) 3.125 MG tablet Take 1 tablet (3.125 mg total) by mouth 2 (two) times daily with a meal. 60 tablet 1  . clopidogrel (PLAVIX) 75 MG tablet Take 1 tablet (75 mg total) by mouth daily. 30 tablet 1  . furosemide (LASIX) 40 MG tablet Take 1 tablet (40 mg total) by mouth 2 (two) times daily. 60 tablet 1  . losartan (COZAAR) 25 MG tablet Take 1 tablet (25 mg total) by mouth daily. 30 tablet 1  . nicotine (NICODERM CQ - DOSED IN MG/24 HOURS) 14 mg/24hr patch Place 1 patch (14 mg total) onto the skin daily. 28 patch 0  . ondansetron (ZOFRAN ODT) 4 MG disintegrating tablet Take 1 tablet (4 mg total) by mouth every 8 (eight) hours as needed for nausea or vomiting. 20 tablet 0  . oxyCODONE (OXY IR/ROXICODONE) 5 MG immediate release tablet Take 1 tablet (5 mg total) by mouth every 4 (four) hours as needed for severe pain. 30 tablet 0  . potassium chloride SA (K-DUR,KLOR-CON) 20 MEQ tablet Take 1 tablet (20 mEq total) by mouth 2 (two) times daily. 60 tablet 1  . spironolactone (ALDACTONE) 25 MG tablet Take 0.5 tablets (12.5 mg total) by mouth daily. 30 tablet 1   No current facility-administered medications for this visit.        Physical Exam: BP 106/78 (BP Location: Right Arm, Patient Position: Sitting, Cuff Size: Normal)   Pulse 90   Resp 18   Ht 5\' 9"  (1.753 m)   Wt 198 lb 9.6 oz (90.1 kg)   SpO2 98% Comment: RA  BMI 29.33 kg/m   General appearance: alert, cooperative and no distress Heart: regular rate and rhythm Lungs: clear to auscultation bilaterally Abdomen: soft, non-tender; bowel sounds normal; no masses,  no organomegaly Extremities: extremities normal, atraumatic, no cyanosis or edema Wound: Incisions healing well without signs of infection.   Diagnostic Studies & Laboratory data:     Recent Radiology Findings:   Dg Chest 2 View  Result Date: 05/04/2017 CLINICAL DATA:  CABG. EXAM: CHEST  2 VIEW COMPARISON:  04/04/2017 FINDINGS: The lungs are  clear without focal pneumonia, edema, pneumothorax or pleural effusion. The cardiopericardial silhouette is within normal limits for size. Patient is status post CABG. Left atrial appendage excluded device noted. The visualized bony structures of the thorax are intact. IMPRESSION: No active cardiopulmonary disease. Electronically Signed   By: Jamison OkaEric  Mansell M.D.  On: 05/04/2017 12:57      Recent Lab Findings: Lab Results  Component Value Date   WBC 10.2 04/09/2017   HGB 11.0 (L) 04/09/2017   HCT 33.6 (L) 04/09/2017   PLT 239 04/09/2017   GLUCOSE 138 (H) 04/16/2017   CHOL 277 (H) 03/29/2017   TRIG 299 (H) 03/29/2017   HDL 37 (L) 03/29/2017   LDLCALC 180 (H) 03/29/2017   ALT 137 (H) 04/03/2017   AST 116 (H) 04/03/2017   NA 135 04/16/2017   K 4.3 04/16/2017   CL 99 (L) 04/16/2017   CREATININE 1.03 04/16/2017   BUN 23 (H) 04/16/2017   CO2 23 04/16/2017   TSH 4.454 03/29/2017   INR 1.31 03/31/2017   HGBA1C 6.2 (H) 03/30/2017      Assessment / Plan: Overall patient is doing quite well.  He has cardiology follow-up appointment tomorrow.  I told him to continue his spironolactone but did not restart his Lasix or potassium at this time as his chest x-ray is quite clear and he has no evidence of congestive failure on exam.  We have reviewed activity progression as well as lifestyle modification particular diabetes management.  He is currently handling it with nutrition and no medications.  We will see him again in 2 months for follow-up.          Rowe Clack, [name] 05/04/2017 1:23 PM

## 2017-05-05 ENCOUNTER — Ambulatory Visit (HOSPITAL_COMMUNITY)
Admission: RE | Admit: 2017-05-05 | Discharge: 2017-05-05 | Disposition: A | Discharge status: Home / Self Care | Payer: 59 | Source: Ambulatory Visit | Attending: Cardiology | Admitting: Cardiology

## 2017-05-05 VITALS — BP 102/78 | HR 83 | Wt 200.0 lb

## 2017-05-05 DIAGNOSIS — I480 Paroxysmal atrial fibrillation: Secondary | ICD-10-CM | POA: Diagnosis not present

## 2017-05-05 DIAGNOSIS — I252 Old myocardial infarction: Secondary | ICD-10-CM | POA: Insufficient documentation

## 2017-05-05 DIAGNOSIS — I255 Ischemic cardiomyopathy: Secondary | ICD-10-CM

## 2017-05-05 DIAGNOSIS — Z951 Presence of aortocoronary bypass graft: Secondary | ICD-10-CM | POA: Diagnosis not present

## 2017-05-05 DIAGNOSIS — I2510 Atherosclerotic heart disease of native coronary artery without angina pectoris: Secondary | ICD-10-CM | POA: Insufficient documentation

## 2017-05-05 DIAGNOSIS — I5022 Chronic systolic (congestive) heart failure: Secondary | ICD-10-CM | POA: Insufficient documentation

## 2017-05-05 DIAGNOSIS — F17200 Nicotine dependence, unspecified, uncomplicated: Secondary | ICD-10-CM | POA: Insufficient documentation

## 2017-05-05 LAB — BASIC METABOLIC PANEL
Anion gap: 11 (ref 5–15)
BUN: 19 mg/dL (ref 6–20)
CALCIUM: 9.2 mg/dL (ref 8.9–10.3)
CO2: 21 mmol/L — AB (ref 22–32)
CREATININE: 1.09 mg/dL (ref 0.61–1.24)
Chloride: 104 mmol/L (ref 101–111)
Glucose, Bld: 118 mg/dL — ABNORMAL HIGH (ref 65–99)
Potassium: 3.9 mmol/L (ref 3.5–5.1)
Sodium: 136 mmol/L (ref 135–145)

## 2017-05-05 MED ORDER — ATORVASTATIN CALCIUM 80 MG PO TABS: 80.0000 mg | ORAL_TABLET | Freq: Every day | ORAL | 5 refills | Status: DC

## 2017-05-05 MED ORDER — CARVEDILOL 3.125 MG PO TABS: 3.1250 mg | ORAL_TABLET | Freq: Two times a day (BID) | ORAL | 5 refills | Status: DC

## 2017-05-05 MED ORDER — CLOPIDOGREL BISULFATE 75 MG PO TABS: 75.0000 mg | ORAL_TABLET | Freq: Every day | ORAL | 5 refills | Status: DC

## 2017-05-05 MED ORDER — LOSARTAN POTASSIUM 25 MG PO TABS: 25.0000 mg | ORAL_TABLET | Freq: Every day | ORAL | 5 refills | Status: DC

## 2017-05-05 MED ORDER — SPIRONOLACTONE 25 MG PO TABS: 12.5000 mg | ORAL_TABLET | Freq: Every day | ORAL | 5 refills | Status: DC

## 2017-05-05 NOTE — Progress Notes (Signed)
HF MD: [facility]  HPI:  Ryan Durham is a 62 y.o. Caucasian male with CAD s/p CABG x 5 03/2017, systolic CHF 2/2 ICM, Afib in setting of STEMI, and tobacco abuse.   Admitted 1/6 -> 04/09/17 with NSTEMI with sudden onset of chest tightness and SOB. Noted to be in rapid Afib on arrival. He spontaneously converted to NSR with treatment of his chest pain. Underwent LHC that showed critical 3vD. IABP placed with cardiogenic shock and TCTS consulted for CABG. Pt underwent CABG 03/31/2017 with no immediate complications. Pressors weaned and extubated as tolerated. Weaned off IABP POD #1. Pt diuresed with IV lasix and HF medications adjusted as tolerated.  Discharge weight 217 lbs   He presents today with his wife for pharmacist-led HF medication titration. At last HF clinic visit on 04/16/17, no medication changes were made. On 2/7, he called HF clinic stating that his whole family had a "stomach bug" and he was instructed to stay off of furosemide and KCl until he is able to eat. At TCTS appointment yesterday, he was told to stay off of furosemide and KCl. He is still having some diarrhea but it is slowly improving. No longer vomiting. Denies fevers, chills, CP. He is not smoking, but using nicotine patch when he "gets the urge". Since GI symptoms started last week, has been feeling more dizzy/lightheaded intermittently. 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 He is not willing to start cardiac rehab at [facility] since they were told it would be a $100 copay every visit (39 visits total).     . Shortness of breath/dyspnea on exertion? no  . Orthopnea/PND? no . Edema? no . Lightheadedness/dizziness? Yes - intermittently for about a week with "stomach bug" . Daily weights at home? Yes - stable ~190 lb  . Blood pressure/heart rate monitoring at home? no . Following low-sodium/fluid-restricted diet? yes  HF Medications: Carvedilol  3.125 mg PO BID Losartan 25 mg PO daily Spironolactone 12.5 mg PO daily   Has the patient been experiencing any side effects to the medications prescribed?  no  Does the patient have any problems obtaining medications due to transportation or finances?   no  Understanding of regimen: fair Understanding of indications: fair Potential of compliance: good Patient understands to avoid NSAIDs. Patient understands to avoid decongestants.    Pertinent Lab Values: . 05/05/17: Serum creatinine 1.09, BUN 19, Potassium 3.9, Sodium 136  Vital Signs: . Weight: 200 lb (last HF clinic visit weight: 210 lb) . Blood pressure: 102/78 mmHg   . Heart rate: 83 bpm    Assessment: 1. Chronicsystolic CHF (EF 65-78%), due to ICM s/p CABG. NYHA class IIsymptoms.  - Volume status stable off of lasix since 2/7  - Advised to call with any SOB, edema or weight gain >3 lb in 1 day or > 5 lb in 1 week as we may need to restart some lasix when he gets over current illness  - With soft BP, continue current regimen of carvedilol 3.125 mg BID, losartan 25 mg daily and spironolactone 12.5 mg daily  - Basic disease state pathophysiology, medication indication, mechanism and side effects reviewed at length with patient and he verbalized understanding 2. ION:GEXBMW FH premature CAD.NSTEMI, now s/p CABG x 5 03/31/2017 - Continue ASA 81, Plavix, and statin.  3. Atrial fibrillation: Paroxysmal.  - Only noted at initial admission in setting of severe CP/NSTEMI. - Maintaining NSR. -  If recurs, he will need to be anticoagulated (holding off for now). - CHA2DS2/VASc Stroke Risk Points would be at least 3 if recurs. (CHF, Vascular disease, Pre-diabetes)  4. Smoking:  - Has stopped smoking. Using nicotine patches.   Plan: 1) Medication changes: Based on clinical presentation, vital signs and recent labs will continue current regimen 2) Labs: BMET today  3) Follow-up: NP/PA on 05/19/17 to assess volume status off of  Lasix and Dr. Shirlee LatchMcLean on 06/17/17   Tyler DeisErika K. Bonnye FavaNicolsen, PharmD, BCPS, CPP Clinical Pharmacist Pager: [phone] Phone: [phone] 05/05/2017 11:23 AM

## 2017-05-05 NOTE — Patient Instructions (Signed)
It was great to see you today!  Continue your current medications as prescribed.   Blood work today. We will call you with any changes.   You are scheduled to see the NP/PA on 05/19/17.

## 2017-05-06 ENCOUNTER — Encounter (HOSPITAL_COMMUNITY): Payer: Self-pay

## 2017-05-06 NOTE — Progress Notes (Signed)
ST Disability [employer] Claim completed and signed by Dr. Shirlee LatchMcLean through 07/22/2017. Faxed to provided # [phone] Attn: Gwendolyn FillMarcy Carter (9 total pages sent with forms and supporting documentation). Copy of forms scanned into patient's electronic medical record.  Ave FilterBradley, Zaria Taha Genevea, RN

## 2017-05-19 ENCOUNTER — Ambulatory Visit (HOSPITAL_COMMUNITY)
Admission: RE | Admit: 2017-05-19 | Discharge: 2017-05-19 | Disposition: A | Discharge status: Home / Self Care | Payer: 59 | Source: Ambulatory Visit | Attending: Cardiology | Admitting: Cardiology

## 2017-05-19 ENCOUNTER — Encounter (HOSPITAL_COMMUNITY): Payer: Self-pay

## 2017-05-19 VITALS — BP 112/62 | HR 78 | Wt 208.0 lb

## 2017-05-19 DIAGNOSIS — Z7902 Long term (current) use of antithrombotics/antiplatelets: Secondary | ICD-10-CM | POA: Insufficient documentation

## 2017-05-19 DIAGNOSIS — Z7982 Long term (current) use of aspirin: Secondary | ICD-10-CM | POA: Diagnosis not present

## 2017-05-19 DIAGNOSIS — I25110 Atherosclerotic heart disease of native coronary artery with unstable angina pectoris: Secondary | ICD-10-CM | POA: Diagnosis not present

## 2017-05-19 DIAGNOSIS — I480 Paroxysmal atrial fibrillation: Secondary | ICD-10-CM | POA: Diagnosis not present

## 2017-05-19 DIAGNOSIS — I2510 Atherosclerotic heart disease of native coronary artery without angina pectoris: Secondary | ICD-10-CM | POA: Diagnosis present

## 2017-05-19 DIAGNOSIS — E119 Type 2 diabetes mellitus without complications: Secondary | ICD-10-CM | POA: Diagnosis not present

## 2017-05-19 DIAGNOSIS — Z79899 Other long term (current) drug therapy: Secondary | ICD-10-CM | POA: Insufficient documentation

## 2017-05-19 DIAGNOSIS — I5022 Chronic systolic (congestive) heart failure: Secondary | ICD-10-CM | POA: Insufficient documentation

## 2017-05-19 DIAGNOSIS — Z87891 Personal history of nicotine dependence: Secondary | ICD-10-CM | POA: Insufficient documentation

## 2017-05-19 DIAGNOSIS — I252 Old myocardial infarction: Secondary | ICD-10-CM | POA: Insufficient documentation

## 2017-05-19 DIAGNOSIS — Z720 Tobacco use: Secondary | ICD-10-CM | POA: Diagnosis not present

## 2017-05-19 DIAGNOSIS — Z951 Presence of aortocoronary bypass graft: Secondary | ICD-10-CM

## 2017-05-19 DIAGNOSIS — I255 Ischemic cardiomyopathy: Secondary | ICD-10-CM | POA: Diagnosis not present

## 2017-05-19 DIAGNOSIS — E785 Hyperlipidemia, unspecified: Secondary | ICD-10-CM | POA: Insufficient documentation

## 2017-05-19 DIAGNOSIS — Z8249 Family history of ischemic heart disease and other diseases of the circulatory system: Secondary | ICD-10-CM | POA: Insufficient documentation

## 2017-05-19 MED ORDER — SACUBITRIL-VALSARTAN 24-26 MG PO TABS: 1.0000 | ORAL_TABLET | Freq: Two times a day (BID) | ORAL | 11 refills | Status: DC

## 2017-05-19 NOTE — Patient Instructions (Signed)
STOP Losartan.  START Entresto 24-26 mg tablet twice daily. Use 30 day free card, then use monthly copay card.  Return in 2 weeks for labs.  ______________________________________________________ [place] [facility] Code: 9002  Follow up 4 weeks with Amy Clegg NP-C.  _______________________________________________________ [place] Code: 9002  Take all medication as prescribed the day of your appointment. Bring all medications with you to your appointment.  Do the following things EVERYDAY: 1) Weigh yourself in the morning before breakfast. Write it down and keep it in a log. 2) Take your medicines as prescribed 3) Eat low salt foods-Limit salt (sodium) to 2000 mg per day.  4) Stay as active as you can everyday 5) Limit all fluids for the day to less than 2 liters

## 2017-05-19 NOTE — Progress Notes (Signed)
Advanced Heart Failure Clinic Note    Primary Care: None  Primary Cardiologist: Dr. Shirlee Latch  Cardiac Surgeon: Dr Cornelius Moras   HPI: Ryan Durham is a 62 y.o. 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   Admitted 1/6 -> 04/09/17 with NSTEMI with sudden onset of chest tightness and SOB. Noted to be in rapid Afib on arrival. He spontaneously converted to NSR with treatment of his chest pain. Underwent LHC that showed critical 3vD. IABP placed with cardiogenic shock and TCTS consulted for CABG. Pt underwent CABG 03/31/2017 with no immediate complications. Pressors weaned and extubated as tolerated. Weaned off IABP POD #1. Pt diuresed with IV lasix and HF medications adjusted as tolerated.  Discharge weight 217 lbs  Today he returns for HF follow up with his wife. Overall feeling fine. Denies SOB/PND/Orthopnea. No chest pain. He has been able to walk 1 mile. Says it take about 30 minutes,  Appetite ok. No fever or chills. Weight at home has been trending up from 190 - 200 pounds. He has been following a low salt diet and limiting fluid intake to < 2 liters per day. Taking all medications.   Past Medical History 1. Chronic systolic CHF:  - ECHO 03/30/2017 EF 25-30%. Ischemic cardiomyopathy, now s/p CABG.  - Plan to repeat echo 3 months, ?ICD. Narrow QRS, not CRT candidate.  2. ZOX:WRUEAV FH premature CAD. - NSTEMI, now s/p CABG 2019 - Continue ASA 81, Plavix, and statin.  3. Atrial fibrillation: Paroxysmal.  - Only noted at initial admission 03/2017 in setting of severe CP/NSTEMI.  - Maintaining NSR.  - CHA2DS2/VAS Stroke Risk Points would be at least 3 if recurs.  (CHF, Vascular disease, Pre-diabetes)  4. Smoking:  - Have encouraged compete cessation.   Past Medical History:  Diagnosis Date  . Acute on chronic systolic heart failure (HCC) 03/29/2017  . Anginal pain (HCC)    this admission  . Cardiomyopathy, ischemic 03/30/2017  .  Coronary artery disease    this admission  . Coronary artery disease involving native coronary artery of native heart with unstable angina pectoris (HCC) 03/29/2017  . Dyspnea    this admission  . Dysrhythmia    afib with this admission/runs of VTACH/Afib both with this admission  . Hyperlipidemia   . Myocardial infarction [facility])    this admission  . Paroxysmal atrial fibrillation (HCC) 03/29/2017  . Tobacco abuse   . Type II diabetes mellitus (HCC) 03/30/2017    Current Outpatient Medications  Medication Sig Dispense Refill  . aspirin EC 81 MG EC tablet Take 1 tablet (81 mg total) by mouth daily.    [name] atorvastatin (LIPITOR) 80 MG tablet Take 1 tablet (80 mg total) by mouth daily at 6 PM. 30 tablet 5  . carvedilol (COREG) 3.125 MG tablet Take 1 tablet (3.125 mg total) by mouth 2 (two) times daily with a meal. 60 tablet 5  . clopidogrel (PLAVIX) 75 MG tablet Take 1 tablet (75 mg total) by mouth daily. 30 tablet 5  . losartan (COZAAR) 25 MG tablet Take 1 tablet (25 mg total) by mouth daily. 30 tablet 5  . spironolactone (ALDACTONE) 25 MG tablet Take 0.5 tablets (12.5 mg total) by mouth daily. 15 tablet 5  . nicotine (NICODERM CQ - DOSED IN MG/24 HOURS) 14 mg/24hr patch Place 1 patch (14 mg total) onto the skin daily. (Patient not taking: Reported on 05/05/2017) 28 patch 0  . ondansetron (  ZOFRAN ODT) 4 MG disintegrating tablet Take 1 tablet (4 mg total) by mouth every 8 (eight) hours as needed for nausea or vomiting. (Patient not taking: Reported on 05/19/2017) 20 tablet 0  . oxyCODONE (OXY IR/ROXICODONE) 5 MG immediate release tablet Take 1 tablet (5 mg total) by mouth every 4 (four) hours as needed for severe pain. (Patient not taking: Reported on 05/05/2017) 30 tablet 0   No current facility-administered medications for this encounter.    No Known Allergies  Social History   Socioeconomic History  . Marital status: Married    Spouse name: Not on file  . Number of children: Not on file  .  Years of education: Not on file  . Highest education level: Not on file  Social Needs  . Financial resource strain: Not on file  . Food insecurity - worry: Not on file  . Food insecurity - inability: Not on file  . Transportation needs - medical: Not on file  . Transportation needs - non-medical: Not on file  Occupational History  . Not on file  Tobacco Use  . Smoking status: Former Smoker    Packs/day: 1.00    Years: 30.00    Pack years: 30.00    Types: Cigarettes  . Smokeless tobacco: Never Used  Substance and Sexual Activity  . Alcohol use: No    Frequency: Never  . Drug use: No  . Sexual activity: Not on file  Other Topics Concern  . Not on file  Social History Narrative   Works in Naval architect for [employer]   Family History  Problem Relation Age of Onset  . Cirrhosis Mother   . Heart attack Sister   . Myocarditis Brother    Vitals:   05/19/17 1142  BP: 112/62  Pulse: 78  SpO2: 99%  Weight: 208 lb (94.3 kg)   Wt Readings from Last 3 Encounters:  05/19/17 208 lb (94.3 kg)  05/05/17 200 lb (90.7 kg)  05/04/17 198 lb 9.6 oz (90.1 kg)    PHYSICAL EXAM: General:  Well appearing. No resp difficulty. Walked in the clinic with his wife. HEENT: normal Neck: supple. JVP 8-9 . Carotids 2+ bilat; no bruits. No lymphadenopathy or thryomegaly appreciated. Cor: PMI nondisplaced. Regular rate & rhythm. No rubs, gallops or murmurs. Lungs: clear Abdomen: soft, nontender, nondistended. No hepatosplenomegaly. No bruits or masses. Good bowel sounds. Extremities: no cyanosis, clubbing, rash, edema Neuro: alert & orientedx3, cranial nerves grossly intact. moves all 4 extremities w/o difficulty. Affect pleasant  ASSESSMENT & PLAN:  1. Chronic systolic CHF: ECHO 03/30/2017 EF 25-30%. ICM, now s/p CABG as below - NYHA 1. Lasix was stopped a few weeks ago.  Mild volume overload today. Hold off on lasix but will add entresto 24-26 mg twice a day. HE was provided with 30  day free card.  Stop losartan.  - Continue carvedilol 3.125 mg twice a day.  - Continue spironolactone 12.5 daily.  - Plan to repeat echo April . Narrow QRS, not CRT candidate.  2. UEA:VWUJWJ FH premature CAD.NSTEMI, now s/p CABG x 5 03/31/2017 - No S/S ischemia  - Continue ASA 81, Plavix, and statin.  - Unable to pay for cardiac rehab. Continue current walking plan at the gym.  3. Atrial fibrillation: Paroxysmal.  - Only noted at initial admission in setting of severe CP/NSTEMI.  - Regular pulse. No recurrence  - If recurs, he will need to be anticoagulated (holding off for now). - CHA2DS2/VAS Stroke Risk Points would  be at least 3 if recurs. (CHF, Vascular disease, Pre-diabetes)  4. Smoking:  Has not smoked since January.    Follow up in 10 days for BMET and 4 weeks for follow up.   Greater than 50% of the (total minutes 25) visit spent in counseling/coordination of care regarding medication changes and the purpose of entresto.    Tonye BecketAmy Clegg, NP 05/19/17

## 2017-05-28 ENCOUNTER — Ambulatory Visit (HOSPITAL_COMMUNITY)
Admission: RE | Admit: 2017-05-28 | Discharge: 2017-05-28 | Disposition: A | Discharge status: Home / Self Care | Payer: 59 | Source: Ambulatory Visit | Attending: Internal Medicine | Admitting: Internal Medicine

## 2017-05-28 DIAGNOSIS — I255 Ischemic cardiomyopathy: Secondary | ICD-10-CM | POA: Diagnosis present

## 2017-05-28 LAB — BASIC METABOLIC PANEL
ANION GAP: 9 (ref 5–15)
BUN: 15 mg/dL (ref 6–20)
CHLORIDE: 105 mmol/L (ref 101–111)
CO2: 24 mmol/L (ref 22–32)
Calcium: 9.3 mg/dL (ref 8.9–10.3)
Creatinine, Ser: 0.92 mg/dL (ref 0.61–1.24)
GFR calc non Af Amer: >60 mL/min (ref >60)
GLUCOSE: 98 mg/dL (ref 65–99)
POTASSIUM: 4.7 mmol/L (ref 3.5–5.1)
Sodium: 138 mmol/L (ref 135–145)

## 2017-06-09 ENCOUNTER — Telehealth (HOSPITAL_COMMUNITY): Payer: Self-pay | Admitting: Pharmacist

## 2017-06-09 NOTE — Telephone Encounter (Signed)
Entresto 24-26 mg BID PA [facility] 06[facility]10[facility].   Tyler DeisErika K. Bonnye FavaNicolsen, PharmD, BCPS, CPP Clinical Pharmacist Phone: [phone] 06/09/2017 3:19 PM

## 2017-06-17 ENCOUNTER — Encounter (HOSPITAL_COMMUNITY): Payer: Self-pay | Admitting: Cardiology

## 2017-06-17 ENCOUNTER — Ambulatory Visit (HOSPITAL_COMMUNITY)
Admission: RE | Admit: 2017-06-17 | Discharge: 2017-06-17 | Disposition: A | Discharge status: Home / Self Care | Payer: 59 | Source: Ambulatory Visit | Attending: Cardiology | Admitting: Cardiology

## 2017-06-17 VITALS — BP 99/61 | HR 87 | Wt 211.8 lb

## 2017-06-17 DIAGNOSIS — Z951 Presence of aortocoronary bypass graft: Secondary | ICD-10-CM | POA: Diagnosis not present

## 2017-06-17 DIAGNOSIS — I480 Paroxysmal atrial fibrillation: Secondary | ICD-10-CM | POA: Insufficient documentation

## 2017-06-17 DIAGNOSIS — Z7902 Long term (current) use of antithrombotics/antiplatelets: Secondary | ICD-10-CM | POA: Insufficient documentation

## 2017-06-17 DIAGNOSIS — I5022 Chronic systolic (congestive) heart failure: Secondary | ICD-10-CM | POA: Insufficient documentation

## 2017-06-17 DIAGNOSIS — E785 Hyperlipidemia, unspecified: Secondary | ICD-10-CM | POA: Diagnosis not present

## 2017-06-17 DIAGNOSIS — Z8249 Family history of ischemic heart disease and other diseases of the circulatory system: Secondary | ICD-10-CM | POA: Insufficient documentation

## 2017-06-17 DIAGNOSIS — Z7982 Long term (current) use of aspirin: Secondary | ICD-10-CM | POA: Diagnosis not present

## 2017-06-17 DIAGNOSIS — Z87891 Personal history of nicotine dependence: Secondary | ICD-10-CM | POA: Diagnosis not present

## 2017-06-17 DIAGNOSIS — I255 Ischemic cardiomyopathy: Secondary | ICD-10-CM

## 2017-06-17 DIAGNOSIS — I2510 Atherosclerotic heart disease of native coronary artery without angina pectoris: Secondary | ICD-10-CM | POA: Diagnosis present

## 2017-06-17 DIAGNOSIS — I252 Old myocardial infarction: Secondary | ICD-10-CM | POA: Insufficient documentation

## 2017-06-17 DIAGNOSIS — Z79899 Other long term (current) drug therapy: Secondary | ICD-10-CM | POA: Insufficient documentation

## 2017-06-17 LAB — BASIC METABOLIC PANEL
Anion gap: 10 (ref 5–15)
BUN: 14 mg/dL (ref 6–20)
CO2: 24 mmol/L (ref 22–32)
CREATININE: 1.02 mg/dL (ref 0.61–1.24)
Calcium: 9.3 mg/dL (ref 8.9–10.3)
Chloride: 104 mmol/L (ref 101–111)
GFR calc Af Amer: >60 mL/min (ref >60)
GFR calc non Af Amer: >60 mL/min (ref >60)
GLUCOSE: 259 mg/dL — AB (ref 65–99)
Potassium: 4.3 mmol/L (ref 3.5–5.1)
Sodium: 138 mmol/L (ref 135–145)

## 2017-06-17 LAB — LIPID PANEL
Cholesterol: 106 mg/dL (ref 0–200)
HDL: 26 mg/dL — ABNORMAL LOW (ref >40)
LDL CALC: 56 mg/dL (ref 0–99)
Total CHOL/HDL Ratio: 4.1 RATIO
Triglycerides: 119 mg/dL (ref <150)
VLDL: 24 mg/dL (ref 0–40)

## 2017-06-17 MED ORDER — CARVEDILOL 3.125 MG PO TABS: 6.2500 mg | ORAL_TABLET | Freq: Two times a day (BID) | ORAL | 5 refills | Status: DC

## 2017-06-17 NOTE — Patient Instructions (Signed)
Increase Carvedilol 6.25 mg (1 tabs), twice a day  Labs drawn today (if we do not call you, then your lab work was stable)   Your physician has requested that you have an echocardiogram. Echocardiography is a painless test that uses sound waves to create images of your heart. It provides your doctor with information about the size and shape of your heart and how well your heart's chambers and valves are working. This procedure takes approximately one hour. There are no restrictions for this procedure.  Your physician recommends that you schedule a follow-up appointment in: 1 month with Dr. Shirlee LatchMcLean   an a echocardiogram       .

## 2017-06-18 NOTE — Progress Notes (Signed)
Advanced Heart Failure Clinic Note    Primary Care: None  Primary Cardiologist: Dr. Shirlee Latch  Cardiac Surgeon: Dr Cornelius Moras   HPI: Ryan Durham is a 62 y.o. 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   Admitted 1/6 -> 04/09/17 with NSTEMI with sudden onset of chest tightness and SOB. Noted to be in rapid Afib on arrival. He spontaneously converted to NSR with treatment of his chest pain. Underwent LHC that showed critical 3vD. IABP placed with cardiogenic shock and TCTS consulted for CABG. Pt underwent CABG 03/31/2017 with no immediate complications. Pressors weaned and extubated as tolerated. Weaned off IABP POD #1. Pt diuresed with IV lasix and HF medications adjusted as tolerated.  Discharge weight 217 lbs  He returns for followup of CHF and CAD.  Feeling good generally.  He has joined a gym and walks 30 minutes/day.  No exertional dyspnea.  NO orthopnea/PND.  No chest pain.  Not smoking.  Not lightheaded.  Weight up about 3 lbs.    Past Medical History 1. Chronic systolic CHF: ECHO 03/30/2017 EF 25-30%. Ischemic cardiomyopathy, now s/p CABG.  2. ZOX:WRUEAV FH premature CAD.NSTEMI 1/19, now s/p CABG 03/2017 3. Atrial fibrillation: Paroxysmal. Noted only in setting of NSTEMI.  4. Smoking: Has quit.   ROS: All systems reviewed and negative except as per HPI.    Current Outpatient Medications  Medication Sig Dispense Refill  . aspirin EC 81 MG EC tablet Take 1 tablet (81 mg total) by mouth daily.    [name] atorvastatin (LIPITOR) 80 MG tablet Take 1 tablet (80 mg total) by mouth daily at 6 PM. 30 tablet 5  . carvedilol (COREG) 3.125 MG tablet Take 2 tablets (6.25 mg total) by mouth 2 (two) times daily with a meal. 120 tablet 5  . clopidogrel (PLAVIX) 75 MG tablet Take 1 tablet (75 mg total) by mouth daily. 30 tablet 5  . oxyCODONE (OXY IR/ROXICODONE) 5 MG immediate release tablet Take 1 tablet (5 mg total) by mouth every 4 (four) hours as  needed for severe pain. 30 tablet 0  . sacubitril-valsartan (ENTRESTO) 24-26 MG Take 1 tablet by mouth 2 (two) times daily. 60 tablet 11  . spironolactone (ALDACTONE) 25 MG tablet Take 0.5 tablets (12.5 mg total) by mouth daily. 15 tablet 5  . nicotine (NICODERM CQ - DOSED IN MG/24 HOURS) 14 mg/24hr patch Place 1 patch (14 mg total) onto the skin daily. (Patient not taking: Reported on 05/05/2017) 28 patch 0  . ondansetron (ZOFRAN ODT) 4 MG disintegrating tablet Take 1 tablet (4 mg total) by mouth every 8 (eight) hours as needed for nausea or vomiting. (Patient not taking: Reported on 05/19/2017) 20 tablet 0   No current facility-administered medications for this encounter.    No Known Allergies  Social History   Socioeconomic History  . Marital status: Married    Spouse name: Not on file  . Number of children: Not on file  . Years of education: Not on file  . Highest education level: Not on file  Occupational History  . Not on file  Social Needs  . Financial resource strain: Not on file  . Food insecurity:    Worry: Not on file    Inability: Not on file  . Transportation needs:    Medical: Not on file    Non-medical: Not on file  Tobacco Use  . Smoking status: Former Smoker    Packs/day: 1.00  Years: 30.00    Pack years: 30.00    Types: Cigarettes  . Smokeless tobacco: Never Used  Substance and Sexual Activity  . Alcohol use: No    Frequency: Never  . Drug use: No  . Sexual activity: Not on file  Lifestyle  . Physical activity:    Days per week: Not on file    Minutes per session: Not on file  . Stress: Not on file  Relationships  . Social connections:    Talks on phone: Not on file    Gets together: Not on file    Attends religious service: Not on file    Active member of club or organization: Not on file    Attends meetings of clubs or organizations: Not on file    Relationship status: Not on file  . Intimate partner violence:    Fear of current or ex partner:  Not on file    Emotionally abused: Not on file    Physically abused: Not on file    Forced sexual activity: Not on file  Other Topics Concern  . Not on file  Social History Narrative   Works in Naval architectwarehouse for [employer]   Family History  Problem Relation Age of Onset  . Cirrhosis Mother   . Heart attack Sister   . Myocarditis Brother    Vitals:   06/17/17 1216  BP: 99/61  Pulse: 87  SpO2: 96%  Weight: 211 lb 12.8 oz (96.1 kg)   Wt Readings from Last 3 Encounters:  06/17/17 211 lb 12.8 oz (96.1 kg)  05/19/17 208 lb (94.3 kg)  05/05/17 200 lb (90.7 kg)    PHYSICAL EXAM: General: NAD Neck: No JVD, no thyromegaly or thyroid nodule.  Lungs: Decreased breath sounds bilaterally.  CV: Nondisplaced PMI.  Heart regular S1/S2, no S3/S4, no murmur.  No peripheral edema.  No carotid bruit.  Normal pedal pulses.  Abdomen: Soft, nontender, no hepatosplenomegaly, no distention.  Skin: Intact without lesions or rashes.  Neurologic: Alert and oriented x 3.  Psych: Normal affect. Extremities: No clubbing or cyanosis.  HEENT: Normal.   ASSESSMENT & PLAN:  1. Chronic systolic CHF: ECHO 03/30/2017 EF 25-30%. Ischemic cardiomyopathy, now s/p CABG as below. NYHA class II symptoms. He is not volume overloaded on exam.  - Increase Coreg to 6.25 mg bid.  - Continue Entresto 24/26 bid and spironolactone 12.5 daily.  BMET today.  - Plan to repeat echo April for ICD. Narrow QRS, not CRT candidate.  2. WUJ:WJXBJYCAD:Strong FH premature CAD.NSTEMI, now s/p CABG x 5 03/31/2017.  No chest pain.   - Continue ASA 81, Plavix, and statin. Check lipids today.  - Unable to pay for cardiac rehab. Continue current walking plan at the gym.  3. Atrial fibrillation: Paroxysmal. Only noted at initial admission in setting of severe CP/NSTEMI. NSR today.  - If recurs, he will need to be anticoagulated (holding off for now). 4. Smoking: Has not smoked since January.   Followup in 1 month with echo.   Marca Anconaalton  Yonael Tulloch, MD 06/18/17

## 2017-06-23 ENCOUNTER — Encounter (HOSPITAL_COMMUNITY): Payer: Self-pay

## 2017-07-02 ENCOUNTER — Other Ambulatory Visit: Payer: Self-pay | Admitting: Thoracic Surgery (Cardiothoracic Vascular Surgery)

## 2017-07-02 DIAGNOSIS — Z951 Presence of aortocoronary bypass graft: Secondary | ICD-10-CM

## 2017-07-06 ENCOUNTER — Other Ambulatory Visit: Payer: Self-pay

## 2017-07-06 ENCOUNTER — Ambulatory Visit
Admission: RE | Admit: 2017-07-06 | Discharge: 2017-07-06 | Disposition: A | Discharge status: Home / Self Care | Payer: 59 | Source: Ambulatory Visit | Attending: Thoracic Surgery (Cardiothoracic Vascular Surgery) | Admitting: Thoracic Surgery (Cardiothoracic Vascular Surgery)

## 2017-07-06 ENCOUNTER — Encounter: Payer: Self-pay | Admitting: Thoracic Surgery (Cardiothoracic Vascular Surgery)

## 2017-07-06 ENCOUNTER — Ambulatory Visit: Payer: 59 | Admitting: Thoracic Surgery (Cardiothoracic Vascular Surgery)

## 2017-07-06 VITALS — BP 81/57 | HR 71 | Resp 16 | Ht 69.0 in | Wt 203.0 lb

## 2017-07-06 DIAGNOSIS — Z951 Presence of aortocoronary bypass graft: Secondary | ICD-10-CM

## 2017-07-06 NOTE — Patient Instructions (Signed)
Continue all previous medications without any changes at this time  Make every effort to stay physically active, get some type of exercise on a regular basis, and stick to a "heart healthy diet".  The long term benefits for regular exercise and a healthy diet are critically important to your overall health and wellbeing.  Continue to avoid all tobacco products

## 2017-07-06 NOTE — Progress Notes (Signed)
[address]       [place] [id number]             [phone]     CARDIOTHORACIC SURGERY OFFICE NOTE  Referring Provider is Kathleene HazelMcAlhany, Christopher D* PCP is Patient, No Pcp Per   HPI:  Patient is a 62 year old male with no previous history of coronary artery disease but multiple risk factors including history of long-standing tobacco abuse, hyperlipidemia, and strong family history of coronary artery disease.  He presented with acute ST segment elevation myocardial infarction complicated by cardiogenic shock on March 31, 2017.  He underwent emergent coronary artery bypass grafting x5.  His postoperative recovery was somewhat slow but remarkably uneventful and he was discharged home on the ninth postoperative day.  Since hospital discharge he was seen in follow-up in our office on May 04, 2017 and more recently by  in the advanced congestive heart failure clinic Dr. Shirlee LatchMcLean on June 17, 2017.  His medical therapy was adjusted at that time and plans were made for follow-up echocardiogram to reassess left ventricular systolic function and consider whether or not the patient might need CRT therapy.  Patient returns the office today and reports that he is doing quite well.  He is ambulating regularly, as much is 1-1/2-2 miles daily.  He states that his exercise tolerance is gradually improving.  He has not had any chest pain or chest tightness.  He denies any PND, orthopnea, or lower extremity edema.  Overall he is pleased with his continued progress.   Current Outpatient Medications  Medication Sig Dispense Refill  . aspirin EC 81 MG EC tablet Take 1 tablet (81 mg total) by mouth daily.    [name]. atorvastatin (LIPITOR) 80 MG tablet Take 1 tablet (80 mg total) by mouth daily at 6 PM. 30 tablet 5  . carvedilol (COREG) 3.125 MG tablet Take 2 tablets (6.25 mg total) by mouth 2 (two) times daily with a meal. 120 tablet 5  . clopidogrel (PLAVIX) 75 MG tablet Take 1 tablet (75 mg  total) by mouth daily. 30 tablet 5  . sacubitril-valsartan (ENTRESTO) 24-26 MG Take 1 tablet by mouth 2 (two) times daily. 60 tablet 11  . spironolactone (ALDACTONE) 25 MG tablet Take 0.5 tablets (12.5 mg total) by mouth daily. 15 tablet 5  . ondansetron (ZOFRAN ODT) 4 MG disintegrating tablet Take 1 tablet (4 mg total) by mouth every 8 (eight) hours as needed for nausea or vomiting. (Patient not taking: Reported on 05/19/2017) 20 tablet 0   No current facility-administered medications for this visit.       Physical Exam:   BP (!) 81/57 (BP Location: Right Arm, Patient Position: Sitting, Cuff Size: Normal)   Pulse 71   Resp 16   Ht 5\' 9"  (1.753 m)   Wt 203 lb (92.1 kg)   SpO2 99% Comment: ON RA  BMI 29.98 kg/m   General:  Well-appearing  Chest:   Clear to auscultation  CV:   Regular rate and rhythm without murmur  Incisions:  Healing nicely, sternum is stable  Abdomen:  Soft nontender  Extremities:  Warm and well-perfused, no edema  Diagnostic Tests:  CHEST - 2 VIEW  COMPARISON:  Prior radiograph from 2/of/19.  FINDINGS: Median sternotomy wires underlying CABG markers and left atrial appendage clip noted. Stable heart size. Mediastinal silhouette normal. Mild aortic atherosclerosis.  Lungs normally inflated. No focal infiltrates. No pulmonary edema or pleural effusion. No pneumothorax.  Osseous structures within  normal limits.  IMPRESSION: 1. No active cardiopulmonary disease. 2. Stable sequelae of prior CABG.   Electronically Signed   By: Rise Mu M.D.   On: 07/06/2017 13:40   Impression:  Patient is doing quite well approximately 3 months status post emergency coronary artery bypass grafting for acute ST segment elevation myocardial infarction complicated by cardiogenic shock.  Plan:  We have not recommended any changes the patient's current medications.  I have encouraged him to continue to gradually increase his physical activity as  tolerated.  Once his follow-up echocardiogram has been performed and a decision has been made as to whether or not he needs a defibrillator, it may be possible for him to return to work.  I have instructed the patient that he should not return to work until Aug 01, 2017.  At that time he may return to work without limitations if he has been cleared for return to work by Dr. Shirlee Latch.  All of his questions have been addressed.  The patient will return to our office for follow-up next January, approximately 1 year following his surgery.  He will call and return sooner should specific problems or questions arise.  I spent in excess of 15 minutes during the conduct of this office consultation and >50% of this time involved direct face-to-face encounter with the patient for counseling and/or coordination of their care.    Salvatore Decent. Cornelius Moras, MD 07/06/2017 2:31 PM

## 2017-07-20 ENCOUNTER — Ambulatory Visit (HOSPITAL_COMMUNITY)
Admission: RE | Admit: 2017-07-20 | Discharge: 2017-07-20 | Disposition: A | Discharge status: Home / Self Care | Payer: 59 | Source: Ambulatory Visit | Attending: Cardiology | Admitting: Cardiology

## 2017-07-20 ENCOUNTER — Encounter (HOSPITAL_COMMUNITY): Payer: Self-pay | Admitting: Cardiology

## 2017-07-20 ENCOUNTER — Other Ambulatory Visit: Payer: Self-pay

## 2017-07-20 ENCOUNTER — Ambulatory Visit (HOSPITAL_BASED_OUTPATIENT_CLINIC_OR_DEPARTMENT_OTHER)
Admission: RE | Admit: 2017-07-20 | Discharge: 2017-07-20 | Disposition: A | Discharge status: Home / Self Care | Payer: 59 | Source: Ambulatory Visit | Attending: Cardiology | Admitting: Cardiology

## 2017-07-20 VITALS — BP 113/58 | HR 60 | Wt 216.0 lb

## 2017-07-20 DIAGNOSIS — I5022 Chronic systolic (congestive) heart failure: Secondary | ICD-10-CM | POA: Diagnosis not present

## 2017-07-20 DIAGNOSIS — Z951 Presence of aortocoronary bypass graft: Secondary | ICD-10-CM

## 2017-07-20 DIAGNOSIS — Z79899 Other long term (current) drug therapy: Secondary | ICD-10-CM | POA: Diagnosis not present

## 2017-07-20 DIAGNOSIS — Z7902 Long term (current) use of antithrombotics/antiplatelets: Secondary | ICD-10-CM | POA: Diagnosis not present

## 2017-07-20 DIAGNOSIS — I2510 Atherosclerotic heart disease of native coronary artery without angina pectoris: Secondary | ICD-10-CM | POA: Insufficient documentation

## 2017-07-20 DIAGNOSIS — I255 Ischemic cardiomyopathy: Secondary | ICD-10-CM | POA: Diagnosis not present

## 2017-07-20 DIAGNOSIS — I480 Paroxysmal atrial fibrillation: Secondary | ICD-10-CM | POA: Diagnosis not present

## 2017-07-20 DIAGNOSIS — Z7982 Long term (current) use of aspirin: Secondary | ICD-10-CM | POA: Diagnosis not present

## 2017-07-20 DIAGNOSIS — I252 Old myocardial infarction: Secondary | ICD-10-CM | POA: Insufficient documentation

## 2017-07-20 DIAGNOSIS — Z87891 Personal history of nicotine dependence: Secondary | ICD-10-CM | POA: Insufficient documentation

## 2017-07-20 DIAGNOSIS — Z8249 Family history of ischemic heart disease and other diseases of the circulatory system: Secondary | ICD-10-CM | POA: Insufficient documentation

## 2017-07-20 MED ORDER — SPIRONOLACTONE 25 MG PO TABS: 25.0000 mg | ORAL_TABLET | Freq: Every day | ORAL | 5 refills | Status: DC

## 2017-07-20 NOTE — Progress Notes (Signed)
  Echocardiogram 2D Echocardiogram has been performed.  Leta Jungling M 07/20/2017, 9:14 AM

## 2017-07-20 NOTE — Progress Notes (Signed)
Advanced Heart Failure Clinic Note    Primary Care: None  Primary Cardiologist: Dr. Shirlee Latch  Cardiac Surgeon: Dr Cornelius Moras   HPI: Ryan Durham is a 62 y.o. 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   Admitted 1/6 -> 04/09/17 with NSTEMI with sudden onset of chest tightness and SOB. Noted to be in rapid Afib on arrival. He spontaneously converted to NSR with treatment of his chest pain. Underwent LHC that showed critical 3vD. IABP placed with cardiogenic shock and TCTS consulted for CABG. Pt underwent CABG 03/31/2017 with no immediate complications. Pressors weaned and extubated as tolerated. Weaned off IABP POD #1. Pt diuresed with IV lasix and HF medications adjusted as tolerated.  Discharge weight 217 lbs  He returns for followup of CHF and CAD.  He continues to do well symptomatically.  He walks for 30 minutes/1.5 miles on most days on his treadmill.  He walks his dog.  No significant chest pain or exertional dyspnea. No orthopnea/PND.  No palpitations.   Labs (3/19): LDL 56, HDL 26, K 4.3, creatinine 1.61  Past Medical History 1. Chronic systolic CHF: ECHO 03/30/2017 EF 25-30%. Ischemic cardiomyopathy, now s/p CABG. - Echo (4/19): EF 30-35%, wall motion abnormalities, grade 2 diastolic dysfunction, normal RV size and systolic function.   2. WRU:EAVWUJ FH premature CAD.NSTEMI 1/19, now s/p CABG 03/2017 3. Atrial fibrillation: Paroxysmal. Noted only in setting of NSTEMI.  4. Smoking: Has quit.   ROS: All systems reviewed and negative except as per HPI.    Current Outpatient Medications  Medication Sig Dispense Refill  . aspirin EC 81 MG EC tablet Take 1 tablet (81 mg total) by mouth daily.    [name] atorvastatin (LIPITOR) 80 MG tablet Take 1 tablet (80 mg total) by mouth daily at 6 PM. 30 tablet 5  . carvedilol (COREG) 3.125 MG tablet Take 2 tablets (6.25 mg total) by mouth 2 (two) times daily with a meal. 120 tablet 5  . clopidogrel  (PLAVIX) 75 MG tablet Take 1 tablet (75 mg total) by mouth daily. 30 tablet 5  . ondansetron (ZOFRAN ODT) 4 MG disintegrating tablet Take 1 tablet (4 mg total) by mouth every 8 (eight) hours as needed for nausea or vomiting. 20 tablet 0  . sacubitril-valsartan (ENTRESTO) 24-26 MG Take 1 tablet by mouth 2 (two) times daily. 60 tablet 11  . spironolactone (ALDACTONE) 25 MG tablet Take 1 tablet (25 mg total) by mouth daily. 30 tablet 5   No current facility-administered medications for this encounter.    No Known Allergies  Social History   Socioeconomic History  . Marital status: Married    Spouse name: Not on file  . Number of children: Not on file  . Years of education: Not on file  . Highest education level: Not on file  Occupational History  . Not on file  Social Needs  . Financial resource strain: Not on file  . Food insecurity:    Worry: Not on file    Inability: Not on file  . Transportation needs:    Medical: Not on file    Non-medical: Not on file  Tobacco Use  . Smoking status: Former Smoker    Packs/day: 1.00    Years: 30.00    Pack years: 30.00    Types: Cigarettes  . Smokeless tobacco: Never Used  Substance and Sexual Activity  . Alcohol use: No    Frequency: Never  .  Drug use: No  . Sexual activity: Not on file  Lifestyle  . Physical activity:    Days per week: Not on file    Minutes per session: Not on file  . Stress: Not on file  Relationships  . Social connections:    Talks on phone: Not on file    Gets together: Not on file    Attends religious service: Not on file    Active member of club or organization: Not on file    Attends meetings of clubs or organizations: Not on file    Relationship status: Not on file  . Intimate partner violence:    Fear of current or ex partner: Not on file    Emotionally abused: Not on file    Physically abused: Not on file    Forced sexual activity: Not on file  Other Topics Concern  . Not on file  Social  History Narrative   Works in Naval architect for [employer]   Family History  Problem Relation Age of Onset  . Cirrhosis Mother   . Heart attack Sister   . Myocarditis Brother    Vitals:   07/20/17 1019  BP: (!) 113/58  Pulse: 60  SpO2: 99%  Weight: 216 lb (98 kg)   Wt Readings from Last 3 Encounters:  07/20/17 216 lb (98 kg)  07/06/17 203 lb (92.1 kg)  06/17/17 211 lb 12.8 oz (96.1 kg)    PHYSICAL EXAM: General: NAD Neck: No JVD, no thyromegaly or thyroid nodule.  Lungs: Clear to auscultation bilaterally with normal respiratory effort. CV: Nondisplaced PMI.  Heart regular S1/S2, no S3/S4, no murmur.  No peripheral edema.  No carotid bruit.  Normal pedal pulses.  Abdomen: Soft, nontender, no hepatosplenomegaly, no distention.  Skin: Intact without lesions or rashes.  Neurologic: Alert and oriented x 3.  Psych: Normal affect. Extremities: No clubbing or cyanosis.  HEENT: Normal.   ASSESSMENT & PLAN:  1. Chronic systolic CHF: ECHO 03/30/2017 EF 25-30%. Ischemic cardiomyopathy, now s/p CABG as below. Repeat echo in 4/19 with EF still low at 30-35%.  NYHA class II symptoms. He is not volume overloaded on exam.  - Continue Coreg 6.25 mg bid.  - Continue Entresto 24/26 bid.   - Increase spironolactone to 25 mg daily.  BMET in 10 days.  - I reviewed his repeat echo today, EF remains low at 30-35%.  Narrow QRS, not CRT candidate. I will refer to EP for ICD placement.  2. ZOX:WRUEAV FH premature CAD.NSTEMI, now s/p CABG x 5 03/31/2017.  No chest pain.   - Continue ASA 81, Plavix, and statin.  3. Atrial fibrillation: Paroxysmal. Only noted at initial admission in setting of severe CP/NSTEMI. NSR today.  - If recurs, he will need to be anticoagulated (holding off for now). 4. Smoking: Has not smoked since January.   Followup in 1 month with CHF pharmacy clinic for med titration and 3 months with me.   Marca Ancona, MD 07/20/17

## 2017-07-20 NOTE — Patient Instructions (Signed)
Increase Spironolactone to 25 mg (1 tab) daily  Labs in 7-10 days  Please follow up with Cicero Duck, our heart failure pharmacist in 1 month  You have been referred to EP to discuss getting a defibrillator implanted  Your physician recommends that you schedule a follow-up appointment in: 3 months

## 2017-07-21 ENCOUNTER — Encounter: Payer: Self-pay | Admitting: *Deleted

## 2017-07-21 ENCOUNTER — Encounter: Payer: Self-pay | Admitting: Cardiology

## 2017-07-21 ENCOUNTER — Ambulatory Visit (INDEPENDENT_AMBULATORY_CARE_PROVIDER_SITE_OTHER): Payer: 59 | Admitting: Cardiology

## 2017-07-21 VITALS — BP 96/56 | HR 67 | Ht 70.0 in | Wt 215.2 lb

## 2017-07-21 DIAGNOSIS — I255 Ischemic cardiomyopathy: Secondary | ICD-10-CM | POA: Diagnosis not present

## 2017-07-21 DIAGNOSIS — Z01812 Encounter for preprocedural laboratory examination: Secondary | ICD-10-CM | POA: Diagnosis not present

## 2017-07-21 DIAGNOSIS — I480 Paroxysmal atrial fibrillation: Secondary | ICD-10-CM

## 2017-07-21 DIAGNOSIS — I25810 Atherosclerosis of coronary artery bypass graft(s) without angina pectoris: Secondary | ICD-10-CM

## 2017-07-21 LAB — BASIC METABOLIC PANEL
BUN/Creatinine Ratio: 21 — ABNORMAL HIGH (ref 9–20)
BUN: 21 mg/dL (ref 6–24)
CALCIUM: 9.4 mg/dL (ref 8.7–10.2)
CHLORIDE: 102 mmol/L (ref 96–106)
CO2: 22 mmol/L (ref 20–29)
Creatinine, Ser: 1.01 mg/dL (ref 0.76–1.27)
GFR calc non Af Amer: 81 mL/min/{1.73_m2} (ref >59)
GFR, EST AFRICAN AMERICAN: 94 mL/min/{1.73_m2} (ref >59)
GLUCOSE: 112 mg/dL — AB (ref 65–99)
Potassium: 4.6 mmol/L (ref 3.5–5.2)
Sodium: 138 mmol/L (ref 134–144)

## 2017-07-21 LAB — CBC WITH DIFFERENTIAL/PLATELET
BASOS ABS: 0 10*3/uL (ref 0.0–0.2)
Basos: 0 %
EOS (ABSOLUTE): 0.3 10*3/uL (ref 0.0–0.4)
Eos: 5 %
HEMOGLOBIN: 15.2 g/dL (ref 13.0–17.7)
Hematocrit: 44.3 % (ref 37.5–51.0)
IMMATURE GRANS (ABS): 0 10*3/uL (ref 0.0–0.1)
Immature Granulocytes: 0 %
LYMPHS ABS: 2.8 10*3/uL (ref 0.7–3.1)
LYMPHS: 38 %
MCH: 32.2 pg (ref 26.6–33.0)
MCHC: 34.3 g/dL (ref 31.5–35.7)
MCV: 94 fL (ref 79–97)
MONOCYTES: 10 %
Monocytes Absolute: 0.8 10*3/uL (ref 0.1–0.9)
NEUTROS ABS: 3.6 10*3/uL (ref 1.4–7.0)
Neutrophils: 47 %
PLATELETS: 130 10*3/uL — AB (ref 150–379)
RBC: 4.72 x10E6/uL (ref 4.14–5.80)
RDW: 14.2 % (ref 12.3–15.4)
WBC: 7.5 10*3/uL (ref 3.4–10.8)

## 2017-07-21 NOTE — Progress Notes (Signed)
Electrophysiology Office Note   Date:  07/21/2017   ID:  Ryan Durham, DOB [birth year], MRN [id number]  PCP:  Patient, No Pcp Per  Cardiologist:  Shirlee Latch Primary Electrophysiologist:  Licet Dunphy Jorja Loa, MD    Chief Complaint  Patient presents with  . Advice Only    Discuss ICD/Chronic systolic HF/Ischemic cardiomyopathy     History of Present Illness: Ryan Durham is a 62 y.o. male who is being seen today for the evaluation of CHF at the request of Marca Ancona. Presenting today for electrophysiology evaluation.  Is a history of coronary artery disease status post CABG 03/2017, systolic heart failure secondary to ischemic cardiomyopathy, atrial fibrillation in the setting of STEMI, and tobacco abuse.  In January 2019 he had a sudden onset of chest tightness and shortness of breath.  He was noted to be in rapid atrial fibrillation.  Left heart catheterization showed critical three-vessel disease.  He had a 5 vessel CABG.  At this point, he walks between 30 minutes, 1/2 miles on most days on his treadmill.  He walks his dog.  No significant chest pain or dyspnea.  An echo showed an EF of 30 to 35%.    Today, he denies symptoms of palpitations, chest pain, shortness of breath, orthopnea, PND, lower extremity edema, claudication, dizziness, presyncope, syncope, bleeding, or neurologic sequela. The patient is tolerating medications without difficulties.  Feels that he has significantly improved since his cardiac operation.  He is having less shortness of breath.   Past Medical History:  Diagnosis Date  . Acute on chronic systolic heart failure (HCC) 03/29/2017  . Anginal pain (HCC)    this admission  . Cardiomyopathy, ischemic 03/30/2017  . Coronary artery disease    this admission  . Coronary artery disease involving native coronary artery of native heart with unstable angina pectoris (HCC) 03/29/2017  . Dyspnea    this admission  . Dysrhythmia    afib with this admission/runs of  VTACH/Afib both with this admission  . Hyperlipidemia   . Myocardial infarction [facility])    this admission  . Paroxysmal atrial fibrillation (HCC) 03/29/2017  . Tobacco abuse   . Type II diabetes mellitus (HCC) 03/30/2017   Past Surgical History:  Procedure Laterality Date  . CLIPPING OF ATRIAL APPENDAGE Left 03/31/2017   Procedure: CLIPPING OF ATRIAL APPENDAGE;  Surgeon: Purcell Nails, MD;  Location: [facility] OR;  Service: Open Heart Surgery;  Laterality: Left;  . CORONARY ANGIOPLASTY  03/30/2017  . CORONARY ARTERY BYPASS GRAFT N/A 03/31/2017   Procedure: CORONARY ARTERY BYPASS GRAFTING (CABG) time 5 using bilateral saphaneous vien, harvested endoscopicly and right internal mammary artery.;  Surgeon: Purcell Nails, MD;  Location[facility] OR;  Service: Open Heart Surgery;  Laterality: N/A;  . CORONARY/GRAFT ACUTE MI REVASCULARIZATION N/A 03/29/2017   Procedure: Coronary/Graft Acute MI Revascularization;  Surgeon: Kathleene Hazel, MD;  Location: [facility] INVASIVE CV LAB;  Service: Cardiovascular;  Laterality: N/A;  . IABP INSERTION N/A 03/29/2017   Procedure: IABP Insertion;  Surgeon: Kathleene Hazel, MD;  Location: [facility] INVASIVE CV LAB;  Service: Cardiovascular;  Laterality: N/A;  . LEFT HEART CATH AND CORONARY ANGIOGRAPHY N/A 03/29/2017   Procedure: LEFT HEART CATH AND CORONARY ANGIOGRAPHY;  Surgeon: Kathleene Hazel, MD;  Location: [facility] INVASIVE CV LAB;  Service: Cardiovascular;  Laterality: N/A;  . TEE WITHOUT CARDIOVERSION N/A 03/31/2017   Procedure: TRANSESOPHAGEAL ECHOCARDIOGRAM (TEE);  Surgeon: Purcell Nails, MD;  Location: [facility] OR;  Service: Open Heart Surgery;  Laterality:  N/A;     Current Outpatient Medications  Medication Sig Dispense Refill  . aspirin EC 81 MG EC tablet Take 1 tablet (81 mg total) by mouth daily.    [name] atorvastatin (LIPITOR) 80 MG tablet Take 1 tablet (80 mg total) by mouth daily at 6 PM. 30 tablet 5  . carvedilol (COREG) 3.125 MG tablet Take 2 tablets (6.25 mg total)  by mouth 2 (two) times daily with a meal. 120 tablet 5  . clopidogrel (PLAVIX) 75 MG tablet Take 1 tablet (75 mg total) by mouth daily. 30 tablet 5  . sacubitril-valsartan (ENTRESTO) 24-26 MG Take 1 tablet by mouth 2 (two) times daily. 60 tablet 11  . spironolactone (ALDACTONE) 25 MG tablet Take 1 tablet (25 mg total) by mouth daily. 30 tablet 5   No current facility-administered medications for this visit.     Allergies:   Patient has no known allergies.   Social History:  The patient  reports that he has quit smoking. His smoking use included cigarettes. He has a 30.00 pack-year smoking history. He has never used smokeless tobacco. He reports that he does not drink alcohol or use drugs.   Family History:  The patient's family history includes Cirrhosis in his mother; Heart attack in his sister; Myocarditis in his brother.    ROS:  Please see the history of present illness.   Otherwise, review of systems is positive for none.   All other systems are reviewed and negative.    PHYSICAL EXAM: VS:  BP (!) 96/56   Pulse 67   Ht  (1.778 m)   Wt 215 lb 3.2 oz (97.6 kg)   SpO2 97%   BMI 30.88 kg/m  , BMI Body mass index is 30.88 kg/m. GEN: Well nourished, well developed, in no acute distress  HEENT: normal  Neck: no JVD, carotid bruits, or masses Cardiac: RRR; no murmurs, rubs, or gallops,no edema  Respiratory:  clear to auscultation bilaterally, normal work of breathing GI: soft, nontender, nondistended, + BS MS: no deformity or atrophy  Skin: warm and dry Neuro:  Strength and sensation are intact Psych: euthymic mood, full affect  EKG:  EKG is not ordered today. Personal review of the ekg ordered 04/01/17 shows SR, rate 77, anteroseptal infarct  Recent Labs: 03/29/2017: TSH 4.454 03/30/2017: B Natriuretic Peptide 617.3 04/01/2017: Magnesium 2.2 04/03/2017: ALT 137 04/09/2017: Hemoglobin 11.0; Platelets 239 06/17/2017: BUN 14; Creatinine, Ser 1.02; Potassium 4.3; Sodium 138     Lipid Panel     Component Value Date/Time   CHOL 106 06/17/2017 1239   TRIG 119 06/17/2017 1239   HDL 26 (L) 06/17/2017 1239   CHOLHDL 4.1 06/17/2017 1239   VLDL 24 06/17/2017 1239   LDLCALC 56 06/17/2017 1239     Wt Readings from Last 3 Encounters:  07/21/17 215 lb 3.2 oz (97.6 kg)  07/20/17 216 lb (98 kg)  07/06/17 203 lb (92.1 kg)      Other studies Reviewed: Additional studies/ records that were reviewed today include: TTE 07/20/17  Review of the above records today demonstrates:  - Left ventricle: The cavity size was normal. Wall thickness was   normal. Systolic function was moderately to severely reduced. The   estimated ejection fraction was in the range of 30% to 35%.   Anteroseptal/inferoseptal, anterior, and inferolateral severe   hypokinesis. Features are consistent with a pseudonormal left   ventricular filling pattern, with concomitant abnormal relaxation   and increased filling pressure (grade 2 diastolic dysfunction). -  Aortic valve: There was no stenosis. - Aorta: Borderline dilated aortic root. Aortic root dimension: 37   mm (ED). - Mitral valve: There was no significant regurgitation. - Right ventricle: The cavity size was normal. Systolic function   was normal. - Pulmonary arteries: No complete TR doppler jet so unable to   estimate PA systolic pressure. - Inferior vena cava: The vessel was normal in size. The   respirophasic diameter changes were in the normal range (>= 50%),   consistent with normal central venous pressure. - Pericardium, extracardiac: A trivial pericardial effusion was   identified.  LHC 03/30/17  Prox RCA lesion is 80% stenosed.  Prox RCA to Dist RCA lesion is 30% stenosed.  Ost RPDA lesion is 99% stenosed.  Post Atrio lesion is 80% stenosed.  Dist LM to Ost LAD lesion is 100% stenosed.  Ost 1st Mrg lesion is 100% stenosed.  Ost Cx to Prox Cx lesion is 99% stenosed.  Ost 2nd Mrg to 2nd Mrg lesion is 70% stenosed.    1. Severe triple vessel CAD  2.  The LAD is not visualized on injections of the left system. The proximal LAD appears to be chronically occluded. The distal LAD and a diagonal branch fills from right to left collaterals.  3. The Circumflex has a 99% proximal stenosis at the takeoff of a moderate caliber OM branch which fills slowly. The second OM branch has a moderately severe stenosis.  4. The RCA is a large dominant vessel with high grade disease in the proximal vessel, PDA and PLA.  5. Elevated filling pressures.    ASSESSMENT AND PLAN:  1.  Systolic heart failure due to ischemic cardiomyopathy: EF 30 to 35%.  Is on optimal medical therapy with carvedilol, Entresto, and Aldactone.  He has a narrow QRS and thus is not a candidate for CRT.  Risks and benefits of ICD implant were discussed.  Risks include bleeding, tamponade, infection, pneumothorax.  He understands these risks and is agreed to the procedure.  2.  Coronary artery disease status post CABG: No current chest pain.  Currently on aspirin, Plavix, statin.  3.  Atrial fibrillation, paroxysmal: Noted in the admission of STEMI.  If recurs Drina Jobst need anticoagulation.  4.  Tobacco abuse: Has not smoked since January.    Current medicines are reviewed at length with the patient today.   The patient does not have concerns regarding his medicines.  The following changes were made today:  none  Labs/ tests ordered today include:  Orders Placed This Encounter  Procedures  . Basic Metabolic Panel (BMET)  . CBC w/Diff   Case discussed with primary cardiology  Disposition:   FU with Sundus Pete 3 months  Signed, Aristide Waggle Jorja Loa, MD  07/21/2017 10:12 AM     [facility] [address] [place] [address] [phone] (office) [phone] (fax)

## 2017-07-21 NOTE — Patient Instructions (Addendum)
Medication Instructions:  Your physician recommends that you continue on your current medications as directed. Please refer to the Current Medication list given to you today.     * If you need a refill on your cardiac medications before your next appointment, please call your pharmacy. *   Labwork: Pre procedure lab work today: BMET & CBC w/ diff * Will notify you of abnormal results, otherwise continue current treatment plan.*   Testing/Procedures: Your physician has recommended that you have a defibrillator inserted. An implantable cardioverter defibrillator (ICD) is a small device that is placed in your chest or, in rare cases, your abdomen. This device uses electrical pulses or shocks to help control life-threatening, irregular heartbeats that could lead the heart to suddenly stop beating (sudden cardiac arrest). Leads are attached to the ICD that goes into your heart. This is done in the hospital and usually requires an overnight stay. Please follow the instructions below, located under the special instructions section.   Follow-Up: Your physician recommends that you schedule a wound check appointment 10-14 days, after your procedure on 07/23/17, with the device clinic.  Your physician recommends that you schedule a follow up appointment in 91 days, after your procedure on 07/23/17, with Dr. Elberta Fortis.  * Please note that any paperwork needing to be filled out by the provider will need to be addressed at the front desk prior to seeing the provider.  Please note that any FMLA, disability or other documents regarding health condition is subject to a $25.00 charge that must be received prior to completion of paperwork in the form of a money order or check. *  Thank you for choosing [facility]!!   Dory Horn, RN [phone]   Any Other Special Instructions Will Be Listed Below (If Applicable).     Implantable Device Instructions  You are scheduled for:                  _____  Implantable Cardioverter Defibrillator  on  07/23/17  with Dr. Elberta Fortis.  1.   Please arrive at the [facility], Entrance "A"  at [facility] at  12:00 pm on the day of your procedure. (The address is [address])  2. Do not eat or drink after midnight the night before your procedure.  3.   Complete pre procedure  lab work on 07/21/17.  The lab at [facility] is open from 8:00 AM to 4:30 PM.  You do not have to be fasting.  4.   A) -Hold the following medications the morning of your procedure: Spironolactone           - All of your remaining medications may be taken with a small amount of water the morning of your procedure.  5.  Plan for an overnight stay.  Bring your insurance cards and a list of you medications.  6.  Wash your chest and neck with surgical scrub the evening before and the morning of  your procedure.  Rinse well. Please review the surgical scrub instruction sheet given to you.  7. Your chest will need to be shaved prior to this procedure (if needed). We ask that you do this yourself at home 1 to 2 days before or if uncomfortable/unable to do yourself, then it will be performed by the hospital staff the day of.                                                                                                                *  If you have ANY questions after you get home, please call Dory Horn, RN @ [phone].  * Every attempt is made to prevent procedures from being rescheduled.  Due to the nature of  Electrophysiology, rescheduling can happen.  The physician is always aware and directs the staff when this occurs.   [place] - Preparing For Surgery - surgical scrub  Before surgery, you can play an important role. Because skin is not sterile, your skin needs to be as free of germs as possible. You can reduce the number of germs on your skin by washing with CHG (chlorahexidine gluconate) Soap before surgery.  CHG is an antiseptic cleaner  which kills germs and bonds with the skin to continue killing germs even after washing.   Please do not use if you have an allergy to CHG or antibacterial soaps.  If your skin becomes reddened/irritated stop using the CHG.   Do not shave (including legs and underarms) for at least 48 hours prior to first CHG shower.  It is OK to shave your face.  Please follow these instructions carefully:  1.  Shower the night before surgery and the morning of surgery with CHG.  2.  If you chose to wash your hair, wash your hair first as usual with your normal shampoo.  3.  After you shampoo, rinse your hair and body thoroughly to remove the shampoo.  4.  Use CHG as you would any other liquid soap.  You can apply CHG directly to the skin and was gently with a clean washcloth. 5.  Apply the CHG Soap to your body ONLY FROM THE NECK DOWN.  Do not use on open wounds or open sores.  Avoid contact with your eyes, ears, moth and genitals          (private parts).  Wash genitals (private parts) with your normal soap.  6.  Wash thoroughly, paying special attention to the area where your surgery will be performed.  7.  Thoroughly rise your body with warm water from the next down.   8.  DO NOT shower/wash with your normal soap after using and rinsing off the CHG soap.  9.  Pat yourself dry with a clean towel.           10.  Wear clean pajamas.           11.  Place clean sheets on your bed the night of your first shower and do not sleep with pets.  Day of Surgery: Do not apply any deodorants/lotions.  Please wear clean clothes to the hospital/surgery center.    Cardioverter Defibrillator Implantation An implantable cardioverter defibrillator (ICD) is a small, lightweight, battery-powered device that is placed (implanted) under the skin in the chest or abdomen. Your caregiver may prescribe an ICD if:  You have had an irregular heart rhythm (arrhythmia) that originated in the lower chambers of the heart  (ventricles).  Your heart has been damaged by a disease (such as coronary artery disease) or heart condition (such as a heart attack). An ICD consists of a battery that lasts several years, a small computer called a pulse generator, and wires called leads that go into the heart. It is used to detect and correct two dangerous arrhythmias: a rapid heart rhythm (tachycardia) and an arrhythmia in which the ventricles contract in an uncoordinated way (fibrillation). When an ICD detects tachycardia, it sends an electrical signal to the heart that restores the heartbeat to normal (cardioversion). This signal is  usually painless. If cardioversion does not work or if the ICD detects fibrillation, it delivers a small electrical shock to the heart (defibrillation) to restart the heart. The shock may feel like a strong jolt in the chest. ICDs may be programmed to correct other problems. Sometimes, ICDs are programmed to act as another type of implantable device called a pacemaker. Pacemakers are used to treat a slow heartbeat (bradycardia). LET YOUR CAREGIVER KNOW ABOUT:  Any allergies you have.  All medicines you are taking, including vitamins, herbs, eyedrops, and over-the-counter medicines and creams.  Previous problems you or members of your family have had with the use of anesthetics.  Any blood disorders you have had.  Other health problems you have. RISKS AND COMPLICATIONS Generally, the procedure to implant an ICD is safe. However, as with any surgical procedure, complications can occur. Possible complications associated with implanting an ICD include:  Swelling, bleeding, or bruising at the site where the ICD was implanted.  Infection at the site where the ICD was implanted.  A reaction to medicine used during the procedure.  Nerve, heart, or blood vessel damage.  Blood clots. BEFORE THE PROCEDURE  You may need to have blood tests, heart tests, or a chest X-ray done before the day of the  procedure.  Ask your caregiver about changing or stopping your regular medicines.  Make plans to have someone drive you home. You may need to stay in the hospital overnight after the procedure.  Stop smoking at least 24 hours before the procedure.  Take a bath or shower the night before the procedure. You may need to scrub your chest or abdomen with a special type of soap.  Do not eat or drink before your procedure for as long as directed by your caregiver. Ask if it is okay to take any needed medicine with a small sip of water. PROCEDURE  The procedure to implant an ICD in your chest or abdomen is usually done at a hospital in a room that has a large X-ray machine called a fluoroscope. The machine will be above you during the procedure. It will help your caregiver see your heart during the procedure. Implanting an ICD usually takes 1-3 hours. Before the procedure:   Small monitors will be put on your body. They will be used to check your heart, blood pressure, and oxygen level.  A needle will be put into a vein in your hand or arm. This is called an intravenous (IV) access tube. Fluids and medicine will flow directly into your body through the IV tube.  Your chest or abdomen will be cleaned with a germ-killing (antiseptic) solution. The area may be shaved.  You may be given medicine to help you relax (sedative).  You will be given a medicine called a local anesthetic. This medicine will make the surgical site numb while the ICD is implanted. You will be sleepy but awake during the procedure. After you are numb the procedure will begin. The caregiver will:  Make a small cut (incision). This will make a pocket deep under your skin that will hold the pulse generator.  Guide the leads through a large blood vessel into your heart and attach them to the heart muscles. Depending on the ICD, the leads may go into one ventricle or they may go to both ventricles and into an upper chamber of the  heart (atrium).  Test the ICD.  Close the incision with stitches, glue, or staples. AFTER THE PROCEDURE  You may  feel pain. Some pain is normal. It may last a few days.  You may stay in a recovery area until the local anesthetic has worn off. Your blood pressure and pulse will be checked often. You will be taken to a room where your heart will be monitored.  A chest X-ray will be taken. This is done to check that the cardioverter defibrillator is in the right place.  You may stay in the hospital overnight.  A slight bump may be seen over the skin where the ICD was placed. Sometimes, it is possible to feel the ICD under the skin. This is normal.  In the months and years afterward, your caregiver will check the device, the leads, and the battery every few months. Eventually, when the battery is low, the ICD will be replaced.   This information is not intended to replace advice given to you by your health care provider. Make sure you discuss any questions you have with your health care provider.   Document Released: 11/30/2001 Document Revised: 12/29/2012 Document Reviewed: 03/29/2012 Elsevier Interactive Patient Education 2016 [employer]    Cardioverter Defibrillator Implantation, Care After This sheet gives you information about how to care for yourself after your procedure. Your health care provider may also give you more specific instructions. If you have problems or questions, contact your health care provider. What can I expect after the procedure? After the procedure, it is common to have:  Some pain. It may last a few days.  A slight bump over the skin where the device was placed. Sometimes, it is possible to feel the device under the skin. This is normal.  During the months and years after your procedure, your health care provider will check the device, the leads, and the battery every few months. Eventually, when the battery is low, the device will be replaced. Follow  these instructions at home: Medicines  Take over-the-counter and prescription medicines only as told by your health care provider.  If you were prescribed an antibiotic medicine, take it as told by your health care provider. Do not stop taking the antibiotic even if you start to feel better. Incision care   Follow instructions from your health care provider about how to take care of your incision area. Make sure you: ? Wash your hands with soap and water before you change your bandage (dressing). If soap and water are not available, use hand sanitizer. ? Change your dressing as told by your health care provider. ? Leave stitches (sutures), skin glue, or adhesive strips in place. These skin closures may need to stay in place for 2 weeks or longer. If adhesive strip edges start to loosen and curl up, you may trim the loose edges. Do not remove adhesive strips completely unless your health care provider tells you to do that.  Check your incision area every day for signs of infection. Check for: ? More redness, swelling, or pain. ? More fluid or blood. ? Warmth. ? Pus or a bad smell.  Do not use lotions or ointments near the incision area unless told by your health care provider.  Keep the incision area clean and dry for 2-3 days after the procedure or for as long as told by your health care provider. It takes several weeks for the incision site to heal completely.  Do not take baths, swim, or use a hot tub until your health care provider approves. Activity  Try to walk a little every day. Exercising is important after  this procedure. Also, use your shoulder on the side of the defibrillator in daily tasks that do not require a lot of motion.  For at least 6 weeks: ? Do not lift your upper arm above your shoulders. This means no tennis, golf, or swimming for this period of time. If you tend to sleep with your arm above your head, use a restraint to prevent this during sleep. ? Avoid sudden  jerking, pulling, or chopping movements that pull your upper arm far away from your body.  Ask your health care provider when you may go back to work.  Check with your health care provider before you start to drive or play sports. Electric and magnetic fields  Tell all health care providers that you have a defibrillator. This may prevent them from giving you an MRI scan because strong magnets are used for that test.  If you must pass through a metal detector, quickly walk through it. Do not stop under the detector, and do not stand near it.  Avoid places or objects that have a strong electric or magnetic field, including: ? [employer]. At the airport, let officials know that you have a defibrillator. Your defibrillator ID card will let you be checked in a way that is safe for you and will not damage your defibrillator. Also, do not let a security person wave a magnetic wand near your defibrillator. That can make it stop working. ? Power plants. ? Large electrical generators. ? Anti-theft systems or electronic article surveillance (EAS). ? Radiofrequency transmission towers, such as cell phone and radio towers.  Do not use amateur (ham) radio equipment or electric (arc) welding torches. Some devices are safe to use if held at least 12 inches (30 cm) from your defibrillator. These include power tools, lawn mowers, and speakers. If you are unsure if something is safe to use, ask your health care provider.  Do not use MP3 player headphones. They have magnets.  You may safely use electric blankets, heating pads, computers, and microwave ovens.  When using your cell phone, hold it to the ear that is on the opposite side from the defibrillator. Do not leave your cell phone in a pocket over the defibrillator. General instructions  Follow diet instructions from your health care provider, if this applies.  Always keep your defibrillator ID card with you. The card should list the  implant date, device model, and manufacturer. Consider wearing a medical alert bracelet or necklace.  Have your defibrillator checked every 3-6 months or as often as told by your health care provider. Most defibrillators last for 4-8 years.  Keep all follow-up visits as told by your health care provider. This is important for your health care provider to make sure your chest is healing the way it should. Ask your health care provider when you should come back to have your stitches or staples taken out. Contact a health care provider if:  You feel one shock in your chest.  You gain weight suddenly.  Your legs or feet swell more than they have before.  It feels like your heart is fluttering or skipping beats (heart palpitations).  You have more redness, swelling, or pain around your incision.  You have more fluid or blood coming from your incision.  Your incision feels warm to the touch.  You have pus or a bad smell coming from your incision.  You have a fever. Get help right away if:  You have chest pain.  You feel  more than one shock.  You feel more short of breath than you have felt before.  You feel more light-headed than you have felt before.  Your incision starts to open up. This information is not intended to replace advice given to you by your health care provider. Make sure you discuss any questions you have with your health care provider. Document Released: 09/27/2004 Document Revised: 09/28/2015 Document Reviewed: 08/15/2015 Elsevier Interactive Patient Education  2018 [employer].     Supplemental Discharge Instructions for  Pacemaker/Defibrillator Patients  ACTIVITY No heavy lifting or vigorous activity with your left/right arm for 6 to 8 weeks.  Do not raise your left/right arm above your head for one week.  Gradually raise your affected arm as drawn below.           __  NO DRIVING for     ; you may begin driving on     .  WOUND CARE - Keep the  wound area clean and dry.  Do not get this area wet for one week. No showers for one week; you may shower on     . - The tape/steri-strips on your wound will fall off; do not pull them off.  No bandage is needed on the site.  DO  NOT apply any creams, oils, or ointments to the wound area. - If you notice any drainage or discharge from the wound, any swelling or bruising at the site, or you develop a fever > 101? F after you are discharged home, call the office at once.  SPECIAL INSTRUCTIONS - You are still able to use cellular telephones; use the ear opposite the side where you have your pacemaker/defibrillator.  Avoid carrying your cellular phone near your device. - When traveling through airports, show security personnel your identification card to avoid being screened in the metal detectors.  Ask the security personnel to use the hand wand. - Avoid arc welding equipment, MRI testing (magnetic resonance imaging), TENS units (transcutaneous nerve stimulators).  Call the office for questions about other devices. - Avoid electrical appliances that are in poor condition or are not properly grounded. - Microwave ovens are safe to be near or to operate.  ADDITIONAL INFORMATION FOR DEFIBRILLATOR PATIENTS SHOULD YOUR DEVICE GO OFF: - If your device goes off ONCE and you feel fine afterward, notify the device clinic nurses. - If your device goes off ONCE and you do not feel well afterward, call 911. - If your device goes off TWICE, call 911. - If your device goes off THREE TIMES IN ONE DAY, call 911.  DO NOT DRIVE YOURSELF OR A FAMILY MEMBER WITH A DEFIBRILLATOR TO THE HOSPITAL-CALL 911.

## 2017-07-23 ENCOUNTER — Encounter (HOSPITAL_COMMUNITY)
Admission: RE | Disposition: A | Discharge status: Home / Self Care | Payer: Self-pay | Source: Ambulatory Visit | Attending: Cardiology

## 2017-07-23 ENCOUNTER — Ambulatory Visit (HOSPITAL_COMMUNITY)
Admission: RE | Admit: 2017-07-23 | Discharge: 2017-07-24 | Disposition: A | Discharge status: Home / Self Care | Payer: 59 | Source: Ambulatory Visit | Attending: Cardiology | Admitting: Cardiology

## 2017-07-23 DIAGNOSIS — Z7902 Long term (current) use of antithrombotics/antiplatelets: Secondary | ICD-10-CM | POA: Diagnosis not present

## 2017-07-23 DIAGNOSIS — I480 Paroxysmal atrial fibrillation: Secondary | ICD-10-CM | POA: Insufficient documentation

## 2017-07-23 DIAGNOSIS — Z01818 Encounter for other preprocedural examination: Secondary | ICD-10-CM | POA: Insufficient documentation

## 2017-07-23 DIAGNOSIS — Z006 Encounter for examination for normal comparison and control in clinical research program: Secondary | ICD-10-CM | POA: Insufficient documentation

## 2017-07-23 DIAGNOSIS — I252 Old myocardial infarction: Secondary | ICD-10-CM | POA: Insufficient documentation

## 2017-07-23 DIAGNOSIS — I2510 Atherosclerotic heart disease of native coronary artery without angina pectoris: Secondary | ICD-10-CM | POA: Diagnosis not present

## 2017-07-23 DIAGNOSIS — Z7982 Long term (current) use of aspirin: Secondary | ICD-10-CM | POA: Insufficient documentation

## 2017-07-23 DIAGNOSIS — Z79899 Other long term (current) drug therapy: Secondary | ICD-10-CM | POA: Diagnosis not present

## 2017-07-23 DIAGNOSIS — I255 Ischemic cardiomyopathy: Secondary | ICD-10-CM | POA: Diagnosis not present

## 2017-07-23 DIAGNOSIS — I5022 Chronic systolic (congestive) heart failure: Secondary | ICD-10-CM | POA: Insufficient documentation

## 2017-07-23 DIAGNOSIS — Z95818 Presence of other cardiac implants and grafts: Secondary | ICD-10-CM

## 2017-07-23 HISTORY — PX: ICD IMPLANT: EP1208

## 2017-07-23 LAB — SURGICAL PCR SCREEN
MRSA, PCR: NEGATIVE
STAPHYLOCOCCUS AUREUS: NEGATIVE

## 2017-07-23 SURGERY — ICD IMPLANT

## 2017-07-23 MED ORDER — CLOPIDOGREL BISULFATE 75 MG PO TABS
75.0000 mg | ORAL_TABLET | Freq: Every day | ORAL | Status: DC
  Administered 2017-07-24: 75 mg via ORAL
  Filled 2017-07-23: qty 1

## 2017-07-23 MED ORDER — CEFAZOLIN SODIUM-DEXTROSE 2-4 GM/100ML-% IV SOLN
2.0000 g | INTRAVENOUS | Status: AC
  Administered 2017-07-23: 2 g via INTRAVENOUS
  Filled 2017-07-23: qty 100

## 2017-07-23 MED ORDER — MUPIROCIN 2 % EX OINT
1.0000 "application " | TOPICAL_OINTMENT | Freq: Once | CUTANEOUS | Status: AC
  Administered 2017-07-23: 1 via TOPICAL
  Filled 2017-07-23: qty 22

## 2017-07-23 MED ORDER — SPIRONOLACTONE 25 MG PO TABS
25.0000 mg | ORAL_TABLET | Freq: Every day | ORAL | Status: DC
  Administered 2017-07-23 – 2017-07-24 (×2): 25 mg via ORAL
  Filled 2017-07-23 (×2): qty 1

## 2017-07-23 MED ORDER — SODIUM CHLORIDE 0.9 % IV SOLN
INTRAVENOUS | Status: AC
  Filled 2017-07-23: qty 2

## 2017-07-23 MED ORDER — SODIUM CHLORIDE 0.9 % IV SOLN
80.0000 mg | INTRAVENOUS | Status: AC
  Administered 2017-07-23: 80 mg
  Filled 2017-07-23: qty 2

## 2017-07-23 MED ORDER — MUPIROCIN 2 % EX OINT
TOPICAL_OINTMENT | CUTANEOUS | Status: AC
  Administered 2017-07-23: 1 via TOPICAL
  Filled 2017-07-23: qty 22

## 2017-07-23 MED ORDER — FENTANYL CITRATE (PF) 100 MCG/2ML IJ SOLN
INTRAMUSCULAR | Status: DC | PRN
  Administered 2017-07-23: 25 ug via INTRAVENOUS
  Administered 2017-07-23: 12.5 ug via INTRAVENOUS

## 2017-07-23 MED ORDER — MIDAZOLAM HCL 5 MG/5ML IJ SOLN
INTRAMUSCULAR | Status: DC | PRN
  Administered 2017-07-23 (×2): 1 mg via INTRAVENOUS

## 2017-07-23 MED ORDER — ATORVASTATIN CALCIUM 80 MG PO TABS
80.0000 mg | ORAL_TABLET | Freq: Every day | ORAL | Status: DC
  Administered 2017-07-23: 80 mg via ORAL
  Filled 2017-07-23: qty 1

## 2017-07-23 MED ORDER — CARVEDILOL 6.25 MG PO TABS
6.2500 mg | ORAL_TABLET | Freq: Two times a day (BID) | ORAL | Status: DC
  Administered 2017-07-23 – 2017-07-24 (×2): 6.25 mg via ORAL
  Filled 2017-07-23 (×2): qty 1

## 2017-07-23 MED ORDER — ONDANSETRON HCL 4 MG/2ML IJ SOLN: 4.0000 mg | Freq: Four times a day (QID) | INTRAMUSCULAR | Status: DC | PRN

## 2017-07-23 MED ORDER — CEFAZOLIN SODIUM-DEXTROSE 2-4 GM/100ML-% IV SOLN
INTRAVENOUS | Status: AC
  Filled 2017-07-23: qty 100

## 2017-07-23 MED ORDER — FENTANYL CITRATE (PF) 100 MCG/2ML IJ SOLN
INTRAMUSCULAR | Status: AC
  Filled 2017-07-23: qty 2

## 2017-07-23 MED ORDER — LIDOCAINE HCL (PF) 1 % IJ SOLN
INTRAMUSCULAR | Status: AC
  Filled 2017-07-23: qty 30

## 2017-07-23 MED ORDER — HEPARIN (PORCINE) IN NACL 2-0.9 UNITS/ML
INTRAMUSCULAR | Status: AC | PRN
  Administered 2017-07-23: 500 mL

## 2017-07-23 MED ORDER — SODIUM CHLORIDE 0.9 % IV SOLN
INTRAVENOUS | Status: DC
  Administered 2017-07-23: 13:00:00 via INTRAVENOUS

## 2017-07-23 MED ORDER — ACETAMINOPHEN 325 MG PO TABS: 325.0000 mg | ORAL_TABLET | ORAL | Status: DC | PRN

## 2017-07-23 MED ORDER — CHLORHEXIDINE GLUCONATE 4 % EX LIQD
60.0000 mL | Freq: Once | CUTANEOUS | Status: DC
  Filled 2017-07-23: qty 60

## 2017-07-23 MED ORDER — ASPIRIN EC 81 MG PO TBEC
81.0000 mg | DELAYED_RELEASE_TABLET | Freq: Every day | ORAL | Status: DC
  Administered 2017-07-24: 81 mg via ORAL
  Filled 2017-07-23: qty 1

## 2017-07-23 MED ORDER — SACUBITRIL-VALSARTAN 24-26 MG PO TABS
1.0000 | ORAL_TABLET | Freq: Two times a day (BID) | ORAL | Status: DC
  Administered 2017-07-24 (×2): 1 via ORAL
  Filled 2017-07-23 (×2): qty 1

## 2017-07-23 MED ORDER — MIDAZOLAM HCL 5 MG/5ML IJ SOLN
INTRAMUSCULAR | Status: AC
  Filled 2017-07-23: qty 5

## 2017-07-23 MED ORDER — CEFAZOLIN SODIUM-DEXTROSE 1-4 GM/50ML-% IV SOLN
1.0000 g | Freq: Four times a day (QID) | INTRAVENOUS | Status: AC
  Administered 2017-07-23 – 2017-07-24 (×3): 1 g via INTRAVENOUS
  Filled 2017-07-23 (×3): qty 50

## 2017-07-23 MED ORDER — HEPARIN (PORCINE) IN NACL 1000-0.9 UT/500ML-% IV SOLN
INTRAVENOUS | Status: AC
  Filled 2017-07-23: qty 500

## 2017-07-23 MED ORDER — LIDOCAINE HCL (PF) 1 % IJ SOLN
INTRAMUSCULAR | Status: DC | PRN
Start: 2017-07-23 — End: 2017-07-23
  Administered 2017-07-23: 60 mL

## 2017-07-23 SURGICAL SUPPLY — 8 items
CABLE SURGICAL S-101-97-12 (CABLE) ×3 IMPLANT
COVER DOME SNAP 22 D (MISCELLANEOUS) ×3 IMPLANT
ICD VISIA MRI VR DVFB1D4 (ICD Generator) ×1 IMPLANT
LEAD SPRINT QUAT SEC 6935M-62 (Lead) ×3 IMPLANT
PAD DEFIB LIFELINK (PAD) ×3 IMPLANT
SHEATH CLASSIC 9F (SHEATH) ×3 IMPLANT
TRAY PACEMAKER INSERTION (PACKS) ×3 IMPLANT
VISIA MRI VR DVFB1D4 (ICD Generator) ×3 IMPLANT

## 2017-07-23 NOTE — H&P (Signed)
Ryan Durham has presented today for surgery, with the diagnosis of ischemic cardiomyopathy.  The various methods of treatment have been discussed with the patient and family. After consideration of risks, benefits and other options for treatment, the patient has consented to  Procedure(s): ICD implant as a surgical intervention .  Risks include but not limited to bleeding, tamponade, infection, pneumothorax, among others. The patient's history has been reviewed, patient examined, no change in status, stable for surgery.  I have reviewed the patient's chart and labs.  Questions were answered to the patient's satisfaction.    Merline Perkin Elberta Fortis, MD 07/23/2017 12:12 PM  ICD Criteria  Current LVEF:30-35%. Within 12 months prior to implant: Yes   Heart failure history: Yes, Class II  Cardiomyopathy history: Yes, Ischemic Cardiomyopathy - Prior MI.  Atrial Fibrillation/Atrial Flutter: Yes, Paroxysmal.  Ventricular tachycardia history: No.  Cardiac arrest history: No.  History of syndromes with risk of sudden death: No.  Previous ICD: No.  Current ICD indication: Primary  PPM indication: No.   Class I or II Bradycardia indication present: No  Beta Blocker therapy for 3 or more months: Yes, prescribed.   Ace Inhibitor/ARB therapy for 3 or more months: Yes, prescribed.

## 2017-07-23 NOTE — Discharge Summary (Addendum)
ELECTROPHYSIOLOGY PROCEDURE DISCHARGE SUMMARY    Patient ID: Ryan Durham,  MRN: [id number], DOB/AGE: Feb 10, 1956 62 y.o.  Admit date: 07/23/2017 Discharge date: 07/24/17  Primary Care Physician: Patient, No Pcp Per  Primary Cardiologist: Dr. Shirlee Latch Electrophysiologist: Dr. Elberta Fortis  Primary Discharge Diagnosis:  1. ICM  Secondary Discharge Diagnosis:  1. CAD 2. Chronic CHF (systolic)   No Known Allergies   Procedures This Admission:  1.  Implantation of a MDT single chamber ICD on 07/23/17 by Dr Elberta Fortis.  The patient received a Medtronic Visia AF MRI SureScan (serial  Number [id number]) ICD,Medtronic, model [id number] (serial number [id number]) right ventricular defibrillator lead  DFT's were deferred at time of implant.   There were no immediate post procedure complications. 2.  CXR on 07/24/17 demonstrated no pneumothorax status post device implantation.   Brief HPI: Ryan Durham is a 62 y.o. male was referred to electrophysiology in the outpatient setting for consideration of ICD implantation.  Past medical history is noted above.  The patient has persistent LV dysfunction despite guideline directed therapy.  Risks, benefits, and alternatives to ICD implantation were reviewed with the patient who wished to proceed.   Hospital Course:  The patient was admitted and underwent implantation of an ICD with details as outlined above. He was monitored on telemetry overnight which demonstrated SR.  Left chest was without hematoma or ecchymosis.  The device was interrogated and found to be functioning normally.  CXR was obtained and demonstrated no pneumothorax status post device implantation.  Wound care, arm mobility, and restrictions were reviewed with the patient.  The patient this morning feels well, no c/o CP or SOB, he was examined by Dr. Elberta Fortis and considered stable for discharge to home.   The patient's discharge medications include an ARB (Entresto) and beta blocker  (carvedilol).   Physical Exam: Vitals:   07/23/17 1627 07/23/17 1951 07/24/17 0011 07/24/17 0551  BP: 98/60 (!) 98/58 (!) 93/55 100/63  Pulse:  67 65 65  Resp: Temp: 97.9 F (36.6 C) 98.2 F (36.8 C) 98.2 F (36.8 C) 98 F (36.7 C)  TempSrc: Oral Oral Oral Oral  SpO2: 98% 96% 97% 94%  Weight: 212 lb (96.2 kg)   212 lb 4.8 oz (96.3 kg)  Height:  (1.778 m)       GEN- The patient is well appearing, alert and oriented x 3 today.   HEENT: normocephalic, atraumatic; sclera clear, conjunctiva pink; hearing intact; oropharynx clear Lungs- CTA b/l, normal work of breathing.  No wheezes, rales, rhonchi Heart- RRR, no murmurs, rubs or gallops, PMI not laterally displaced GI- soft, non-tender, non-distended Extremities- no clubbing, cyanosis, or edema MS- no significant deformity or atrophy Skin- warm and dry, no rash or lesion, left chest without hematoma/ecchymosis Psych- euthymic mood, full affect Neuro- no gross defecits  Labs:   Lab Results  Component Value Date   WBC 7.5 07/21/2017   HGB 15.2 07/21/2017   HCT 44.3 07/21/2017   MCV 94 07/21/2017   PLT 130 (L) 07/21/2017    Recent Labs  Lab 07/21/17 1053  NA 138  K 4.6  CL 102  CO2 22  BUN 21  CREATININE 1.01  CALCIUM 9.4  GLUCOSE 112*    Discharge Medications:  Allergies as of 07/24/2017   No Known Allergies     Medication List    TAKE these medications   aspirin 81 MG EC tablet Take 1 tablet (81 mg total) by mouth daily.  atorvastatin 80 MG tablet Commonly known as:  LIPITOR Take 1 tablet (80 mg total) by mouth daily at 6 PM.   carvedilol 3.125 MG tablet Commonly known as:  COREG Take 2 tablets (6.25 mg total) by mouth 2 (two) times daily with a meal.   clopidogrel 75 MG tablet Commonly known as:  PLAVIX Take 1 tablet (75 mg total) by mouth daily.   sacubitril-valsartan 24-26 MG Commonly known as:  ENTRESTO Take 1 tablet by mouth 2 (two) times daily.   spironolactone 25 MG  tablet Commonly known as:  ALDACTONE Take 1 tablet (25 mg total) by mouth daily.       Disposition:  Home  Discharge Instructions    Diet - low sodium heart healthy   Complete by:  As directed    Increase activity slowly   Complete by:  As directed      Follow-up Information    [facility] Office Follow up on 08/06/2017.   Specialty:  Cardiology Why:  10:00AM, wound check visit Contact information: [address] [place] [address] [phone]       Regan Lemming, MD Follow up on 10/27/2017.   Specialty:  Cardiology Why:  9:30AM Contact information: [address] [place] [address] [phone]        Laurey Morale, MD Follow up on 10/20/2017.   Specialty:  Cardiology Why:  11:00AM Contact information: [address] [place] [address] [phone]           Duration of Discharge Encounter: Greater than 30 minutes including physician time.  Norma Fredrickson, [name] 07/24/2017 9:44 AM    I have seen and examined this patient with Francis Dowse.  Agree with above, note added to reflect my findings.  On exam, RRR, no murmurs, lungs clear. Medtronic ICD implanted for ischemic cardiomyopathy. CXR and interrogation without issue. Plan for discharge with follow up in device clinic.    Dewitte Vannice M. Tyronza Happe MD 07/24/2017 9:57 AM

## 2017-07-23 NOTE — Discharge Instructions (Signed)
° ° °  Supplemental Discharge Instructions for  °Pacemaker/Defibrillator Patients ° °Activity °No heavy lifting or vigorous activity with your left/right arm for 6 to 8 weeks.  Do not raise your left/right arm above your head for one week.  Gradually raise your affected arm as drawn below. ° °        °    07/26/17                      07/27/17                      07/28/17                     07/29/17 °__ ° °NO DRIVING for  1 week   ; you may begin driving on   07/29/17 . ° °WOUND CARE °- Keep the wound area clean and dry.  Do not get this area wet, no showers until cleared to at your wound check visit . °- The tape/steri-strips on your wound will fall off; do not pull them off.  No bandage is needed on the site.  DO  NOT apply any creams, oils, or ointments to the wound area. °- If you notice any drainage or discharge from the wound, any swelling or bruising at the site, or you develop a fever > 101? F after you are discharged home, call the office at once. ° °Special Instructions °- You are still able to use cellular telephones; use the ear opposite the side where you have your pacemaker/defibrillator.  Avoid carrying your cellular phone near your device. °- When traveling through airports, show security personnel your identification card to avoid being screened in the metal detectors.  Ask the security personnel to use the hand wand. °- Avoid arc welding equipment, MRI testing (magnetic resonance imaging), TENS units (transcutaneous nerve stimulators).  Call the office for questions about other devices. °- Avoid electrical appliances that are in poor condition or are not properly grounded. °- Microwave ovens are safe to be near or to operate. ° °Additional information for defibrillator patients should your device go off: °- If your device goes off ONCE and you feel fine afterward, notify the device clinic nurses. °- If your device goes off ONCE and you do not feel well afterward, call 911. °- If your device goes off TWICE,  call 911. °- If your device goes off THREE times in one day, call 911. ° °DO NOT DRIVE YOURSELF OR A FAMILY MEMBER °WITH A DEFIBRILLATOR TO THE HOSPITAL--CALL 911. ° °

## 2017-07-24 ENCOUNTER — Encounter (HOSPITAL_COMMUNITY): Payer: Self-pay | Admitting: Cardiology

## 2017-07-24 ENCOUNTER — Ambulatory Visit (HOSPITAL_COMMUNITY): Payer: 59

## 2017-07-24 DIAGNOSIS — I5022 Chronic systolic (congestive) heart failure: Secondary | ICD-10-CM | POA: Diagnosis not present

## 2017-07-24 DIAGNOSIS — I2510 Atherosclerotic heart disease of native coronary artery without angina pectoris: Secondary | ICD-10-CM | POA: Diagnosis not present

## 2017-07-24 DIAGNOSIS — Z006 Encounter for examination for normal comparison and control in clinical research program: Secondary | ICD-10-CM | POA: Diagnosis not present

## 2017-07-24 DIAGNOSIS — I255 Ischemic cardiomyopathy: Secondary | ICD-10-CM | POA: Diagnosis not present

## 2017-07-24 NOTE — Progress Notes (Signed)
Pt up in chair IV and tele roomed d/c papers given. Waiting on ride.

## 2017-07-24 NOTE — Progress Notes (Signed)
Patient is alert and oriented with no complaints discharged to go home stand that there daughter will pick up in 1hour.

## 2017-08-06 ENCOUNTER — Ambulatory Visit (INDEPENDENT_AMBULATORY_CARE_PROVIDER_SITE_OTHER): Payer: 59 | Admitting: *Deleted

## 2017-08-06 DIAGNOSIS — I255 Ischemic cardiomyopathy: Secondary | ICD-10-CM

## 2017-08-06 LAB — CUP PACEART INCLINIC DEVICE CHECK
Battery Remaining Longevity: 130 mo
HIGH POWER IMPEDANCE MEASURED VALUE: 60 Ohm
Implantable Lead Implant Date: 20190502
Implantable Lead Location: 753860
Implantable Pulse Generator Implant Date: 20190502
Lead Channel Pacing Threshold Amplitude: 0.75 V
Lead Channel Pacing Threshold Pulse Width: 0.4 ms
Lead Channel Sensing Intrinsic Amplitude: 12.125 mV
Lead Channel Setting Pacing Pulse Width: 0.4 ms
Lead Channel Setting Sensing Sensitivity: 0.3 mV
MDC IDC MSMT BATTERY VOLTAGE: 3.02 V
MDC IDC MSMT LEADCHNL RV IMPEDANCE VALUE: 380 Ohm
MDC IDC MSMT LEADCHNL RV IMPEDANCE VALUE: 456 Ohm
MDC IDC MSMT LEADCHNL RV SENSING INTR AMPL: 12 mV
MDC IDC SESS DTM: 20190516105309
MDC IDC SET LEADCHNL RV PACING AMPLITUDE: 3.5 V
MDC IDC STAT BRADY RV PERCENT PACED: 0.01 %

## 2017-08-06 NOTE — Progress Notes (Signed)
Wound check appointment. Steri-strips removed. Wound without redness or edema. Incision edges approximated, wound well healed. Normal device function. Thresholds, sensing, and impedances consistent with implant measurements. Device programmed at 3.5V for extra safety margin until 3 month visit. Histogram distribution appropriate for patient and level of activity. No ventricular arrhythmias noted. Patient educated about wound care, arm mobility, lifting restrictions, shock plan. ROV 10/27/2017 w/ WC

## 2017-08-19 ENCOUNTER — Ambulatory Visit (HOSPITAL_COMMUNITY)
Admission: RE | Admit: 2017-08-19 | Discharge: 2017-08-19 | Disposition: A | Discharge status: Home / Self Care | Payer: 59 | Source: Ambulatory Visit | Attending: Internal Medicine | Admitting: Internal Medicine

## 2017-08-19 DIAGNOSIS — Z950 Presence of cardiac pacemaker: Secondary | ICD-10-CM | POA: Insufficient documentation

## 2017-08-19 DIAGNOSIS — I5022 Chronic systolic (congestive) heart failure: Secondary | ICD-10-CM | POA: Diagnosis present

## 2017-08-19 DIAGNOSIS — Z951 Presence of aortocoronary bypass graft: Secondary | ICD-10-CM | POA: Insufficient documentation

## 2017-08-19 DIAGNOSIS — I2510 Atherosclerotic heart disease of native coronary artery without angina pectoris: Secondary | ICD-10-CM | POA: Insufficient documentation

## 2017-08-19 DIAGNOSIS — I480 Paroxysmal atrial fibrillation: Secondary | ICD-10-CM | POA: Insufficient documentation

## 2017-08-19 DIAGNOSIS — I252 Old myocardial infarction: Secondary | ICD-10-CM | POA: Insufficient documentation

## 2017-08-19 NOTE — Patient Instructions (Addendum)
Plan to continue current medications Continue to walk around the block Continue to follow low salt diet  Follow up with Dr. Shirlee Latch 10/20/2017 at 11am

## 2017-08-19 NOTE — Progress Notes (Signed)
PCP: [facility] MD: Shirlee Latch    HPI:    Ascher Schroepfer is a 62 y.o. 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   Admitted 1/6 -> 04/09/17 with NSTEMI with sudden onset of chest tightness and SOB. Noted to be in rapid Afib on arrival. He spontaneously converted to NSR with treatment of his chest pain. Underwent LHC that showed critical 3vD. IABP placed with cardiogenic shock and TCTS consulted for CABG. Pt underwent CABG 03/31/2017 with no immediate complications. Pressors weaned and extubated as tolerated. Weaned off IABP POD #1. Pt diuresed with IV lasix and HF medications adjusted as tolerated.  Discharge weight 217 lbs  He returned 4/19 for follow up of CHF and CAD.  He continues to do well symptomatically.  He walks for 30 minutes/1.5 miles on most days on his treadmill.  He walks his dog.  No significant chest pain or exertional dyspnea. No orthopnea/PND.  No palpitations.   He had Implantation of a MDT single chamber ICD on 07/23/17 by Dr Elberta Fortis.  The patient received a Medtronic Visia AF MRI SureScan (serial Number [id number]) ICD,Medtronic, model [id number] (serial number [id number]) right ventricular defibrillator lead  DFT's were deferred at time of implant.   There were no immediate post procedure complications. 2.  CXR on 07/24/17 demonstrated no pneumothorax status post device implantation.  He returns today to Pharmacy Medication titration clinic.  At last visit 4/19 with Dr. Shirlee Latch his spironolactone was increased to  daily.  He continues to walk daily around the block with dog and play with grandkids. Feeling well. Last BMET checked at primary MD office 08/10/17 -wnl   . Shortness of breath/dyspnea on exertion? no  . Orthopnea/PND? no . Edema? no . Lightheadedness/dizziness? Yes - if sit to stand too fast or bend over and stand up quickly . Daily weights at home? yes . Blood pressure/heart rate monitoring at home?  no . Following low-sodium/fluid-restricted diet? Yes  HF Medications: Carvedilol 6.25mg  BID Entresto 24-26mg  BID Spironolactone  Daily  Has the patient been experiencing any side effects to the medications prescribed?  yes  Does the patient have any problems obtaining medications due to transportation or finances?   no  Understanding of regimen: good Understanding of indications: good Potential of compliance: good Patient understands to avoid NSAIDs. Patient understands to avoid decongestants.    Pertinent Lab Values: 08/10/17 . Serum creatinine 0.9 stable, CO2 22, .  Potassium 5, Sodium 141   Vital Signs: . Weight: 217lb  (dry weight: 217lb at home) . Blood pressure: 100/60 . Heart rate: 62 . O2 95%  Assessment: 1. Chronicsystolic CHF (EF 82%), due to ICM. NYHA class IIsymptoms. -Volume status stable  - Potassium did bump slightly  4.4 >5 after increasing spironolactone earlier this month - still WNL -No changes of current medications with HR 60 and BP soft 100/60 - concern he would become hypotensive with doubling Entresto at this time - We did discuss the plan of titrating medications to target doses in the future -continue current medication  Carvedilol 6.25mg  BID Entresto 24-26mg  BID Spironolactone  Daily - Basic disease state pathophysiology, medication indication, mechanism and side effects reviewed at length with patient and he verbalized understanding   2.CAD:Strong FH premature CAD.NSTEMI, now s/p CABG x 5 03/31/2017.  No chest pain.   - Continue ASA 81, Plavix, and statin.  3. Atrial fibrillation: Paroxysmal. Only noted at initial admission in setting of severe  CP/NSTEMI.NSR today.  - If recurs, he will need to be anticoagulated (holding off for now). - per MD 4. Smoking: Has not smoked since January.    Plan: 1) Medication changes: Based on clinical presentation, vital signs - BP soft 100/60  - did not increase Entresto and HR 60  - did not  increase Carvedilol and recent labs - WNL - will continue current medications for now with the understanding that goal is to titrate medications to target doses as BP/HR tolerate 2) Labs: follow up in July at next MD appt 3) Follow-up: with Dr. Shirlee Latch July 30   Ryan Durham Pharm.D. CPP, BCPS Clinical Pharmacist [phone] 08/19/2017 8:09 AM

## 2017-09-02 ENCOUNTER — Other Ambulatory Visit (HOSPITAL_COMMUNITY): Payer: Self-pay | Admitting: Cardiology

## 2017-10-20 ENCOUNTER — Ambulatory Visit (HOSPITAL_COMMUNITY)
Admission: RE | Admit: 2017-10-20 | Discharge: 2017-10-20 | Disposition: A | Discharge status: Home / Self Care | Payer: 59 | Source: Ambulatory Visit | Attending: Cardiology | Admitting: Cardiology

## 2017-10-20 ENCOUNTER — Encounter (HOSPITAL_COMMUNITY): Payer: Self-pay | Admitting: Cardiology

## 2017-10-20 VITALS — BP 100/60 | HR 65 | Wt 216.2 lb

## 2017-10-20 DIAGNOSIS — Z8249 Family history of ischemic heart disease and other diseases of the circulatory system: Secondary | ICD-10-CM | POA: Diagnosis not present

## 2017-10-20 DIAGNOSIS — Z7902 Long term (current) use of antithrombotics/antiplatelets: Secondary | ICD-10-CM | POA: Diagnosis not present

## 2017-10-20 DIAGNOSIS — I252 Old myocardial infarction: Secondary | ICD-10-CM | POA: Diagnosis not present

## 2017-10-20 DIAGNOSIS — I214 Non-ST elevation (NSTEMI) myocardial infarction: Secondary | ICD-10-CM | POA: Diagnosis not present

## 2017-10-20 DIAGNOSIS — Z87891 Personal history of nicotine dependence: Secondary | ICD-10-CM | POA: Diagnosis not present

## 2017-10-20 DIAGNOSIS — I255 Ischemic cardiomyopathy: Secondary | ICD-10-CM | POA: Insufficient documentation

## 2017-10-20 DIAGNOSIS — I5022 Chronic systolic (congestive) heart failure: Secondary | ICD-10-CM | POA: Diagnosis not present

## 2017-10-20 DIAGNOSIS — Z951 Presence of aortocoronary bypass graft: Secondary | ICD-10-CM | POA: Insufficient documentation

## 2017-10-20 DIAGNOSIS — Z7982 Long term (current) use of aspirin: Secondary | ICD-10-CM | POA: Diagnosis not present

## 2017-10-20 DIAGNOSIS — R9431 Abnormal electrocardiogram [ECG] [EKG]: Secondary | ICD-10-CM | POA: Diagnosis not present

## 2017-10-20 DIAGNOSIS — Z79899 Other long term (current) drug therapy: Secondary | ICD-10-CM | POA: Diagnosis not present

## 2017-10-20 DIAGNOSIS — I2510 Atherosclerotic heart disease of native coronary artery without angina pectoris: Secondary | ICD-10-CM | POA: Diagnosis not present

## 2017-10-20 DIAGNOSIS — I480 Paroxysmal atrial fibrillation: Secondary | ICD-10-CM | POA: Diagnosis not present

## 2017-10-20 DIAGNOSIS — Z09 Encounter for follow-up examination after completed treatment for conditions other than malignant neoplasm: Secondary | ICD-10-CM | POA: Diagnosis present

## 2017-10-20 DIAGNOSIS — I25810 Atherosclerosis of coronary artery bypass graft(s) without angina pectoris: Secondary | ICD-10-CM | POA: Diagnosis not present

## 2017-10-20 LAB — BASIC METABOLIC PANEL
ANION GAP: 7 (ref 5–15)
BUN: 22 mg/dL — ABNORMAL HIGH (ref 6–20)
CO2: 26 mmol/L (ref 22–32)
CREATININE: 1.07 mg/dL (ref 0.61–1.24)
Calcium: 9.6 mg/dL (ref 8.9–10.3)
Chloride: 106 mmol/L (ref 98–111)
GFR calc non Af Amer: >60 mL/min (ref >60)
Glucose, Bld: 93 mg/dL (ref 70–99)
Potassium: 5.3 mmol/L — ABNORMAL HIGH (ref 3.5–5.1)
Sodium: 139 mmol/L (ref 135–145)

## 2017-10-20 NOTE — Progress Notes (Signed)
Advanced Heart Failure Clinic Note    Primary Care: None  Primary Cardiologist: Dr. Shirlee LatchMcLean   HPI: Ryan Durham is a 62 y.o. 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   Admitted 1/6 -> 04/09/17 with NSTEMI with sudden onset of chest tightness and SOB. Noted to be in rapid Afib on arrival. He spontaneously converted to NSR with treatment of his chest pain. Underwent LHC that showed critical 3vD. IABP placed with cardiogenic shock and TCTS consulted for CABG. Pt underwent CABG 03/31/2017 with no immediate complications. Pressors weaned and extubated as tolerated. Weaned off IABP POD #1. Pt diuresed with IV lasix and HF medications adjusted as tolerated.  Discharge weight 217 lbs.   Medtronic ICD placed in 5/19.   He returns for followup of CHF and CAD.  He continues to do well symptomatically.  No exertional dyspnea or chest pain.  Occasional mild lightheadedness with standing.  No problems walking up stairs.  No orthopnea/PND.  Weight stable. No palpitations.   Labs (3/19): LDL 56, HDL 26, K 4.3, creatinine 1.611.02 Labs (5/19): K 5, creatinine 0.94  ECG (personally reviewed): NSR, LAFB, lateral T wave inversions.   Past Medical History 1. Chronic systolic CHF: ECHO 03/30/2017 EF 25-30%. Ischemic cardiomyopathy, now s/p CABG. - Echo (4/19): EF 30-35%, wall motion abnormalities, grade 2 diastolic dysfunction, normal RV size and systolic function.   - Medtronic ICD.  2. WRU:EAVWUJCAD:Strong FH premature CAD.NSTEMI 1/19, now s/p CABG 03/2017 3. Atrial fibrillation: Paroxysmal. Noted only in setting of NSTEMI.  4. Smoking: Has quit.   ROS: All systems reviewed and negative except as per HPI.    Current Outpatient Medications  Medication Sig Dispense Refill  . aspirin EC 81 MG EC tablet Take 1 tablet (81 mg total) by mouth daily.    [name]. atorvastatin (LIPITOR) 80 MG tablet Take 1 tablet (80 mg total) by mouth daily at 6 PM. 30 tablet 5  .  carvedilol (COREG) 3.125 MG tablet Take 2 tablets (6.25 mg total) by mouth 2 (two) times daily with a meal. 120 tablet 5  . clopidogrel (PLAVIX) 75 MG tablet Take 1 tablet (75 mg total) by mouth daily. 30 tablet 5  . sacubitril-valsartan (ENTRESTO) 24-26 MG Take 1 tablet by mouth 2 (two) times daily. 60 tablet 11  . spironolactone (ALDACTONE) 25 MG tablet Take 1 tablet (25 mg total) by mouth daily. 30 tablet 5   No current facility-administered medications for this encounter.    No Known Allergies  Social History   Socioeconomic History  . Marital status: Married    Spouse name: Not on file  . Number of children: Not on file  . Years of education: Not on file  . Highest education level: Not on file  Occupational History  . Not on file  Social Needs  . Financial resource strain: Not on file  . Food insecurity:    Worry: Not on file    Inability: Not on file  . Transportation needs:    Medical: Not on file    Non-medical: Not on file  Tobacco Use  . Smoking status: Former Smoker    Packs/day: 1.00    Years: 30.00    Pack years: 30.00    Types: Cigarettes  . Smokeless tobacco: Never Used  Substance and Sexual Activity  . Alcohol use: No    Frequency: Never  . Drug use: No  . Sexual activity: Not on file  Lifestyle  . Physical activity:    Days per week: Not on file    Minutes per session: Not on file  . Stress: Not on file  Relationships  . Social connections:    Talks on phone: Not on file    Gets together: Not on file    Attends religious service: Not on file    Active member of club or organization: Not on file    Attends meetings of clubs or organizations: Not on file    Relationship status: Not on file  . Intimate partner violence:    Fear of current or ex partner: Not on file    Emotionally abused: Not on file    Physically abused: Not on file    Forced sexual activity: Not on file  Other Topics Concern  . Not on file  Social History Narrative   Works in  [employer] for [employer]   Family History  Problem Relation Age of Onset  . Cirrhosis Mother   . Heart attack Sister   . Myocarditis Brother    Vitals:   10/20/17 1039  BP: 100/60  Pulse: 65  SpO2: 97%  Weight: 216 lb 3.2 oz (98.1 kg)   Wt Readings from Last 3 Encounters:  10/20/17 216 lb 3.2 oz (98.1 kg)  08/19/17 217 lb 6.4 oz (98.6 kg)  07/24/17 212 lb 4.8 oz (96.3 kg)    PHYSICAL EXAM: General: NAD Neck: No JVD, no thyromegaly or thyroid nodule.  Lungs: Clear to auscultation bilaterally with normal respiratory effort. CV: Nondisplaced PMI.  Heart regular S1/S2, no S3/S4, no murmur.  No peripheral edema.  No carotid bruit.  Normal pedal pulses.  Abdomen: Soft, nontender, no hepatosplenomegaly, no distention.  Skin: Intact without lesions or rashes.  Neurologic: Alert and oriented x 3.  Psych: Normal affect. Extremities: No clubbing or cyanosis.  HEENT: Normal.   ASSESSMENT & PLAN:  1. Chronic systolic CHF: ECHO 03/30/2017 EF 25-30%. Ischemic cardiomyopathy, now s/p CABG as below. Repeat echo in 4/19 with EF still low at 30-35%.  He now has a Medtronic ICD.  NYHA class II symptoms. He is not volume overloaded on exam. No BP room to increase meds but minimal lightheadedness.  - Continue Coreg 6.25 mg bid.  - Continue Entresto 24/26 bid.   - Continue spironolactone 25 mg daily.  - BMET today.  2. ZOX:WRUEAV FH premature CAD.NSTEMI, now s/p CABG x 5 03/31/2017.  No chest pain.   - Continue ASA 81, Plavix, and statin.  3. Atrial fibrillation: Paroxysmal. Only noted at initial admission in setting of severe CP/NSTEMI. NSR today.  - If recurs, he will need to be anticoagulated (holding off for now). 4. Smoking: Has not smoked since 1/19.   Followup in 3 months.    Marca Ancona, MD 10/20/17

## 2017-10-20 NOTE — Patient Instructions (Signed)
No changes to medication at this time.  Routine lab work today. Will notify you of abnormal results, otherwise no news is good news!  EKG today.  Follow up 3 months with Dr. Shirlee LatchMcLean.  ____________________________________________________________ Vallery RidgeGarage Code: 1700  Take all medication as prescribed the day of your appointment. Bring all medications with you to your appointment.  Do the following things EVERYDAY: 1) Weigh yourself in the morning before breakfast. Write it down and keep it in a log. 2) Take your medicines as prescribed 3) Eat low salt foods-Limit salt (sodium) to 2000 mg per day.  4) Stay as active as you can everyday 5) Limit all fluids for the day to less than 2 liters

## 2017-10-26 ENCOUNTER — Other Ambulatory Visit (HOSPITAL_COMMUNITY): Payer: Self-pay

## 2017-10-26 MED ORDER — ATORVASTATIN CALCIUM 80 MG PO TABS: 80.0000 mg | ORAL_TABLET | Freq: Every day | ORAL | 5 refills | Status: DC

## 2017-10-26 MED ORDER — CLOPIDOGREL BISULFATE 75 MG PO TABS: 75.0000 mg | ORAL_TABLET | Freq: Every day | ORAL | 5 refills | Status: DC

## 2017-10-27 ENCOUNTER — Ambulatory Visit (INDEPENDENT_AMBULATORY_CARE_PROVIDER_SITE_OTHER): Payer: 59 | Admitting: Cardiology

## 2017-10-27 ENCOUNTER — Encounter: Payer: Self-pay | Admitting: Cardiology

## 2017-10-27 VITALS — BP 84/60 | HR 63 | Ht 70.0 in | Wt 215.2 lb

## 2017-10-27 DIAGNOSIS — I2510 Atherosclerotic heart disease of native coronary artery without angina pectoris: Secondary | ICD-10-CM | POA: Diagnosis not present

## 2017-10-27 DIAGNOSIS — I5022 Chronic systolic (congestive) heart failure: Secondary | ICD-10-CM

## 2017-10-27 DIAGNOSIS — E785 Hyperlipidemia, unspecified: Secondary | ICD-10-CM

## 2017-10-27 DIAGNOSIS — I255 Ischemic cardiomyopathy: Secondary | ICD-10-CM | POA: Diagnosis not present

## 2017-10-27 DIAGNOSIS — I10 Essential (primary) hypertension: Secondary | ICD-10-CM | POA: Diagnosis not present

## 2017-10-27 LAB — CUP PACEART INCLINIC DEVICE CHECK
Date Time Interrogation Session: 20190806103104
HighPow Impedance: 68 Ohm
Implantable Lead Location: 753860
Implantable Pulse Generator Implant Date: 20190502
Lead Channel Pacing Threshold Pulse Width: 0.4 ms
Lead Channel Sensing Intrinsic Amplitude: 7 mV
Lead Channel Setting Pacing Amplitude: 2.5 V
Lead Channel Setting Sensing Sensitivity: 0.3 mV
MDC IDC LEAD IMPLANT DT: 20190502
MDC IDC MSMT BATTERY REMAINING LONGEVITY: 128 mo
MDC IDC MSMT BATTERY VOLTAGE: 3.02 V
MDC IDC MSMT LEADCHNL RV IMPEDANCE VALUE: 323 Ohm
MDC IDC MSMT LEADCHNL RV IMPEDANCE VALUE: 380 Ohm
MDC IDC MSMT LEADCHNL RV PACING THRESHOLD AMPLITUDE: 1 V
MDC IDC MSMT LEADCHNL RV SENSING INTR AMPL: 10 mV
MDC IDC SET LEADCHNL RV PACING PULSEWIDTH: 0.4 ms
MDC IDC STAT BRADY RV PERCENT PACED: 0.03 %

## 2017-10-27 NOTE — Progress Notes (Signed)
Electrophysiology Office Note   Date:  10/27/2017   ID:  Ryan Durham, DOB 1955-07-21, MRN [id number]  PCP:  Shelle IronSistasis, Jim, MD  Cardiologist:  Shirlee LatchMcLean Primary Electrophysiologist:  Regan LemmingWill Martin Tayler Lassen, MD    No chief complaint on file.    History of Present Illness: Ryan Durham is a 62 y.o. male who is being seen today for the evaluation of CHF at the request of Marca AnconaDalton McLean. Presenting today for electrophysiology evaluation.  Is a history of coronary artery disease status post CABG 03/2017, systolic heart failure secondary to ischemic cardiomyopathy, atrial fibrillation in the setting of STEMI, and tobacco abuse.  In January 2019 he had a sudden onset of chest tightness and shortness of breath.  He was noted to be in rapid atrial fibrillation.  Left heart catheterization showed critical three-vessel disease.  He had a 5 vessel CABG.  At this point, he walks between 30 minutes, 1/2 miles on most days on his treadmill.  He walks his dog.  No significant chest pain or dyspnea.  An echo showed an EF of 30 to 35%.  Today, denies symptoms of palpitations, chest pain, shortness of breath, orthopnea, PND, lower extremity edema, claudication, dizziness, presyncope, syncope, bleeding, or neurologic sequela. The patient is tolerating medications without difficulties.  Overall he is feeling well.  He has had no further symptoms of chest pain or shortness of breath.   Past Medical History:  Diagnosis Date  . Acute on chronic systolic heart failure (HCC) 03/29/2017  . Anginal pain (HCC)    this admission  . Cardiomyopathy, ischemic 03/30/2017  . Coronary artery disease    this admission  . Coronary artery disease involving native coronary artery of native heart with unstable angina pectoris (HCC) 03/29/2017  . Dyspnea    this admission  . Dysrhythmia    afib with this admission/runs of VTACH/Afib both with this admission  . Hyperlipidemia   . Myocardial infarction [facility])    this admission  .  Paroxysmal atrial fibrillation (HCC) 03/29/2017  . Tobacco abuse   . Type II diabetes mellitus (HCC) 03/30/2017   Past Surgical History:  Procedure Laterality Date  . CLIPPING OF ATRIAL APPENDAGE Left 03/31/2017   Procedure: CLIPPING OF ATRIAL APPENDAGE;  Surgeon: Purcell Nailswen, Clarence H, MD;  [facility];  Service: Open Heart Surgery;  Laterality: Left;  . CORONARY ANGIOPLASTY  03/30/2017  . CORONARY ARTERY BYPASS GRAFT N/A 03/31/2017   Procedure: CORONARY ARTERY BYPASS GRAFTING (CABG) time 5 using bilateral saphaneous vien, harvested endoscopicly and right internal mammary artery.;  Surgeon: Purcell Nailswen, Clarence H, MD;  [facility];  Service: Open Heart Surgery;  Laterality: N/A;  . CORONARY/GRAFT ACUTE MI REVASCULARIZATION N/A 03/29/2017   Procedure: Coronary/Graft Acute MI Revascularization;  Surgeon: Kathleene HazelMcAlhany, Christopher D, MD;  Location: [facility] INVASIVE CV LAB;  Service: Cardiovascular;  Laterality: N/A;  . IABP INSERTION N/A 03/29/2017   Procedure: IABP Insertion;  Surgeon: Kathleene HazelMcAlhany, Christopher D, MD;  Location: [facility] INVASIVE CV LAB;  Service: Cardiovascular;  Laterality: N/A;  . ICD IMPLANT N/A 07/23/2017   Procedure: ICD IMPLANT;  Surgeon: Regan Lemmingamnitz, Khaden Gater Martin, MD;  Location: [facility] INVASIVE CV LAB;  Service: Cardiovascular;  Laterality: N/A;  . LEFT HEART CATH AND CORONARY ANGIOGRAPHY N/A 03/29/2017   Procedure: LEFT HEART CATH AND CORONARY ANGIOGRAPHY;  Surgeon: Kathleene HazelMcAlhany, Christopher D, MD;  Location: [facility] INVASIVE CV LAB;  Service: Cardiovascular;  Laterality: N/A;  . TEE WITHOUT CARDIOVERSION N/A 03/31/2017   Procedure: TRANSESOPHAGEAL ECHOCARDIOGRAM (TEE);  Surgeon: Purcell Nailswen, Clarence H,  MD;  Location: [facility] OR;  Service: Open Heart Surgery;  Laterality: N/A;     Current Outpatient Medications  Medication Sig Dispense Refill  . aspirin EC 81 MG EC tablet Take 1 tablet (81 mg total) by mouth daily.    [name] atorvastatin (LIPITOR) 80 MG tablet Take 1 tablet (80 mg total) by mouth daily at 6 PM. 30 tablet 5  . carvedilol  (COREG) 3.125 MG tablet Take 2 tablets (6.25 mg total) by mouth 2 (two) times daily with a meal. 120 tablet 5  . clopidogrel (PLAVIX) 75 MG tablet Take 1 tablet (75 mg total) by mouth daily. 30 tablet 5  . sacubitril-valsartan (ENTRESTO) 24-26 MG Take 1 tablet by mouth 2 (two) times daily. 60 tablet 11  . spironolactone (ALDACTONE) 25 MG tablet Take 1 tablet (25 mg total) by mouth daily. 30 tablet 5   No current facility-administered medications for this visit.     Allergies:   Patient has no known allergies.   Social History:  The patient  reports that he has quit smoking. His smoking use included cigarettes. He has a 30.00 pack-year smoking history. He has never used smokeless tobacco. He reports that he does not drink alcohol or use drugs.   Family History:  The patient's family history includes Cirrhosis in his mother; Heart attack in his sister; Myocarditis in his brother.   ROS:  Please see the history of present illness.   Otherwise, review of systems is positive for none.   All other systems are reviewed and negative.   PHYSICAL EXAM: VS:  BP (!) 84/60   Pulse 63   Ht 5\' 10"  (1.778 m)   Wt 215 lb 3.2 oz (97.6 kg)   SpO2 93%   BMI 30.88 kg/m  , BMI Body mass index is 30.88 kg/m. GEN: Well nourished, well developed, in no acute distress  HEENT: normal  Neck: no JVD, carotid bruits, or masses Cardiac: RRR; no murmurs, rubs, or gallops,no edema  Respiratory:  clear to auscultation bilaterally, normal work of breathing GI: soft, nontender, nondistended, + BS MS: no deformity or atrophy  Skin: warm and dry Neuro:  Strength and sensation are intact Psych: euthymic mood, full affect  EKG:  EKG is not ordered today. Personal review of the ekg ordered 10/20/17 shows sinus rhythm, left anterior fascicular block, rate 65  Recent Labs: 03/29/2017: TSH 4.454 03/30/2017: B Natriuretic Peptide 617.3 04/01/2017: Magnesium 2.2 04/03/2017: ALT 137 07/21/2017: Hemoglobin 15.2; Platelets  130 10/20/2017: BUN 22; Creatinine, Ser 1.07; Potassium 5.3; Sodium 139    Lipid Panel     Component Value Date/Time   CHOL 106 06/17/2017 1239   TRIG 119 06/17/2017 1239   HDL 26 (L) 06/17/2017 1239   CHOLHDL 4.1 06/17/2017 1239   VLDL 24 06/17/2017 1239   LDLCALC 56 06/17/2017 1239     Wt Readings from Last 3 Encounters:  10/27/17 215 lb 3.2 oz (97.6 kg)  10/20/17 216 lb 3.2 oz (98.1 kg)  08/19/17 217 lb 6.4 oz (98.6 kg)      Other studies Reviewed: Additional studies/ records that were reviewed today include: TTE 07/20/17  Review of the above records today demonstrates:  - Left ventricle: The cavity size was normal. Wall thickness was   normal. Systolic function was moderately to severely reduced. The   estimated ejection fraction was in the range of 30% to 35%.   Anteroseptal/inferoseptal, anterior, and inferolateral severe   hypokinesis. Features are consistent with a pseudonormal left  ventricular filling pattern, with concomitant abnormal relaxation   and increased filling pressure (grade 2 diastolic dysfunction). - Aortic valve: There was no stenosis. - Aorta: Borderline dilated aortic root. Aortic root dimension: 37   mm (ED). - Mitral valve: There was no significant regurgitation. - Right ventricle: The cavity size was normal. Systolic function   was normal. - Pulmonary arteries: No complete TR doppler jet so unable to   estimate PA systolic pressure. - Inferior vena cava: The vessel was normal in size. The   respirophasic diameter changes were in the normal range (>= 50%),   consistent with normal central venous pressure. - Pericardium, extracardiac: A trivial pericardial effusion was   identified.  LHC 03/30/17  Prox RCA lesion is 80% stenosed.  Prox RCA to Dist RCA lesion is 30% stenosed.  Ost RPDA lesion is 99% stenosed.  Post Atrio lesion is 80% stenosed.  Dist LM to Ost LAD lesion is 100% stenosed.  Ost 1st Mrg lesion is 100% stenosed.  Ost Cx  to Prox Cx lesion is 99% stenosed.  Ost 2nd Mrg to 2nd Mrg lesion is 70% stenosed.   1. Severe triple vessel CAD  2.  The LAD is not visualized on injections of the left system. The proximal LAD appears to be chronically occluded. The distal LAD and a diagonal branch fills from right to left collaterals.  3. The Circumflex has a 99% proximal stenosis at the takeoff of a moderate caliber OM branch which fills slowly. The second OM branch has a moderately severe stenosis.  4. The RCA is a large dominant vessel with high grade disease in the proximal vessel, PDA and PLA.  5. Elevated filling pressures.    ASSESSMENT AND PLAN:  1.  Systolic heart failure due to ischemic cardiomyopathy: Currently on optimal medical therapy.  Status post Medtronic ICD implanted 07/23/2017.  Device functioning appropriately.  No changes at this time.  2.  Coronary artery disease status post CABG: No current chest pain.  Continue aspirin, Plavix, statin  3.  Atrial fibrillation, paroxysmal: None seen on his device.  We Kory Panjwani anticoagulate if he does have more.  4.  Tobacco abuse: Continue to encourage cessation    Current medicines are reviewed at length with the patient today.   The patient does not have concerns regarding his medicines.  The following changes were made today: None  Labs/ tests ordered today include:  No orders of the defined types were placed in this encounter.   Disposition:   FU with Marlee Trentman at 9 months  Signed, Elayah Klooster Jorja Loa, MD  10/27/2017 10:07 AM     [facility] [address] [place] [address] [phone] (office) [phone] (fax)

## 2017-10-27 NOTE — Patient Instructions (Signed)
Medication Instructions:  Your physician recommends that you continue on your current medications as directed. Please refer to the Current Medication list given to you today.  *If you need a refill on your cardiac medications before your next appointment, please call your pharmacy*  Labwork: None ordered  Testing/Procedures: None ordered  Follow-Up: Remote monitoring is used to monitor your Pacemaker or ICD from home. This monitoring reduces the number of office visits required to check your device to one time per year. It allows us to keep an eye on the functioning of your device to ensure it is working properly. You are scheduled for a device check from home on 01/26/2018. You may send your transmission at any time that day. If you have a wireless device, the transmission will be sent automatically. After your physician reviews your transmission, you will receive a postcard with your next transmission date.  Your physician wants you to follow-up in: 9 months with Dr. Elberta Fortisamnitz.  You will receive a reminder letter in the mail two months in advance. If you don't receive a letter, please call our office to schedule the follow-up appointment.  Thank you for choosing [facility]!!   Dory HornSherri Malik Ruffino, RN [phone]  Any Other Special Instructions Will Be Listed Below (If Applicable).

## 2017-11-02 ENCOUNTER — Ambulatory Visit (HOSPITAL_COMMUNITY)
Admission: RE | Admit: 2017-11-02 | Discharge: 2017-11-02 | Disposition: A | Discharge status: Home / Self Care | Payer: 59 | Source: Ambulatory Visit | Attending: Cardiology | Admitting: Cardiology

## 2017-11-02 DIAGNOSIS — I255 Ischemic cardiomyopathy: Secondary | ICD-10-CM | POA: Diagnosis not present

## 2017-11-02 LAB — BASIC METABOLIC PANEL
ANION GAP: 9 (ref 5–15)
BUN: 23 mg/dL — ABNORMAL HIGH (ref 6–20)
CALCIUM: 9.4 mg/dL (ref 8.9–10.3)
CO2: 25 mmol/L (ref 22–32)
Chloride: 105 mmol/L (ref 98–111)
Creatinine, Ser: 1.05 mg/dL (ref 0.61–1.24)
GFR calc Af Amer: >60 mL/min (ref >60)
Glucose, Bld: 118 mg/dL — ABNORMAL HIGH (ref 70–99)
POTASSIUM: 4.7 mmol/L (ref 3.5–5.1)
SODIUM: 139 mmol/L (ref 135–145)

## 2017-11-11 ENCOUNTER — Other Ambulatory Visit: Payer: Self-pay

## 2017-12-25 ENCOUNTER — Other Ambulatory Visit (HOSPITAL_COMMUNITY): Payer: Self-pay

## 2017-12-25 MED ORDER — CARVEDILOL 3.125 MG PO TABS: 6.2500 mg | ORAL_TABLET | Freq: Two times a day (BID) | ORAL | 5 refills | Status: DC

## 2018-01-25 ENCOUNTER — Ambulatory Visit (HOSPITAL_COMMUNITY)
Admission: RE | Admit: 2018-01-25 | Discharge: 2018-01-25 | Disposition: A | Discharge status: Home / Self Care | Payer: 59 | Source: Ambulatory Visit | Attending: Cardiology | Admitting: Cardiology

## 2018-01-25 ENCOUNTER — Encounter (HOSPITAL_COMMUNITY): Payer: Self-pay | Admitting: Cardiology

## 2018-01-25 VITALS — BP 100/58 | HR 61 | Wt 219.8 lb

## 2018-01-25 DIAGNOSIS — Z8249 Family history of ischemic heart disease and other diseases of the circulatory system: Secondary | ICD-10-CM | POA: Insufficient documentation

## 2018-01-25 DIAGNOSIS — Z7902 Long term (current) use of antithrombotics/antiplatelets: Secondary | ICD-10-CM | POA: Insufficient documentation

## 2018-01-25 DIAGNOSIS — Z951 Presence of aortocoronary bypass graft: Secondary | ICD-10-CM | POA: Diagnosis not present

## 2018-01-25 DIAGNOSIS — I5022 Chronic systolic (congestive) heart failure: Secondary | ICD-10-CM | POA: Insufficient documentation

## 2018-01-25 DIAGNOSIS — I252 Old myocardial infarction: Secondary | ICD-10-CM | POA: Diagnosis not present

## 2018-01-25 DIAGNOSIS — I480 Paroxysmal atrial fibrillation: Secondary | ICD-10-CM

## 2018-01-25 DIAGNOSIS — Z87891 Personal history of nicotine dependence: Secondary | ICD-10-CM | POA: Insufficient documentation

## 2018-01-25 DIAGNOSIS — I255 Ischemic cardiomyopathy: Secondary | ICD-10-CM | POA: Diagnosis not present

## 2018-01-25 DIAGNOSIS — R0989 Other specified symptoms and signs involving the circulatory and respiratory systems: Secondary | ICD-10-CM | POA: Insufficient documentation

## 2018-01-25 DIAGNOSIS — Z7982 Long term (current) use of aspirin: Secondary | ICD-10-CM | POA: Diagnosis not present

## 2018-01-25 DIAGNOSIS — I2510 Atherosclerotic heart disease of native coronary artery without angina pectoris: Secondary | ICD-10-CM | POA: Diagnosis not present

## 2018-01-25 LAB — BASIC METABOLIC PANEL
ANION GAP: 4 — AB (ref 5–15)
BUN: 19 mg/dL (ref 6–20)
CO2: 24 mmol/L (ref 22–32)
Calcium: 9.3 mg/dL (ref 8.9–10.3)
Chloride: 108 mmol/L (ref 98–111)
Creatinine, Ser: 0.9 mg/dL (ref 0.61–1.24)
GFR calc Af Amer: >60 mL/min (ref >60)
GLUCOSE: 95 mg/dL (ref 70–99)
POTASSIUM: 4.1 mmol/L (ref 3.5–5.1)
Sodium: 136 mmol/L (ref 135–145)

## 2018-01-25 MED ORDER — CARVEDILOL 6.25 MG PO TABS: 9.3750 mg | ORAL_TABLET | Freq: Two times a day (BID) | ORAL | 3 refills | Status: DC

## 2018-01-25 NOTE — Progress Notes (Signed)
Advanced Heart Failure Clinic Note    Primary Care: Dr. Laymond Purser Primary Cardiologist: Dr. Shirlee Latch   HPI: Ryan Durham is a 62 y.o. 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   Admitted 1/6 -> 04/09/17 with NSTEMI with sudden onset of chest tightness and SOB. Noted to be in rapid Afib on arrival. He spontaneously converted to NSR with treatment of his chest pain. Underwent LHC that showed critical 3vD. IABP placed with cardiogenic shock and TCTS consulted for CABG. Pt underwent CABG 03/31/2017 with no immediate complications. Pressors weaned and extubated as tolerated. Weaned off IABP POD #1. Pt diuresed with IV lasix and HF medications adjusted as tolerated.  Discharge weight 217 lbs.   Medtronic ICD placed in 5/19.   He returns for followup of CHF and CAD.  He has been doing well recently.  Working full time.  No exertional dyspnea or chest pain.  No orthopnea/PND.  No lightheadedness.  No palpitations.    Labs (3/19): LDL 56, HDL 26, K 4.3, creatinine 5.62 Labs (5/19): K 5, creatinine 0.94  Labs (8/19): K 4.7, creatinine 1.05  Past Medical History 1. Chronic systolic CHF: ECHO 03/30/2017 EF 25-30%. Ischemic cardiomyopathy, now s/p CABG. - Echo (4/19): EF 30-35%, wall motion abnormalities, grade 2 diastolic dysfunction, normal RV size and systolic function.   - Medtronic ICD.  2. ZHY:QMVHQI FH premature CAD.NSTEMI 1/19, now s/p CABG 03/2017 3. Atrial fibrillation: Paroxysmal. Noted only in setting of NSTEMI.  4. Smoking: Has quit.   ROS: All systems reviewed and negative except as per HPI.    Current Outpatient Medications  Medication Sig Dispense Refill  . aspirin EC 81 MG EC tablet Take 1 tablet (81 mg total) by mouth daily.    [name] atorvastatin (LIPITOR) 80 MG tablet Take 1 tablet (80 mg total) by mouth daily at 6 PM. 30 tablet 5  . carvedilol (COREG) 6.25 MG tablet Take 1.5 tablets (9.375 mg total) by mouth 2 (two) times  daily with a meal. 90 tablet 3  . clopidogrel (PLAVIX) 75 MG tablet Take 1 tablet (75 mg total) by mouth daily. 30 tablet 5  . sacubitril-valsartan (ENTRESTO) 24-26 MG Take 1 tablet by mouth 2 (two) times daily. 60 tablet 11  . spironolactone (ALDACTONE) 25 MG tablet Take 1 tablet (25 mg total) by mouth daily. 30 tablet 5   No current facility-administered medications for this encounter.    No Known Allergies  Social History   Socioeconomic History  . Marital status: Married    Spouse name: Not on file  . Number of children: Not on file  . Years of education: Not on file  . Highest education level: Not on file  Occupational History  . Not on file  Social Needs  . Financial resource strain: Not on file  . Food insecurity:    Worry: Not on file    Inability: Not on file  . Transportation needs:    Medical: Not on file    Non-medical: Not on file  Tobacco Use  . Smoking status: Former Smoker    Packs/day: 1.00    Years: 30.00    Pack years: 30.00    Types: Cigarettes  . Smokeless tobacco: Never Used  Substance and Sexual Activity  . Alcohol use: No    Frequency: Never  . Drug use: No  . Sexual activity: Not on file  Lifestyle  . Physical activity:    Days  per week: Not on file    Minutes per session: Not on file  . Stress: Not on file  Relationships  . Social connections:    Talks on phone: Not on file    Gets together: Not on file    Attends religious service: Not on file    Active member of club or organization: Not on file    Attends meetings of clubs or organizations: Not on file    Relationship status: Not on file  . Intimate partner violence:    Fear of current or ex partner: Not on file    Emotionally abused: Not on file    Physically abused: Not on file    Forced sexual activity: Not on file  Other Topics Concern  . Not on file  Social History Narrative   Works in Naval architect for [employer]   Family History  Problem Relation Age of  Onset  . Cirrhosis Mother   . Heart attack Sister   . Myocarditis Brother    Vitals:   01/25/18 1107  BP: (!) 100/58  Pulse: 61  SpO2: 96%  Weight: 99.7 kg (219 lb 12.8 oz)   Wt Readings from Last 3 Encounters:  01/25/18 99.7 kg (219 lb 12.8 oz)  10/27/17 97.6 kg (215 lb 3.2 oz)  10/20/17 98.1 kg (216 lb 3.2 oz)    PHYSICAL EXAM: General: NAD Neck: No JVD, no thyromegaly or thyroid nodule.  Lungs: Clear to auscultation bilaterally with normal respiratory effort. CV: Nondisplaced PMI.  Heart regular S1/S2, no S3/S4, no murmur.  No peripheral edema.  Right carotid bruit.  Normal pedal pulses.  Abdomen: Soft, nontender, no hepatosplenomegaly, no distention.  Skin: Intact without lesions or rashes.  Neurologic: Alert and oriented x 3.  Psych: Normal affect. Extremities: No clubbing or cyanosis.  HEENT: Normal.   ASSESSMENT & PLAN:  1. Chronic systolic CHF: ECHO 03/30/2017 EF 25-30%. Ischemic cardiomyopathy, now s/p CABG as below. Repeat echo in 4/19 with EF still low at 30-35%.  He now has a Medtronic ICD.  NYHA class II symptoms. He is not volume overloaded on exam.  - Increase Coreg to 9.375 mg bid.  - Continue Entresto 24/26 bid.   - Continue spironolactone 25 mg daily.  - BMET today.  2. ZOX:WRUEAV FH premature CAD.NSTEMI, now s/p CABG x 5 03/31/2017.  No chest pain.   - Continue ASA 81, Plavix, and statin.  3. Atrial fibrillation: Paroxysmal. Only noted at initial admission in setting of severe CP/NSTEMI. NSR today.  - If recurs, he will need to be anticoagulated (holding off for now). 4. Smoking: Has not smoked since 1/19.  5. Carotid bruit: On right. I will arrange for carotid artery dopplers.   Followup in 3 months.    Marca Ancona, MD 01/25/18

## 2018-01-25 NOTE — Patient Instructions (Signed)
Labs done today  Increase coreg to 9.375mg  (1.5 tabs) twice a daily  Your physician has requested that you have a carotid duplex. This test is an ultrasound of the carotid arteries in your neck. It looks at blood flow through these arteries that supply the brain with blood. Allow one hour for this exam. There are no restrictions or special instructions. [facility] will call to schedule this appointment.   Follow up with Dr. Shirlee Latch in 3 months.

## 2018-01-26 ENCOUNTER — Telehealth: Payer: Self-pay

## 2018-01-26 ENCOUNTER — Ambulatory Visit (INDEPENDENT_AMBULATORY_CARE_PROVIDER_SITE_OTHER): Payer: 59 | Admitting: *Deleted

## 2018-01-26 DIAGNOSIS — I255 Ischemic cardiomyopathy: Secondary | ICD-10-CM | POA: Diagnosis not present

## 2018-01-26 NOTE — Telephone Encounter (Signed)
LMOVM reminding pt to send remote transmission.   

## 2018-01-27 NOTE — Progress Notes (Signed)
Remote ICD transmission.   

## 2018-01-28 ENCOUNTER — Other Ambulatory Visit (HOSPITAL_COMMUNITY): Payer: Self-pay

## 2018-01-28 MED ORDER — SPIRONOLACTONE 25 MG PO TABS: 25.0000 mg | ORAL_TABLET | Freq: Every day | ORAL | 5 refills | Status: DC

## 2018-01-31 ENCOUNTER — Encounter: Payer: Self-pay | Admitting: Cardiology

## 2018-02-08 ENCOUNTER — Ambulatory Visit (HOSPITAL_COMMUNITY)
Admission: RE | Admit: 2018-02-08 | Discharge: 2018-02-08 | Disposition: A | Discharge status: Home / Self Care | Payer: 59 | Source: Ambulatory Visit | Attending: Cardiology | Admitting: Cardiology

## 2018-02-08 DIAGNOSIS — R0989 Other specified symptoms and signs involving the circulatory and respiratory systems: Secondary | ICD-10-CM | POA: Insufficient documentation

## 2018-02-09 ENCOUNTER — Encounter (HOSPITAL_COMMUNITY): Payer: 59

## 2018-02-09 ENCOUNTER — Telehealth (HOSPITAL_COMMUNITY): Payer: Self-pay

## 2018-02-09 NOTE — Telephone Encounter (Signed)
Pt called with results 40-59% BICA stenosis, repeat study in 1 year.

## 2018-02-25 ENCOUNTER — Other Ambulatory Visit (HOSPITAL_COMMUNITY): Payer: Self-pay

## 2018-02-25 MED ORDER — CLOPIDOGREL BISULFATE 75 MG PO TABS: 75.0000 mg | ORAL_TABLET | Freq: Every day | ORAL | 5 refills | Status: DC

## 2018-03-28 LAB — CUP PACEART REMOTE DEVICE CHECK
Battery Remaining Longevity: 126 mo
Battery Voltage: 3.01 V
Brady Statistic RV Percent Paced: 0.08 %
Date Time Interrogation Session: 20191105211145
HIGH POWER IMPEDANCE MEASURED VALUE: 72 Ohm
Lead Channel Impedance Value: 304 Ohm
Lead Channel Impedance Value: 380 Ohm
Lead Channel Sensing Intrinsic Amplitude: 5.125 mV
Lead Channel Setting Pacing Amplitude: 2.5 V
Lead Channel Setting Sensing Sensitivity: 0.3 mV
MDC IDC LEAD IMPLANT DT: 20190502
MDC IDC LEAD LOCATION: 753860
MDC IDC MSMT LEADCHNL RV PACING THRESHOLD AMPLITUDE: 0.75 V
MDC IDC MSMT LEADCHNL RV PACING THRESHOLD PULSEWIDTH: 0.4 ms
MDC IDC MSMT LEADCHNL RV SENSING INTR AMPL: 5.125 mV
MDC IDC PG IMPLANT DT: 20190502
MDC IDC SET LEADCHNL RV PACING PULSEWIDTH: 0.4 ms

## 2018-04-05 ENCOUNTER — Ambulatory Visit: Payer: 59 | Admitting: Thoracic Surgery (Cardiothoracic Vascular Surgery)

## 2018-04-05 ENCOUNTER — Other Ambulatory Visit: Payer: Self-pay

## 2018-04-05 ENCOUNTER — Encounter: Payer: Self-pay | Admitting: Thoracic Surgery (Cardiothoracic Vascular Surgery)

## 2018-04-05 VITALS — BP 88/58 | HR 67 | Resp 16 | Ht 70.0 in | Wt 220.0 lb

## 2018-04-05 DIAGNOSIS — Z9889 Other specified postprocedural states: Secondary | ICD-10-CM | POA: Diagnosis not present

## 2018-04-05 DIAGNOSIS — Z951 Presence of aortocoronary bypass graft: Secondary | ICD-10-CM

## 2018-04-05 NOTE — Progress Notes (Signed)
[address]       [place] [id number]             [phone]     CARDIOTHORACIC SURGERY OFFICE NOTE  Referring Provider is Kathleene HazelMcAlhany, Christopher D*  Primary Cardiologist is Laurey MoraleMcLean, Dalton S, MD PCP is Shelle IronSistasis, Jim, MD   HPI:  Patient is a 63 year old male with history of longstanding tobacco abuse, hyperlipidemia, and a strong family history of coronary artery disease who returns to the office today for routine follow-up approximately 1 year status post emergent coronary artery bypass grafting x5 on March 31, 2017.  He presented with an acute ST segment elevation myocardial infarction complicated by cardiogenic shock.  Prior to surgery he also had an episode of paroxysmal atrial fibrillation.  He underwent clipping of his left atrial appendage at the time of surgery.  His postoperative recovery was somewhat slow but remarkably uneventful.  He was last seen here in our office on July 06, 2017 at which time he was doing well.  He has been followed carefully by Dr. Shirlee LatchMcLean, and follow-up echocardiogram performed last April revealed left ventricular ejection fraction estimated 30 to 35%.  He subsequently underwent ICD implantation for primary prevention.  Since then he has done remarkably well.  He is back at work and he reports no significant physical limitations.  He was seen 6 weeks ago by Dr. Shirlee LatchMcLean and scheduled to be seen again in follow-up in approximately 6 weeks.  He denies any symptoms of exertional shortness of breath or chest discomfort.  Weight has been stable.  Overall he has no complaints.  He is not smoking.   Current Outpatient Medications  Medication Sig Dispense Refill  . aspirin EC 81 MG EC tablet Take 1 tablet (81 mg total) by mouth daily.    [name]. atorvastatin (LIPITOR) 80 MG tablet Take 1 tablet (80 mg total) by mouth daily at 6 PM. 30 tablet 5  . carvedilol (COREG) 6.25 MG tablet Take 1.5 tablets (9.375 mg total) by mouth 2 (two) times daily with a  meal. 90 tablet 3  . clopidogrel (PLAVIX) 75 MG tablet Take 1 tablet (75 mg total) by mouth daily. 30 tablet 5  . sacubitril-valsartan (ENTRESTO) 24-26 MG Take 1 tablet by mouth 2 (two) times daily. 60 tablet 11  . spironolactone (ALDACTONE) 25 MG tablet Take 1 tablet (25 mg total) by mouth daily. 30 tablet 5   No current facility-administered medications for this visit.       Physical Exam:   BP (!) 88/58 (BP Location: Right Arm, Patient Position: Sitting, Cuff Size: Large)   Pulse 67   Resp 16   Ht 5\' 10"  (1.778 m)   Wt 220 lb (99.8 kg)   SpO2 95% Comment: ON RA  BMI 31.57 kg/m   General:  Well-appearing  Chest:   Clear to auscultation  CV:   Regular rate and rhythm without murmur  Incisions:  Completely healed  Abdomen:  Soft nontender  Extremities:  Warm and well-perfused  Diagnostic Tests:  n/a   Impression:  Patient is doing very well approximately 1 year status post emergency coronary artery bypass grafting x5 with clipping of left atrial appendage  Plan:  We have not recommended any change the patient's current medications.  The patient will discuss with Dr. Shirlee LatchMcLean whether or not he should remain on Plavix for the long-term.  The patient has been reminded with many benefits of regular exercise and heart healthy diet.  All of his questions been addressed.  In the future he will call and return to see [place] only should specific problems or questions arise.  I spent in excess of 15 minutes during the conduct of this office consultation and >50% of this time involved direct face-to-face encounter with the patient for counseling and/or coordination of their care.    Salvatore Decent. Cornelius Moras, MD 04/05/2018 10:49 AM

## 2018-04-05 NOTE — Patient Instructions (Addendum)
Continue all previous medications without any changes at this time  Discuss with your cardiologist whether or not you should remain on Plavix

## 2018-04-27 ENCOUNTER — Ambulatory Visit (INDEPENDENT_AMBULATORY_CARE_PROVIDER_SITE_OTHER): Payer: 59

## 2018-04-27 ENCOUNTER — Other Ambulatory Visit: Payer: Self-pay | Admitting: *Deleted

## 2018-04-27 DIAGNOSIS — I255 Ischemic cardiomyopathy: Secondary | ICD-10-CM

## 2018-04-27 DIAGNOSIS — I5022 Chronic systolic (congestive) heart failure: Secondary | ICD-10-CM

## 2018-04-27 MED ORDER — SACUBITRIL-VALSARTAN 24-26 MG PO TABS: 1.0000 | ORAL_TABLET | Freq: Two times a day (BID) | ORAL | 11 refills | Status: DC

## 2018-04-30 LAB — CUP PACEART REMOTE DEVICE CHECK
Battery Voltage: 3.01 V
Brady Statistic RV Percent Paced: 0.08 %
Date Time Interrogation Session: 20200204214223
HIGH POWER IMPEDANCE MEASURED VALUE: 70 Ohm
Implantable Lead Implant Date: 20190502
Implantable Pulse Generator Implant Date: 20190502
Lead Channel Impedance Value: 304 Ohm
Lead Channel Pacing Threshold Amplitude: 0.75 V
Lead Channel Pacing Threshold Pulse Width: 0.4 ms
Lead Channel Sensing Intrinsic Amplitude: 2.5 mV
MDC IDC LEAD LOCATION: 753860
MDC IDC MSMT BATTERY REMAINING LONGEVITY: 124 mo
MDC IDC MSMT LEADCHNL RV IMPEDANCE VALUE: 342 Ohm
MDC IDC MSMT LEADCHNL RV SENSING INTR AMPL: 2.5 mV
MDC IDC SET LEADCHNL RV PACING AMPLITUDE: 2.5 V
MDC IDC SET LEADCHNL RV PACING PULSEWIDTH: 0.4 ms
MDC IDC SET LEADCHNL RV SENSING SENSITIVITY: 0.3 mV

## 2018-05-03 ENCOUNTER — Encounter (HOSPITAL_COMMUNITY): Payer: Self-pay | Admitting: Cardiology

## 2018-05-03 ENCOUNTER — Ambulatory Visit (HOSPITAL_COMMUNITY)
Admission: RE | Admit: 2018-05-03 | Discharge: 2018-05-03 | Disposition: A | Discharge status: Home / Self Care | Payer: 59 | Source: Ambulatory Visit | Attending: Cardiology | Admitting: Cardiology

## 2018-05-03 VITALS — BP 112/66 | HR 60 | Wt 220.6 lb

## 2018-05-03 DIAGNOSIS — I5022 Chronic systolic (congestive) heart failure: Secondary | ICD-10-CM | POA: Diagnosis not present

## 2018-05-03 DIAGNOSIS — I252 Old myocardial infarction: Secondary | ICD-10-CM | POA: Diagnosis not present

## 2018-05-03 DIAGNOSIS — I6523 Occlusion and stenosis of bilateral carotid arteries: Secondary | ICD-10-CM | POA: Diagnosis not present

## 2018-05-03 DIAGNOSIS — Z79899 Other long term (current) drug therapy: Secondary | ICD-10-CM | POA: Diagnosis not present

## 2018-05-03 DIAGNOSIS — I255 Ischemic cardiomyopathy: Secondary | ICD-10-CM

## 2018-05-03 DIAGNOSIS — Z8249 Family history of ischemic heart disease and other diseases of the circulatory system: Secondary | ICD-10-CM | POA: Diagnosis not present

## 2018-05-03 DIAGNOSIS — Z95810 Presence of automatic (implantable) cardiac defibrillator: Secondary | ICD-10-CM | POA: Diagnosis not present

## 2018-05-03 DIAGNOSIS — I2510 Atherosclerotic heart disease of native coronary artery without angina pectoris: Secondary | ICD-10-CM | POA: Insufficient documentation

## 2018-05-03 DIAGNOSIS — Z951 Presence of aortocoronary bypass graft: Secondary | ICD-10-CM | POA: Insufficient documentation

## 2018-05-03 DIAGNOSIS — Z87891 Personal history of nicotine dependence: Secondary | ICD-10-CM | POA: Diagnosis not present

## 2018-05-03 DIAGNOSIS — Z7902 Long term (current) use of antithrombotics/antiplatelets: Secondary | ICD-10-CM | POA: Insufficient documentation

## 2018-05-03 DIAGNOSIS — Z7982 Long term (current) use of aspirin: Secondary | ICD-10-CM | POA: Diagnosis not present

## 2018-05-03 DIAGNOSIS — I480 Paroxysmal atrial fibrillation: Secondary | ICD-10-CM | POA: Insufficient documentation

## 2018-05-03 LAB — LIPID PANEL
Cholesterol: 139 mg/dL (ref 0–200)
HDL: 44 mg/dL (ref >40)
LDL CALC: 59 mg/dL (ref 0–99)
TRIGLYCERIDES: 178 mg/dL — AB (ref <150)
Total CHOL/HDL Ratio: 3.2 RATIO
VLDL: 36 mg/dL (ref 0–40)

## 2018-05-03 LAB — BASIC METABOLIC PANEL
ANION GAP: 6 (ref 5–15)
BUN: 18 mg/dL (ref 6–20)
CALCIUM: 9.1 mg/dL (ref 8.9–10.3)
CO2: 24 mmol/L (ref 22–32)
Chloride: 109 mmol/L (ref 98–111)
Creatinine, Ser: 1.09 mg/dL (ref 0.61–1.24)
Glucose, Bld: 104 mg/dL — ABNORMAL HIGH (ref 70–99)
Potassium: 4.2 mmol/L (ref 3.5–5.1)
Sodium: 139 mmol/L (ref 135–145)

## 2018-05-03 MED ORDER — CARVEDILOL 12.5 MG PO TABS: 12.5000 mg | ORAL_TABLET | Freq: Two times a day (BID) | ORAL | 6 refills | Status: DC

## 2018-05-03 MED ORDER — SACUBITRIL-VALSARTAN 24-26 MG PO TABS: 1.0000 | ORAL_TABLET | Freq: Two times a day (BID) | ORAL | 11 refills | Status: DC

## 2018-05-03 MED ORDER — SPIRONOLACTONE 25 MG PO TABS: 25.0000 mg | ORAL_TABLET | Freq: Every day | ORAL | 5 refills | Status: DC

## 2018-05-03 MED ORDER — ATORVASTATIN CALCIUM 80 MG PO TABS: 80.0000 mg | ORAL_TABLET | Freq: Every day | ORAL | 5 refills | Status: DC

## 2018-05-03 NOTE — Progress Notes (Signed)
Advanced Heart Failure Clinic Note    Primary Care: Dr. Laymond Purser Primary Cardiologist: Dr. Shirlee Latch   HPI: Ryan Durham is a 63 y.o. 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   Admitted 1/6 -> 04/09/17 with NSTEMI with sudden onset of chest tightness and SOB. Noted to be in rapid Afib on arrival. He spontaneously converted to NSR with treatment of his chest pain. Underwent LHC that showed critical 3vD. IABP placed with cardiogenic shock and TCTS consulted for CABG. Pt underwent CABG 03/31/2017 with no immediate complications. Pressors weaned and extubated as tolerated. Weaned off IABP POD #1. Pt diuresed with IV lasix and HF medications adjusted as tolerated.  Discharge weight 217 lbs.   Medtronic ICD placed in 5/19.   He returns for followup of CHF and CAD.  Generally doing well. Working full time.  He has been noting lightheadedness when standing up, especially over the last couple weeks.  He is actually taking spironolactone 50 mg daily (though he was on 25 mg daily).  No significant exertional dyspnea.  He does have generalized fatigue by the end of the day.  Weight is stable.  No chest pain.    ECG (personally reviewed): NSR, left axis deviation, old anterior MI  Labs (3/19): LDL 56, HDL 26, K 4.3, creatinine 7.37 Labs (5/19): K 5, creatinine 0.94  Labs (8/19): K 4.7, creatinine 1.05 Labs (11/19): K 4.1, creatinine 0.9  Past Medical History 1. Chronic systolic CHF: ECHO 03/30/2017 EF 25-30%. Ischemic cardiomyopathy, now s/p CABG. - Echo (4/19): EF 30-35%, wall motion abnormalities, grade 2 diastolic dysfunction, normal RV size and systolic function.   - Medtronic ICD.  2. TGG:YIRSWN FH premature CAD.NSTEMI 1/19, now s/p CABG 03/2017 3. Atrial fibrillation: Paroxysmal. Noted only in setting of NSTEMI.  4. Smoking: Has quit.  5. Carotid stenosis: Carotid dopplers (11/19) with 40-59% BICA stenosis.   ROS: All systems reviewed  and negative except as per HPI.    Current Outpatient Medications  Medication Sig Dispense Refill  . aspirin EC 81 MG EC tablet Take 1 tablet (81 mg total) by mouth daily.    [name] atorvastatin (LIPITOR) 80 MG tablet Take 1 tablet (80 mg total) by mouth daily at 6 PM. 30 tablet 5  . carvedilol (COREG) 12.5 MG tablet Take 1 tablet (12.5 mg total) by mouth 2 (two) times daily with a meal. 60 tablet 6  . sacubitril-valsartan (ENTRESTO) 24-26 MG Take 1 tablet by mouth 2 (two) times daily. 60 tablet 11  . spironolactone (ALDACTONE) 25 MG tablet Take 1 tablet (25 mg total) by mouth daily. 30 tablet 5   No current facility-administered medications for this encounter.    No Known Allergies  Social History   Socioeconomic History  . Marital status: Married    Spouse name: Not on file  . Number of children: Not on file  . Years of education: Not on file  . Highest education level: Not on file  Occupational History  . Not on file  Social Needs  . Financial resource strain: Not on file  . Food insecurity:    Worry: Not on file    Inability: Not on file  . Transportation needs:    Medical: Not on file    Non-medical: Not on file  Tobacco Use  . Smoking status: Former Smoker    Packs/day: 1.00    Years: 30.00    Pack years: 30.00  Types: Cigarettes  . Smokeless tobacco: Never Used  Substance and Sexual Activity  . Alcohol use: No    Frequency: Never  . Drug use: No  . Sexual activity: Not on file  Lifestyle  . Physical activity:    Days per week: Not on file    Minutes per session: Not on file  . Stress: Not on file  Relationships  . Social connections:    Talks on phone: Not on file    Gets together: Not on file    Attends religious service: Not on file    Active member of club or organization: Not on file    Attends meetings of clubs or organizations: Not on file    Relationship status: Not on file  . Intimate partner violence:    Fear of current or ex partner: Not on  file    Emotionally abused: Not on file    Physically abused: Not on file    Forced sexual activity: Not on file  Other Topics Concern  . Not on file  Social History Narrative   Works in [employer] for [employer]   Family History  Problem Relation Age of Onset  . Cirrhosis Mother   . Heart attack Sister   . Myocarditis Brother    Vitals:   05/03/18 1050  BP: 112/66  Pulse: 60  SpO2: 96%  Weight: 100.1 kg (220 lb 9.6 oz)   Wt Readings from Last 3 Encounters:  05/03/18 100.1 kg (220 lb 9.6 oz)  04/05/18 99.8 kg (220 lb)  01/25/18 99.7 kg (219 lb 12.8 oz)    PHYSICAL EXAM: General: NAD Neck: No JVD, no thyromegaly or thyroid nodule.  Lungs: Clear to auscultation bilaterally with normal respiratory effort. CV: Nondisplaced PMI.  Heart regular S1/S2, no S3/S4, no murmur.  No peripheral edema.  Right carotid bruit.  Normal pedal pulses.  Abdomen: Soft, nontender, no hepatosplenomegaly, no distention.  Skin: Intact without lesions or rashes.  Neurologic: Alert and oriented x 3.  Psych: Normal affect. Extremities: No clubbing or cyanosis.  HEENT: Normal.   ASSESSMENT & PLAN:  1. Chronic systolic CHF: ECHO 03/30/2017 EF 25-30%. Ischemic cardiomyopathy, now s/p CABG as below. Repeat echo in 4/19 with EF still low at 30-35%.  He now has a Medtronic ICD.  NYHA class I-II symptoms. He is not volume overloaded on exam.  - Increase Coreg to 12.5 mg bid.   - Continue Entresto 24/26 bid.   - With orthostatic symptoms, would decrease spironolactone to 25 mg daily.  - BMET today.  - Echo in 3 months with followup appt.  2. ZOX:WRUEAVCAD:Strong FH premature CAD.NSTEMI, now s/p CABG x 5 03/31/2017.  No chest pain.   - Continue ASA 81 and statin. Check lipids today.  - > 1 year post-NSTEMI, he can stop Plavix at this point.  3. Atrial fibrillation: Paroxysmal. Only noted at initial admission in setting of severe CP/NSTEMI. NSR today.  - If recurs, he will need to be anticoagulated  (holding off for now). 4. Smoking: Has not smoked since 1/19.  5. Carotid bruit: Moderate bilateral ICA stenosis.   - Repeat carotid dopplers in 11/20.   Followup in 3 months with echo.    Marca Anconaalton Antonio Woodhams, MD 05/03/18

## 2018-05-03 NOTE — Patient Instructions (Signed)
EKG done today.  DECREASE Spironolactone 25mg  (1 tab) daily  INCREASE Carvedilol 12.5mg  (1 tab) twice daily  STOP Plavix  Your physician has requested that you have an echocardiogram. Echocardiography is a painless test that uses sound waves to create images of your heart. It provides your doctor with information about the size and shape of your heart and how well your heart's chambers and valves are working. This procedure takes approximately one hour. There are no restrictions for this procedure.  Follow up with Dr. Shirlee Latch in 3 months

## 2018-05-06 NOTE — Progress Notes (Signed)
Remote ICD transmission.   

## 2018-07-06 ENCOUNTER — Telehealth (HOSPITAL_COMMUNITY): Payer: Self-pay | Admitting: Cardiology

## 2018-07-06 NOTE — Telephone Encounter (Signed)
Patient called regarding quarantine. Reports he was advised by [facility] clinic last week to begin quarantine as his daughters boyfriend was symptomatic and tested for COVID. Since then family members test has come back negative however was very symptomatic with fever,fatigue and increased SOB. patient remains asymptomatic. Patient would like to know if he should report back to work. Advised he should complete 14 day quarantine, continue to monitor for symptoms to air on the side of precaution given cardiac history.

## 2018-07-23 ENCOUNTER — Other Ambulatory Visit (HOSPITAL_COMMUNITY): Payer: Self-pay | Admitting: Cardiology

## 2018-07-26 ENCOUNTER — Telehealth: Payer: Self-pay | Admitting: *Deleted

## 2018-07-28 NOTE — Telephone Encounter (Signed)
Calling patient today to discuss upcoming appointment.  We are currently trying to limit exposure to the virus that causes COVID-19 by seeing patients at home rather than in the office. We would like to schedule this appointment as a Virtual Appointment VIA Smartphone or Laptop. Unable to reach patient.  LVMTCB  

## 2018-07-29 NOTE — Telephone Encounter (Signed)
Follow Up: ° ° ° °Returning your call from yesterday. °

## 2018-07-29 NOTE — Telephone Encounter (Signed)
LVMTCB

## 2018-07-30 NOTE — Telephone Encounter (Signed)
Wife  Of pt called back returning your call

## 2018-08-02 NOTE — Telephone Encounter (Signed)
Follow up     Pts wife is returning call   Please call back  

## 2018-08-02 NOTE — Telephone Encounter (Signed)
LMTCB  Sent Message about appointment through MyChart

## 2018-08-02 NOTE — Telephone Encounter (Signed)
Patient will be using WebEx on laptop.      Virtual Visit Pre-Appointment Phone Call  "(Name), I am calling you today to discuss your upcoming appointment. We are currently trying to limit exposure to the virus that causes COVID-19 by seeing patients at home rather than in the office."  1. "What is the BEST phone number to call the day of the visit?" - include this in appointment notes  2. "Do you have or have access to (through a family member/friend) a smartphone with video capability that we can use for your visit?" a. If yes - list this number in appt notes as "cell" (if different from BEST phone #) and list the appointment type as a VIDEO visit in appointment notes b. If no - list the appointment type as a PHONE visit in appointment notes  Confirm consent - "In the setting of the current Covid19 crisis, you are scheduled for a (phone or video) visit with your provider on (date) at (time).  Just as we do with many in-office visits, in order for you to participate in this visit, we must obtain consent.  If you'd like, I can send this to your mychart (if signed up) or email for you to review.  Otherwise, I can obtain your verbal consent now.  All virtual visits are billed to your insurance company just like a normal visit would be.  By agreeing to a virtual visit, we'd like you to understand that the technology does not allow for your provider to perform an examination, and thus may limit your provider's ability to fully assess your condition. If your provider identifies any concerns that need to be evaluated in person, we will make arrangements to do so.  Finally, though the technology is pretty good, we cannot assure that it will always work on either your or our end, and in the setting of a video visit, we may have to convert it to a phone-only visit.  In either situation, we cannot ensure that we have a secure connection.  Are you willing to proceed?" STAFF: Did the patient verbally acknowledge  consent to telehealth visit? Document YES/NO here: YES  3. Advise patient to be prepared - "Two hours prior to your appointment, go ahead and check your blood pressure, pulse, oxygen saturation, and your weight (if you have the equipment to check those) and write them all down. When your visit starts, your provider will ask you for this information. If you have an Apple Watch or Kardia device, please plan to have heart rate information ready on the day of your appointment. Please have a pen and paper handy nearby the day of the visit as well."  4. Give patient instructions for MyChart download to smartphone OR Doximity/Doxy.me as below if video visit (depending on what platform provider is using)  5. Inform patient they will receive a phone call 15 minutes prior to their appointment time (may be from unknown caller ID) so they should be prepared to answer    TELEPHONE CALL NOTE  Ryan Durham has been deemed a candidate for a follow-up tele-health visit to limit community exposure during the Covid-19 pandemic. I spoke with the patient via phone to ensure availability of phone/video source, confirm preferred email & phone number, and discuss instructions and expectations.  I reminded Ryan Durham to be prepared with any vital sign and/or heart rhythm information that could potentially be obtained via home monitoring, at the time of his visit. I reminded Ryan Durham to  expect a phone call prior to his visit.  Lenor Provencher 08/02/2018 10:40 AM   INSTRUCTIONS FOR DOWNLOADING THE MYCHART APP TO SMARTPHONE  - The patient must first make sure to have activated MyChart and know their login information - If Apple, go to [employer] and type in MyChart in the search bar and download the app. If Android, ask patient to go to [employer] and type in [place] in the search bar and download the app. The app is free but as with any other app downloads, their phone may require them to verify saved  payment information or Apple/Android password.  - The patient will need to then log into the app with their MyChart username and password, and select [facility] as their healthcare provider to link the account. When it is time for your visit, go to the MyChart app, find appointments, and click Begin Video Visit. Be sure to Select Allow for your device to access the Microphone and Camera for your visit. You will then be connected, and your provider will be with you shortly.  **If they have any issues connecting, or need assistance please contact MyChart service desk [phone])**  **If using a computer, in order to ensure the best quality for their visit they will need to use either of the following Internet Browsers: [employer] or Google Chrome**  IF USING DOXIMITY or DOXY.ME - The patient will receive a link just prior to their visit by text.     FULL LENGTH CONSENT FOR TELE-HEALTH VISIT   I hereby voluntarily request, consent and authorize [facility] and its employed or contracted physicians, physician assistants, nurse practitioners or other licensed health care professionals (the Practitioner), to provide me with telemedicine health care services (the "Services") as deemed necessary by the treating Practitioner. I acknowledge and consent to receive the Services by the Practitioner via telemedicine. I understand that the telemedicine visit will involve communicating with the Practitioner through live audiovisual communication technology and the disclosure of certain medical information by electronic transmission. I acknowledge that I have been given the opportunity to request an in-person assessment or other available alternative prior to the telemedicine visit and am voluntarily participating in the telemedicine visit.  I understand that I have the right to withhold or withdraw my consent to the use of telemedicine in the course of my care at any time, without affecting  my right to future care or treatment, and that the Practitioner or I may terminate the telemedicine visit at any time. I understand that I have the right to inspect all information obtained and/or recorded in the course of the telemedicine visit and may receive copies of available information for a reasonable fee.  I understand that some of the potential risks of receiving the Services via telemedicine include:  [name] Delay or interruption in medical evaluation due to technological equipment failure or disruption; . Information transmitted may not be sufficient (e.g. poor resolution of images) to allow for appropriate medical decision making by the Practitioner; and/or  . In rare instances, security protocols could fail, causing a breach of personal health information.  Furthermore, I acknowledge that it is my responsibility to provide information about my medical history, conditions and care that is complete and accurate to the best of my ability. I acknowledge that Practitioner's advice, recommendations, and/or decision may be based on factors not within their control, such as incomplete or inaccurate data provided by me or distortions of diagnostic images or specimens that may  result from electronic transmissions. I understand that the practice of medicine is not an exact science and that Practitioner makes no warranties or guarantees regarding treatment outcomes. I acknowledge that I will receive a copy of this consent concurrently upon execution via email to the email address I last provided but may also request a printed copy by calling the office of [facility].    I understand that my insurance will be billed for this visit.   I have read or had this consent read to me. . I understand the contents of this consent, which adequately explains the benefits and risks of the Services being provided via telemedicine.  . I have been provided ample opportunity to ask questions regarding this consent and the  Services and have had my questions answered to my satisfaction. . I give my informed consent for the services to be provided through the use of telemedicine in my medical care  By participating in this telemedicine visit I agree to the above.

## 2018-08-03 ENCOUNTER — Telehealth (INDEPENDENT_AMBULATORY_CARE_PROVIDER_SITE_OTHER): Payer: 59 | Admitting: Cardiology

## 2018-08-03 ENCOUNTER — Other Ambulatory Visit: Payer: Self-pay

## 2018-08-03 ENCOUNTER — Ambulatory Visit (INDEPENDENT_AMBULATORY_CARE_PROVIDER_SITE_OTHER): Payer: 59 | Admitting: *Deleted

## 2018-08-03 DIAGNOSIS — I255 Ischemic cardiomyopathy: Secondary | ICD-10-CM

## 2018-08-03 DIAGNOSIS — I5022 Chronic systolic (congestive) heart failure: Secondary | ICD-10-CM

## 2018-08-03 NOTE — Progress Notes (Signed)
Electrophysiology TeleHealth Note   Due to national recommendations of social distancing due to COVID 19, an audio/video telehealth visit is felt to be most appropriate for this patient at this time.  See Epic message for the patient's consent to telehealth for [facility].   Date:  08/03/2018   ID:  Ryan Durham, DOB [birth year], MRN [id number]  Location: patient's home  Provider location: [address], [place]  Evaluation Performed: Follow-up visit  PCP:  Shelle Iron, MD  Cardiologist:  Shirlee Latch  Electrophysiologist:  Dr Elberta Fortis  Chief Complaint:  CHF  History of Present Illness:    Ryan Durham is a 63 y.o. male who presents via audio/video conferencing for a telehealth visit today.  Since last being seen in our clinic, the patient reports doing very well.  Today, he denies symptoms of palpitations, chest pain, shortness of breath,  lower extremity edema, dizziness, presyncope, or syncope.  The patient is otherwise without complaint today.  The patient denies symptoms of fevers, chills, cough, or new SOB worrisome for COVID 19.  He has a history of coronary artery disease status post CABG 04/12/2017, systolic heart failure secondary to ischemic cardiomyopathy, atrial fibrillation in the setting of his STEMI, and tobacco abuse.  Today, denies symptoms of palpitations, chest pain, shortness of breath, orthopnea, PND, lower extremity edema, claudication, dizziness, presyncope, syncope, bleeding, or neurologic sequela. The patient is tolerating medications without difficulties.  Overall he is doing well.  He has no chest pain or shortness of breath.  He is able to do all of his daily activities without restriction.  Past Medical History:  Diagnosis Date   Acute on chronic systolic heart failure (HCC) 03/29/2017   Anginal pain (HCC)    this admission   Cardiomyopathy, ischemic 03/30/2017   Coronary artery disease    this admission   Coronary artery disease  involving native coronary artery of native heart with unstable angina pectoris (HCC) 03/29/2017   Dyspnea    this admission   Dysrhythmia    afib with this admission/runs of VTACH/Afib both with this admission   Hyperlipidemia    Myocardial infarction [facility])    this admission   Paroxysmal atrial fibrillation (HCC) 03/29/2017   Tobacco abuse    Type II diabetes mellitus (HCC) 03/30/2017    Past Surgical History:  Procedure Laterality Date   CLIPPING OF ATRIAL APPENDAGE Left 03/31/2017   Procedure: CLIPPING OF ATRIAL APPENDAGE;  Surgeon: Purcell Nails, MD;  Location: [facility] OR;  Service: Open Heart Surgery;  Laterality: Left;   CORONARY ANGIOPLASTY  03/30/2017   CORONARY ARTERY BYPASS GRAFT N/A 03/31/2017   Procedure: CORONARY ARTERY BYPASS GRAFTING (CABG) time 5 using bilateral saphaneous vien, harvested endoscopicly and right internal mammary artery.;  Surgeon: Purcell Nails, MD;  Location: [facility] OR;  Service: Open Heart Surgery;  Laterality: N/A;   CORONARY/GRAFT ACUTE MI REVASCULARIZATION N/A 03/29/2017   Procedure: Coronary/Graft Acute MI Revascularization;  Surgeon: Kathleene Hazel, MD;  Location: [facility] INVASIVE CV LAB;  Service: Cardiovascular;  Laterality: N/A;   IABP INSERTION N/A 03/29/2017   Procedure: IABP Insertion;  Surgeon: Kathleene Hazel, MD;  Location: [facility] INVASIVE CV LAB;  Service: Cardiovascular;  Laterality: N/A;   ICD IMPLANT N/A 07/23/2017   Procedure: ICD IMPLANT;  Surgeon: Regan Lemming, MD;  Location: [facility] INVASIVE CV LAB;  Service: Cardiovascular;  Laterality: N/A;   LEFT HEART CATH AND CORONARY ANGIOGRAPHY N/A 03/29/2017   Procedure: LEFT HEART CATH AND CORONARY ANGIOGRAPHY;  Surgeon: Kathleene HazelMcAlhany, Christopher D, MD;  [facility];  Service: Cardiovascular;  Laterality: N/A;   TEE WITHOUT CARDIOVERSION N/A 03/31/2017   Procedure: TRANSESOPHAGEAL ECHOCARDIOGRAM (TEE);  Surgeon: Purcell Nailswen, Clarence H, MD;  [facility];  Service: Open Heart  Surgery;  Laterality: N/A;    Current Outpatient Medications  Medication Sig Dispense Refill   aspirin EC 81 MG EC tablet Take 1 tablet (81 mg total) by mouth daily.     atorvastatin (LIPITOR) 80 MG tablet Take 1 tablet (80 mg total) by mouth daily at 6 PM. 30 tablet 5   carvedilol (COREG) 12.5 MG tablet Take 1 tablet (12.5 mg total) by mouth 2 (two) times daily with a meal. 180 tablet 3   sacubitril-valsartan (ENTRESTO) 24-26 MG Take 1 tablet by mouth 2 (two) times daily. 60 tablet 11   spironolactone (ALDACTONE) 25 MG tablet Take 1 tablet (25 mg total) by mouth daily. 30 tablet 5   carvedilol (COREG) 12.5 MG tablet Take 1 tablet (12.5 mg total) by mouth 2 (two) times daily with a meal. (Patient not taking: Reported on 08/03/2018) 60 tablet 6   No current facility-administered medications for this visit.     Allergies:   Patient has no known allergies.   Social History:  The patient  reports that he has quit smoking. His smoking use included cigarettes. He has a 30.00 pack-year smoking history. He has never used smokeless tobacco. He reports that he does not drink alcohol or use drugs.   Family History:  The patient's  family history includes Cirrhosis in his mother; Heart attack in his sister; Myocarditis in his brother.   ROS:  Please see the history of present illness.   All other systems are personally reviewed and negative.    Exam:    Vital Signs:  BP (!) 88/60    Pulse 60    Wt 215 lb (97.5 kg)    BMI 30.85 kg/m   Well appearing, alert and conversant, regular work of breathing,  good skin color Eyes- anicteric, neuro- grossly intact, skin- no apparent rash or lesions or cyanosis, mouth- oral mucosa is pink   Labs/Other Tests and Data Reviewed:    Recent Labs: 05/03/2018: BUN 18; Creatinine, Ser 1.09; Potassium 4.2; Sodium 139   Wt Readings from Last 3 Encounters:  08/03/18 215 lb (97.5 kg)  05/03/18 220 lb 9.6 oz (100.1 kg)  04/05/18 220 lb (99.8 kg)     Other  studies personally reviewed: Additional studies/ records that were reviewed today include: ECG 05/03/2018 personally reviewed Review of the above records today demonstrates: Sinus rhythm, PVCs, septal infarct   Last device remote is reviewed from [employer] PDF dated 04/30/2018 which reveals normal device function, no arrhythmias    ASSESSMENT & PLAN:    1.  Chronic systolic heart failure due to ischemic cardiomyopathy: Status post Saint Jude ICD implanted 07/23/2017.  Currently on optimal medical therapy.  Device functioning appropriately.  His blood pressure is low on his checks today, though I am not convinced that that is entirely accurate as his blood pressure has been quite a bit higher in clinic.  No changes.  2.  Coronary artery disease status post 5 vessel CABG: Continue aspirin, Plavix, statin.  No current chest pain  3.  Atrial fibrillation, paroxysmal: None seen on his device.  This was around the time of his MI.  If he has more Tequita Marrs require anticoagulation.   COVID 19 screen The patient denies symptoms of COVID  19 at this time.  The importance of social distancing was discussed today.  Follow-up: 1 year  Current medicines are reviewed at length with the patient today.   The patient does not have concerns regarding his medicines.  The following changes were made today:  none  Labs/ tests ordered today include:  No orders of the defined types were placed in this encounter.    Patient Risk:  after full review of this patients clinical status, I feel that they are at moderate risk at this time.  Today, I have spent 12 minutes with the patient with telehealth technology discussing systolic heart failure, ICD, coronavirus.    Signed, Jonesha Tsuchiya Jorja Loa, MD  08/03/2018 3:23 PM     [facility] [address] [place] [address] [phone] (office) [phone] (fax)

## 2018-08-04 LAB — CUP PACEART REMOTE DEVICE CHECK
Battery Remaining Longevity: 122 mo
Battery Voltage: 3.01 V
Brady Statistic RV Percent Paced: 0.05 %
Date Time Interrogation Session: 20200512083724
HighPow Impedance: 65 Ohm
Implantable Lead Implant Date: 20190502
Implantable Lead Location: 753860
Implantable Pulse Generator Implant Date: 20190502
Lead Channel Impedance Value: 266 Ohm
Lead Channel Impedance Value: 323 Ohm
Lead Channel Pacing Threshold Amplitude: 1 V
Lead Channel Pacing Threshold Pulse Width: 0.4 ms
Lead Channel Sensing Intrinsic Amplitude: 3.625 mV
Lead Channel Sensing Intrinsic Amplitude: 3.625 mV
Lead Channel Setting Pacing Amplitude: 2.5 V
Lead Channel Setting Pacing Pulse Width: 0.4 ms
Lead Channel Setting Sensing Sensitivity: 0.3 mV

## 2018-08-09 ENCOUNTER — Other Ambulatory Visit: Payer: Self-pay

## 2018-08-09 ENCOUNTER — Encounter (HOSPITAL_COMMUNITY): Payer: 59 | Admitting: Cardiology

## 2018-08-09 ENCOUNTER — Ambulatory Visit (HOSPITAL_COMMUNITY)
Admission: RE | Admit: 2018-08-09 | Discharge: 2018-08-09 | Disposition: A | Discharge status: Home / Self Care | Payer: 59 | Source: Ambulatory Visit | Attending: Cardiology | Admitting: Cardiology

## 2018-08-09 DIAGNOSIS — E785 Hyperlipidemia, unspecified: Secondary | ICD-10-CM | POA: Diagnosis not present

## 2018-08-09 DIAGNOSIS — I255 Ischemic cardiomyopathy: Secondary | ICD-10-CM | POA: Insufficient documentation

## 2018-08-09 DIAGNOSIS — F17200 Nicotine dependence, unspecified, uncomplicated: Secondary | ICD-10-CM | POA: Diagnosis not present

## 2018-08-09 DIAGNOSIS — E119 Type 2 diabetes mellitus without complications: Secondary | ICD-10-CM | POA: Insufficient documentation

## 2018-08-09 DIAGNOSIS — I509 Heart failure, unspecified: Secondary | ICD-10-CM | POA: Insufficient documentation

## 2018-08-09 DIAGNOSIS — I2510 Atherosclerotic heart disease of native coronary artery without angina pectoris: Secondary | ICD-10-CM | POA: Insufficient documentation

## 2018-08-09 NOTE — Progress Notes (Signed)
  Echocardiogram 2D Echocardiogram has been performed.  Ryan Durham 08/09/2018, 11:48 AM

## 2018-08-11 IMAGING — DX DG CHEST 1V PORT
1 series · 1 of 1 positions shown · non-contrast
Comparison: March 31, 2017

CLINICAL DATA: Hypoxia

EXAM:
PORTABLE CHEST 1 VIEW

[chest]
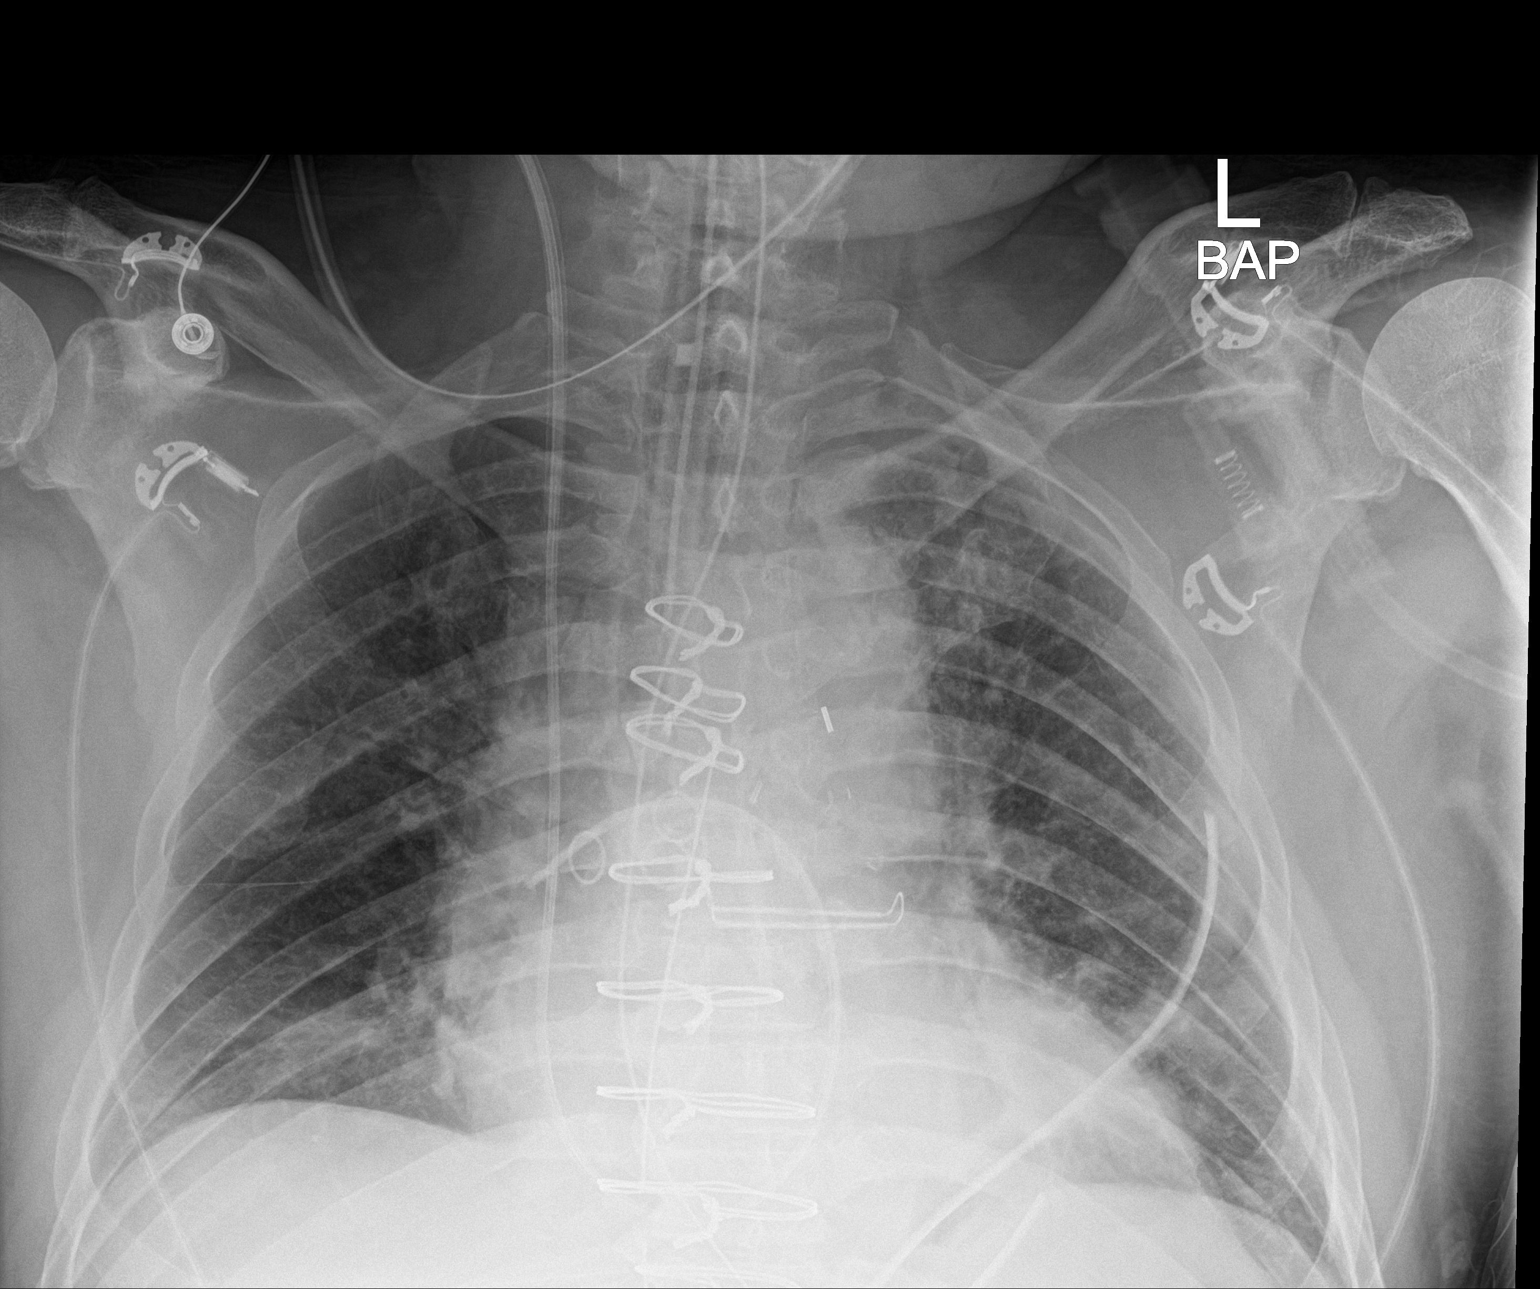

[1 of 1 positions shown; findings below may reference images not displayed]

FINDINGS: Endotracheal tube tip is 3.5 cm above the carina. Swan-Ganz catheter
tip is in the right main pulmonary outflow tract distally.
Nasogastric tube tip and side port are below the diaphragm. There is
a left chest tube and a mediastinal drain. Intra-aortic balloon pump
tip is in the proximal descending thoracic aorta, unchanged. No
pneumothorax.

Patient is status post coronary artery bypass grafting. There is a
left atrial appendage clamp. There is mild atelectasis in the left
base. The lungs elsewhere are clear. Heart is mildly enlarged with
pulmonary vascularity within normal limits. No adenopathy. No bone
lesions.
IMPRESSION: Tube and catheter positions as described without evident
pneumothorax. Left base atelectasis. Lungs elsewhere clear. Stable
cardiac size and contour.

## 2018-08-12 ENCOUNTER — Encounter (HOSPITAL_COMMUNITY): Payer: Self-pay

## 2018-08-12 ENCOUNTER — Other Ambulatory Visit: Payer: Self-pay

## 2018-08-12 ENCOUNTER — Ambulatory Visit (HOSPITAL_COMMUNITY)
Admission: RE | Admit: 2018-08-12 | Discharge: 2018-08-12 | Disposition: A | Discharge status: Home / Self Care | Payer: 59 | Source: Ambulatory Visit | Attending: Cardiology | Admitting: Cardiology

## 2018-08-12 DIAGNOSIS — I25110 Atherosclerotic heart disease of native coronary artery with unstable angina pectoris: Secondary | ICD-10-CM

## 2018-08-12 DIAGNOSIS — I255 Ischemic cardiomyopathy: Secondary | ICD-10-CM | POA: Diagnosis not present

## 2018-08-12 NOTE — Progress Notes (Signed)
Called PCP to get BMET results. Results to be faxed to [place].

## 2018-08-12 NOTE — Patient Instructions (Signed)
Please follow up with Dr. Shirlee Latch in 2 months. Please bring your blood pressure cuff with you to your next visit.

## 2018-08-13 NOTE — Progress Notes (Signed)
Heart Failure TeleHealth Note  Due to national recommendations of social distancing due to COVID 19, Audio/video telehealth visit is felt to be most appropriate for this patient at this time.  See MyChart message from today for patient consent regarding telehealth for [facility].  Date:  08/13/2018   ID:  Ryan Durham, DOB [birth year], MRN [id number]  Location: Home  Provider location: [place] [place] [facility] Type of Visit: Established patient  PCP:  Shelle Iron, MD  Cardiologist:  Dr. Shirlee Latch  Chief Complaint: Fatigue   History of Present Illness: Ryan Durham is a 63 y.o. male who presents via audio/video conferencing for a telehealth visit today.     he denies symptoms worrisome for COVID 19.   Patient has a history of CAD s/p CABG x 5 03/2017, systolic CHF 2/2 ICM, Afib in setting of STEMI, and tobacco abuse.   Admitted 1/6 -> 04/09/17 with NSTEMI with sudden onset of chest tightness and SOB. Noted to be in rapid Afib on arrival. He spontaneously converted to NSR with treatment of his chest pain. Underwent LHC that showed critical 3vD. IABP placed with cardiogenic shock and TCTS consulted for CABG. Pt underwent CABG 03/31/2017 with no immediate complications. Pressors weaned and extubated as tolerated. Weaned off IABP POD #1. Pt diuresed with IV lasix and HF medications adjusted as tolerated.  Discharge weight 217 lbs.   Medtronic ICD placed in 5/19. Echo in 5/20 with EF 20-25%.   He is doing well symptomatically.  Works full time. No chest pain.  No significant exertional dyspnea, orthopnea, or PND.  BP is soft on his home cuff, 93/53 today, but he denies orthostatic-type symptoms. Weight has been stable.     Labs (3/19): LDL 56, HDL 26, K 4.3, creatinine 5.99 Labs (5/19): K 5, creatinine 0.94  Labs (8/19): K 4.7, creatinine 1.05 Labs (11/19): K 4.1, creatinine 0.9 Labs (2/20): LDL 59, HDl 44, K 4.2, ceatinine 1.09  Past Medical History 1. Chronic  systolic CHF: ECHO 03/30/2017 EF 25-30%. Ischemic cardiomyopathy, now s/p CABG. - Echo (4/19): EF 30-35%, wall motion abnormalities, grade 2 diastolic dysfunction, normal RV size and systolic function.  - Medtronic ICD.  - Echo (5/20): EF 20-25%, normal RV size and systolic function.  2. HFS:FSELTR FH premature CAD.NSTEMI 1/19, now s/p CABG 03/2017 3. Atrial fibrillation: Paroxysmal. Noted only in setting of NSTEMI.  4. Smoking: Has quit.  5. Carotid stenosis: Carotid dopplers (11/19) with 40-59% BICA stenosis.   Current Outpatient Medications  Medication Sig Dispense Refill  . aspirin EC 81 MG EC tablet Take 1 tablet (81 mg total) by mouth daily.    [name] atorvastatin (LIPITOR) 80 MG tablet Take 1 tablet (80 mg total) by mouth daily at 6 PM. 30 tablet 5  . carvedilol (COREG) 12.5 MG tablet Take 1 tablet (12.5 mg total) by mouth 2 (two) times daily with a meal. (Patient not taking: Reported on 08/03/2018) 60 tablet 6  . carvedilol (COREG) 12.5 MG tablet Take 1 tablet (12.5 mg total) by mouth 2 (two) times daily with a meal. 180 tablet 3  . sacubitril-valsartan (ENTRESTO) 24-26 MG Take 1 tablet by mouth 2 (two) times daily. 60 tablet 11  . spironolactone (ALDACTONE) 25 MG tablet Take 1 tablet (25 mg total) by mouth daily. 30 tablet 5   No current facility-administered medications for this encounter.     Allergies:   Patient has no known allergies.   Social History:  The patient  reports that he  has quit smoking. His smoking use included cigarettes. He has a 30.00 pack-year smoking history. He has never used smokeless tobacco. He reports that he does not drink alcohol or use drugs.   Family History:  The patient's family history includes Cirrhosis in his mother; Heart attack in his sister; Myocarditis in his brother.   ROS:  Please see the history of present illness.   All other systems are personally reviewed and negative.   Exam:  (Video/Tele Health Call; Exam is subjective and or/visual.)  General:  Speaks in full sentences. No resp difficulty. Lungs: Normal respiratory effort with conversation.  Abdomen: Non-distended per patient report Extremities: Pt denies edema. Neuro: Alert & oriented x 3.   Recent Labs: 05/03/2018: BUN 18; Creatinine, Ser 1.09; Potassium 4.2; Sodium 139  Personally reviewed   Wt Readings from Last 3 Encounters:  08/03/18 97.5 kg (215 lb)  05/03/18 100.1 kg (220 lb 9.6 oz)  04/05/18 99.8 kg (220 lb)      ASSESSMENT AND PLAN:  1. Chronic systolic CHF: ECHO 03/30/2017 EF 25-30%. Ischemic cardiomyopathy, now s/p CABG as below. Repeat echo in 4/19 with EF still low at 30-35% and echo in 5/20 showed EF 20-25%.  He now has a Medtronic ICD.  NYHA class I-II symptoms. He is not volume overloaded on exam. BP is low on his home cuff but he denies lightheadedness.  I am not sure that his cuff is accurate, but I will not increase his meds today.  - Continue Coreg 12.5 mg bid.   - Continue Entresto 24/26 bid.   - Continue spironolactone 25 mg daily.  - I will get recent BMET from PCP.  - Bring BP cuff to followup, will need to calibrate against our BP cuff here.   2. ZOX:WRUEAVCAD:Strong FH premature CAD.NSTEMI, now s/p CABG x 5 03/31/2017.  No chest pain.   - Continue ASA 81 and statin, good lipids 2/20.  3. Atrial fibrillation: Paroxysmal. Only noted at initial admission in setting of severe CP/NSTEMI.No palpitations.  - If recurs, he will need to be anticoagulated (holding off for now). 4. Smoking: Has not smoked since 1/19.  5. Carotid bruit: Moderate bilateral ICA stenosis.   - Repeat carotid dopplers in 11/20.   COVID screen The patient does not have any symptoms that suggest any further testing/ screening at this time.  Social distancing reinforced today.  Patient Risk: After full review of this patients clinical status, I feel that they are at moderate risk for cardiac decompensation at this time.  Relevant cardiac medications were reviewed at length with  the patient today. The patient does not have concerns regarding their medications at this time.   Recommended follow-up:  2 months  Today, I have spent 16 minutes with the patient with telehealth technology discussing the above issues .    Signed, Marca Anconaalton McLean, MD  08/13/2018  [facility] [address] [facility] [place] [phone] (office) [phone] (fax)

## 2018-08-15 IMAGING — DX DG CHEST 2V
2 series · 2 of 2 positions shown · non-contrast
Comparison: One-view chest x-ray 04/03/2017.

CLINICAL DATA: Congestive heart failure. Five vessel CABG 4 days
ago.

EXAM:
CHEST  2 VIEW

[chest lat]
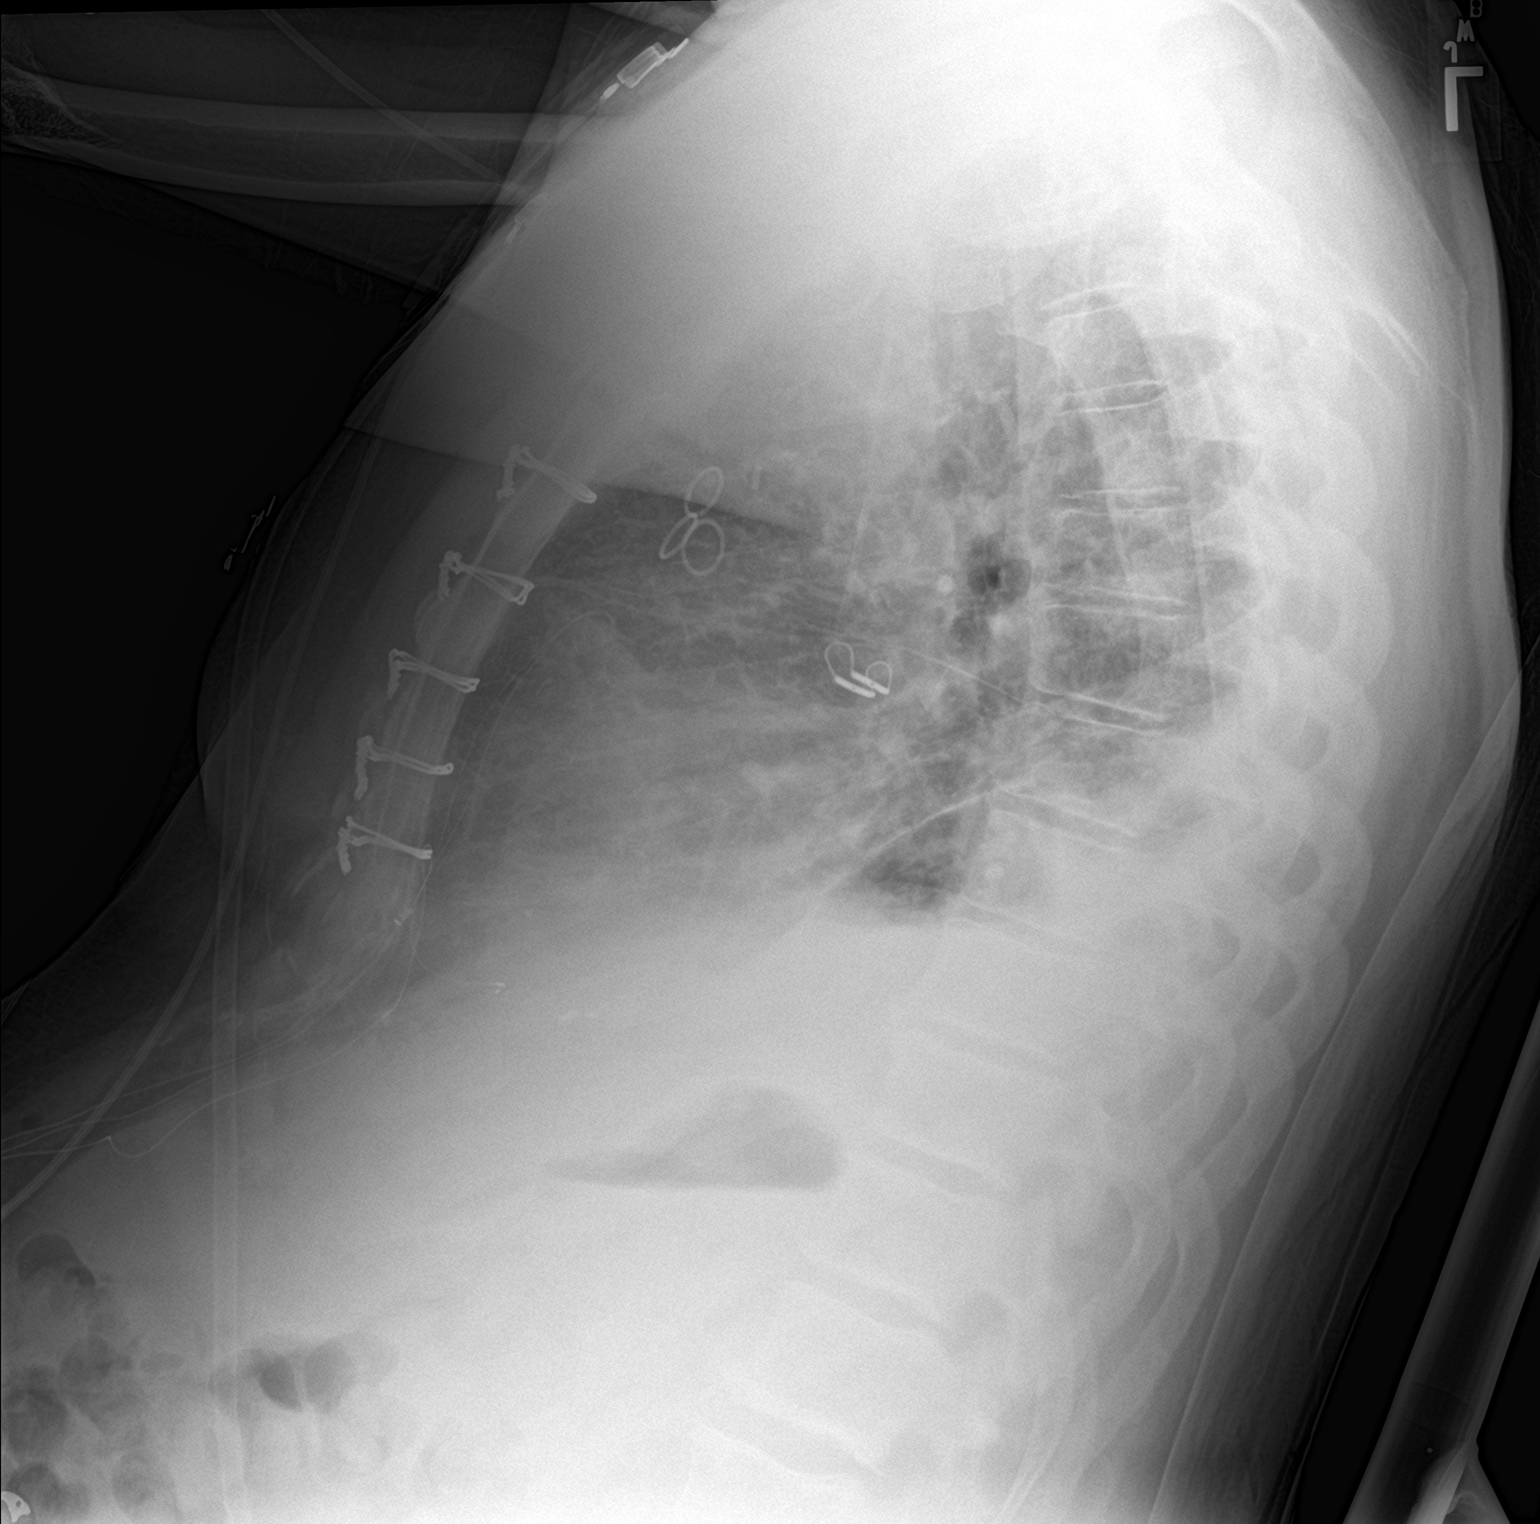

[chest ap]
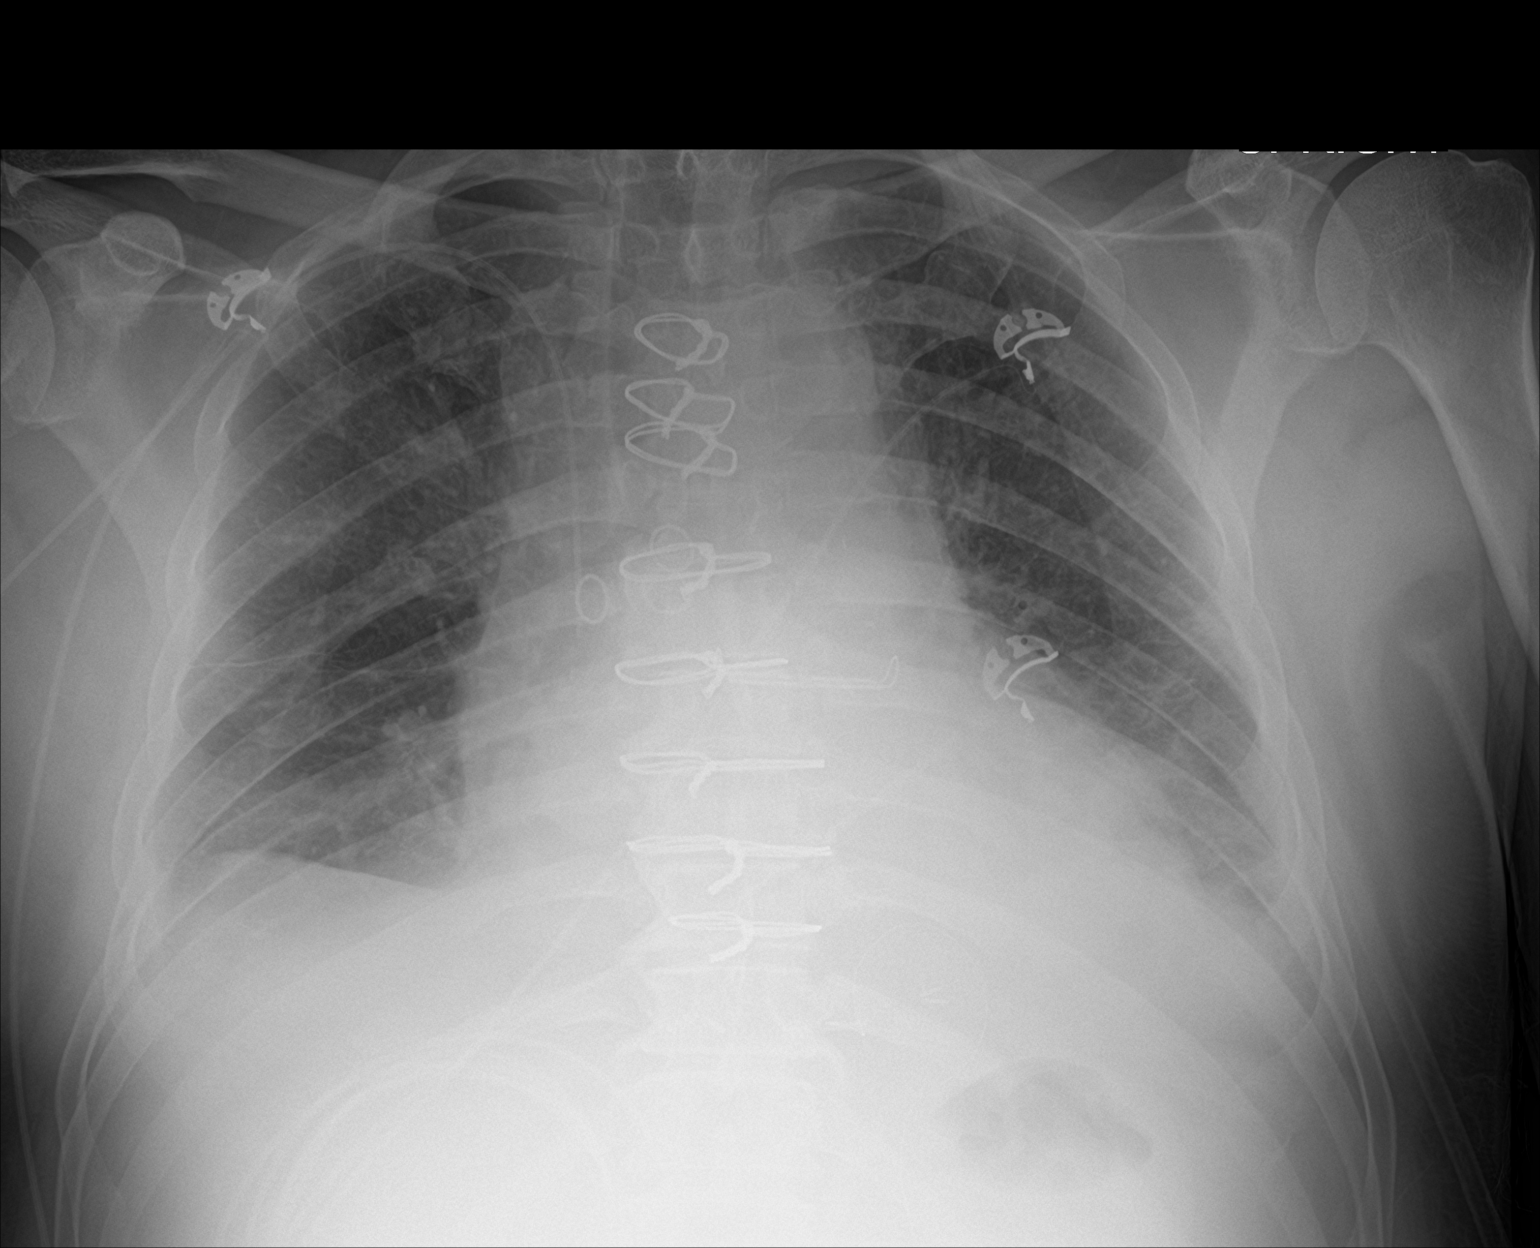

[2 of 2 positions shown; findings below may reference images not displayed]

FINDINGS: The heart is enlarged. A right IJ sheath was removed. A right-sided
PICC line is in place. The tip is at the cavoatrial junction.

Lung volumes remain low. Aeration is improved. Bilateral pleural
effusions and basilar airspace disease remain, left greater than
right. This likely reflects atelectasis.
IMPRESSION: 1. Continued improvement in aeration with persistent low lung
volumes.
2. Left greater than right basilar airspace disease and effusions.
This likely reflects atelectasis.
3. Interval placement of right-sided PICC line.

## 2018-08-17 ENCOUNTER — Other Ambulatory Visit (HOSPITAL_COMMUNITY): Payer: Self-pay | Admitting: Cardiology

## 2018-08-20 NOTE — Progress Notes (Signed)
Remote ICD transmission.   

## 2018-08-27 ENCOUNTER — Other Ambulatory Visit (HOSPITAL_COMMUNITY): Payer: Self-pay | Admitting: Cardiology

## 2018-11-01 ENCOUNTER — Ambulatory Visit (HOSPITAL_COMMUNITY)
Admission: RE | Admit: 2018-11-01 | Discharge: 2018-11-01 | Disposition: A | Discharge status: Home / Self Care | Payer: 59 | Source: Ambulatory Visit | Attending: Cardiology | Admitting: Cardiology

## 2018-11-01 ENCOUNTER — Other Ambulatory Visit: Payer: Self-pay

## 2018-11-01 ENCOUNTER — Encounter (HOSPITAL_COMMUNITY): Payer: Self-pay | Admitting: Cardiology

## 2018-11-01 VITALS — BP 98/70 | HR 63 | Wt 217.8 lb

## 2018-11-01 DIAGNOSIS — Z79899 Other long term (current) drug therapy: Secondary | ICD-10-CM | POA: Diagnosis not present

## 2018-11-01 DIAGNOSIS — Z7982 Long term (current) use of aspirin: Secondary | ICD-10-CM | POA: Diagnosis not present

## 2018-11-01 DIAGNOSIS — I2510 Atherosclerotic heart disease of native coronary artery without angina pectoris: Secondary | ICD-10-CM | POA: Insufficient documentation

## 2018-11-01 DIAGNOSIS — I6523 Occlusion and stenosis of bilateral carotid arteries: Secondary | ICD-10-CM | POA: Diagnosis not present

## 2018-11-01 DIAGNOSIS — Z95810 Presence of automatic (implantable) cardiac defibrillator: Secondary | ICD-10-CM | POA: Diagnosis not present

## 2018-11-01 DIAGNOSIS — I252 Old myocardial infarction: Secondary | ICD-10-CM | POA: Insufficient documentation

## 2018-11-01 DIAGNOSIS — Z8249 Family history of ischemic heart disease and other diseases of the circulatory system: Secondary | ICD-10-CM | POA: Insufficient documentation

## 2018-11-01 DIAGNOSIS — Z951 Presence of aortocoronary bypass graft: Secondary | ICD-10-CM | POA: Insufficient documentation

## 2018-11-01 DIAGNOSIS — I5022 Chronic systolic (congestive) heart failure: Secondary | ICD-10-CM | POA: Insufficient documentation

## 2018-11-01 DIAGNOSIS — I480 Paroxysmal atrial fibrillation: Secondary | ICD-10-CM | POA: Diagnosis not present

## 2018-11-01 DIAGNOSIS — Z87891 Personal history of nicotine dependence: Secondary | ICD-10-CM | POA: Insufficient documentation

## 2018-11-01 DIAGNOSIS — I255 Ischemic cardiomyopathy: Secondary | ICD-10-CM | POA: Insufficient documentation

## 2018-11-01 LAB — BASIC METABOLIC PANEL
Anion gap: 10 (ref 5–15)
BUN: 25 mg/dL — ABNORMAL HIGH (ref 6–20)
CO2: 21 mmol/L — ABNORMAL LOW (ref 22–32)
Calcium: 9.2 mg/dL (ref 8.9–10.3)
Chloride: 105 mmol/L (ref 98–111)
Creatinine, Ser: 1.27 mg/dL — ABNORMAL HIGH (ref 0.61–1.24)
GFR calc Af Amer: >60 mL/min (ref >60)
GFR calc non Af Amer: >60 mL/min (ref >60)
Glucose, Bld: 118 mg/dL — ABNORMAL HIGH (ref 70–99)
Potassium: 4.9 mmol/L (ref 3.5–5.1)
Sodium: 136 mmol/L (ref 135–145)

## 2018-11-01 NOTE — Progress Notes (Signed)
Date:  11/01/2018   ID:  [name], DOB 03/28/1955, MRN [id number]   Provider location: [place] [facility] Type of Visit: Established patient  PCP:  Charlotte Sanes, MD  Cardiologist:  Dr. Aundra Dubin   History of Present Illness: Ryan Durham is a 63 y.o. male who has a history of CAD s/p CABG x 5 07/4268, systolic CHF 2/2 ICM, Afib in setting of STEMI, and tobacco abuse.   Admitted 1/6 -> 04/09/17 with NSTEMI with sudden onset of chest tightness and SOB. Noted to be in rapid Afib on arrival. He spontaneously converted to NSR with treatment of his chest pain. Underwent LHC that showed critical 3vD. IABP placed with cardiogenic shock and TCTS consulted for CABG. Pt underwent CABG 03/31/2017 with no immediate complications. Pressors weaned and extubated as tolerated. Weaned off IABP POD #1. Pt diuresed with IV lasix and HF medications adjusted as tolerated.  Discharge weight 217 lbs.   Medtronic ICD placed in 5/19. Echo in 5/20 with EF 20-25%.   He returns for followup of CHF.  He has been doing well symptomatically.  No exertional dyspnea or chest pain.  No lightheadedness or palpitations.  Weight is down 3 lbs.    Medtronic device interrogation: Thoracic impedance stable, no AF/VT.     Labs (3/19): LDL 56, HDL 26, K 4.3, creatinine 1.02 Labs (5/19): K 5, creatinine 0.94  Labs (8/19): K 4.7, creatinine 1.05 Labs (11/19): K 4.1, creatinine 0.9 Labs (2/20): LDL 59, HDl 44, K 4.2, ceatinine 1.09 Labs (4/20): hgb 15.3, K 5, creatinine 1.09, LDL 61  ECG (personally reviewed): sinus bradycardia, left axis deviation, lateral TWIs  Past Medical History 1. Chronic systolic CHF: ECHO 08/23/3760 EF 25-30%. Ischemic cardiomyopathy, now s/p CABG. - Echo (4/19): EF 30-35%, wall motion abnormalities, grade 2 diastolic dysfunction, normal RV size and systolic function.  - Medtronic ICD.  - Echo (5/20): EF 20-25%, normal RV size and systolic function.  2. GBT:DVVOHY FH  premature CAD.NSTEMI 1/19, now s/p CABG 03/2017 3. Atrial fibrillation: Paroxysmal. Noted only in setting of NSTEMI.  4. Smoking: Has quit.  5. Carotid stenosis: Carotid dopplers (11/19) with 40-59% BICA stenosis.   Current Outpatient Medications  Medication Sig Dispense Refill  . aspirin EC 81 MG EC tablet Take 1 tablet (81 mg total) by mouth daily.    [name] atorvastatin (LIPITOR) 80 MG tablet Take 1 tablet (80 mg total) by mouth daily at 6 PM. 30 tablet 5  . carvedilol (COREG) 12.5 MG tablet Take 1 tablet (12.5 mg total) by mouth 2 (two) times daily with a meal. 180 tablet 3  . ibuprofen (ADVIL) 800 MG tablet ibuprofen 800 mg tablet    . sacubitril-valsartan (ENTRESTO) 24-26 MG Take 1 tablet by mouth 2 (two) times daily. 60 tablet 11  . spironolactone (ALDACTONE) 25 MG tablet TAKE 1 TABLET BY MOUTH DAILY 30 tablet 5  . sulfamethoxazole-trimethoprim (BACTRIM DS) 800-160 MG tablet TK 1 T PO  BID     No current facility-administered medications for this encounter.     Allergies:   Patient has no known allergies.   Social History:  The patient  reports that he has quit smoking. His smoking use included cigarettes. He has a 30.00 pack-year smoking history. He has never used smokeless tobacco. He reports that he does not drink alcohol or use drugs.   Family History:  The patient's family history includes Cirrhosis in his mother; Heart attack in his sister; Myocarditis in his brother.  ROS:  Please see the history of present illness.   All other systems are personally reviewed and negative.   Exam:   BP 98/70   Pulse 63   Wt 98.8 kg (217 lb 12.8 oz)   SpO2 99%   BMI 31.25 kg/m  General: NAD Neck: No JVD, no thyromegaly or thyroid nodule.  Lungs: Clear to auscultation bilaterally with normal respiratory effort. CV: Nondisplaced PMI.  Heart regular S1/S2, no S3/S4, no murmur.  No peripheral edema.  No carotid bruit.  Normal pedal pulses.  Abdomen: Soft, nontender, no hepatosplenomegaly, no  distention.  Skin: Intact without lesions or rashes.  Neurologic: Alert and oriented x 3.  Psych: Normal affect. Extremities: No clubbing or cyanosis.  HEENT: Normal.   Recent Labs: 11/01/2018: BUN 25; Creatinine, Ser 1.27; Potassium 4.9; Sodium 136  Personally reviewed   Wt Readings from Last 3 Encounters:  11/01/18 98.8 kg (217 lb 12.8 oz)  08/03/18 97.5 kg (215 lb)  05/03/18 100.1 kg (220 lb 9.6 oz)      ASSESSMENT AND PLAN:  1. Chronic systolic CHF: ECHO 03/30/2017 EF 25-30%. Ischemic cardiomyopathy, now s/p CABG as below. Repeat echo in 4/19 with EF still low at 30-35% and echo in 5/20 showed EF 20-25%.  He now has a Medtronic ICD.  NYHA class I-II symptoms. He is not volume overloaded on exam. SBP continues to run in 90s though he has no orthostatic symptoms.  With low BP, I will not adjust his meds today.   - Continue Coreg 12.5 mg bid.   - Continue Entresto 24/26 bid.   - Continue spironolactone 25 mg daily.  - BMET today.  2. UJW:JXBJYNCAD:Strong FH premature CAD.NSTEMI, now s/p CABG x 5 03/31/2017.  No chest pain.   - Continue ASA 81 and statin, good lipids 4/20.  3. Atrial fibrillation: Paroxysmal. Only noted at initial admission in setting of severe CP/NSTEMI.No palpitations.  - If recurs, he will need to be anticoagulated (holding off for now). 4. Smoking: Has not smoked since 1/19.  5. Carotid bruit: Moderate bilateral ICA stenosis.   - Repeat carotid dopplers in 11/20.   Followup in 3 months.    Signed, Marca Anconaalton Mikita Lesmeister, MD  11/01/2018  [facility] [address] [facility] [place] [address] [phone] (office) [phone] (fax)

## 2018-11-01 NOTE — Patient Instructions (Signed)
Labs done today  Your physician has requested that you have a carotid duplex. This test is an ultrasound of the carotid arteries in your neck. It looks at blood flow through these arteries that supply the brain with blood. Allow one hour for this exam. There are no restrictions or special instructions.  IN November  Your physician recommends that you schedule a follow-up appointment in: 3 months  At the [facility], you and your health needs are our priority. As part of our continuing mission to provide you with exceptional heart care, we have created designated Provider Care Teams. These Care Teams include your primary Cardiologist (physician) and Advanced Practice Providers (APPs- Physician Assistants and Nurse Practitioners) who all work together to provide you with the care you need, when you need it.   You may see any of the following providers on your designated Care Team at your next follow up: [name] Dr Glori Bickers . Dr Loralie Champagne . Darrick Grinder, NP   Please be sure to bring in all your medications bottles to every appointment.

## 2018-11-02 ENCOUNTER — Ambulatory Visit (INDEPENDENT_AMBULATORY_CARE_PROVIDER_SITE_OTHER): Payer: 59 | Admitting: *Deleted

## 2018-11-02 ENCOUNTER — Telehealth (HOSPITAL_COMMUNITY): Payer: Self-pay

## 2018-11-02 DIAGNOSIS — I255 Ischemic cardiomyopathy: Secondary | ICD-10-CM | POA: Diagnosis not present

## 2018-11-02 LAB — CUP PACEART REMOTE DEVICE CHECK
Battery Remaining Longevity: 120 mo
Battery Voltage: 3.01 V
Brady Statistic RV Percent Paced: 0.03 %
Date Time Interrogation Session: 20200811052303
HighPow Impedance: 73 Ohm
Implantable Lead Implant Date: 20190502
Implantable Lead Location: 753860
Implantable Pulse Generator Implant Date: 20190502
Lead Channel Impedance Value: 399 Ohm
Lead Channel Impedance Value: 513 Ohm
Lead Channel Pacing Threshold Amplitude: 1.25 V
Lead Channel Pacing Threshold Pulse Width: 0.4 ms
Lead Channel Sensing Intrinsic Amplitude: 3.875 mV
Lead Channel Sensing Intrinsic Amplitude: 3.875 mV
Lead Channel Setting Pacing Amplitude: 2.5 V
Lead Channel Setting Pacing Pulse Width: 0.4 ms
Lead Channel Setting Sensing Sensitivity: 0.3 mV

## 2018-11-02 NOTE — Telephone Encounter (Signed)
-----   Message from Larey Dresser, MD sent at 11/01/2018  9:33 PM EDT ----- Labs ok, follow low K diet.

## 2018-11-02 NOTE — Telephone Encounter (Signed)
Pt and wife aware of results and recommendations to decrease K in diet. Reviewed foods high in K.  Verbalized understanding and appreciative.

## 2018-11-10 ENCOUNTER — Encounter: Payer: Self-pay | Admitting: Cardiology

## 2018-11-10 NOTE — Progress Notes (Signed)
Remote ICD transmission.   

## 2018-11-20 ENCOUNTER — Other Ambulatory Visit (HOSPITAL_COMMUNITY): Payer: Self-pay | Admitting: Cardiology

## 2019-02-01 ENCOUNTER — Ambulatory Visit (INDEPENDENT_AMBULATORY_CARE_PROVIDER_SITE_OTHER): Payer: 59 | Admitting: *Deleted

## 2019-02-01 DIAGNOSIS — I480 Paroxysmal atrial fibrillation: Secondary | ICD-10-CM

## 2019-02-01 DIAGNOSIS — I255 Ischemic cardiomyopathy: Secondary | ICD-10-CM

## 2019-02-01 LAB — CUP PACEART REMOTE DEVICE CHECK
Battery Remaining Longevity: 118 mo
Battery Voltage: 3 V
Brady Statistic RV Percent Paced: 0.03 %
Date Time Interrogation Session: 20201110213723
HighPow Impedance: 82 Ohm
Implantable Lead Implant Date: 20190502
Implantable Lead Location: 753860
Implantable Pulse Generator Implant Date: 20190502
Lead Channel Impedance Value: 532 Ohm
Lead Channel Impedance Value: 912 Ohm
Lead Channel Pacing Threshold Amplitude: 2 V
Lead Channel Pacing Threshold Pulse Width: 0.4 ms
Lead Channel Sensing Intrinsic Amplitude: 2.875 mV
Lead Channel Sensing Intrinsic Amplitude: 2.875 mV
Lead Channel Setting Pacing Amplitude: 4.25 V
Lead Channel Setting Pacing Pulse Width: 0.4 ms
Lead Channel Setting Sensing Sensitivity: 0.3 mV

## 2019-02-11 ENCOUNTER — Encounter (HOSPITAL_COMMUNITY): Payer: 59

## 2019-02-14 ENCOUNTER — Other Ambulatory Visit (HOSPITAL_COMMUNITY): Payer: Self-pay | Admitting: Cardiology

## 2019-02-14 ENCOUNTER — Other Ambulatory Visit: Payer: Self-pay

## 2019-02-14 ENCOUNTER — Encounter (HOSPITAL_COMMUNITY): Payer: Self-pay | Admitting: Cardiology

## 2019-02-14 ENCOUNTER — Ambulatory Visit (HOSPITAL_BASED_OUTPATIENT_CLINIC_OR_DEPARTMENT_OTHER)
Admission: RE | Admit: 2019-02-14 | Discharge: 2019-02-14 | Disposition: A | Discharge status: Home / Self Care | Payer: 59 | Source: Ambulatory Visit | Attending: Cardiology | Admitting: Cardiology

## 2019-02-14 ENCOUNTER — Ambulatory Visit (HOSPITAL_COMMUNITY)
Admission: RE | Admit: 2019-02-14 | Discharge: 2019-02-14 | Disposition: A | Discharge status: Home / Self Care | Payer: 59 | Source: Ambulatory Visit | Attending: Cardiology | Admitting: Cardiology

## 2019-02-14 VITALS — BP 98/58 | HR 63 | Wt 222.2 lb

## 2019-02-14 DIAGNOSIS — Z951 Presence of aortocoronary bypass graft: Secondary | ICD-10-CM | POA: Insufficient documentation

## 2019-02-14 DIAGNOSIS — Z95810 Presence of automatic (implantable) cardiac defibrillator: Secondary | ICD-10-CM | POA: Insufficient documentation

## 2019-02-14 DIAGNOSIS — Z7984 Long term (current) use of oral hypoglycemic drugs: Secondary | ICD-10-CM | POA: Insufficient documentation

## 2019-02-14 DIAGNOSIS — I25110 Atherosclerotic heart disease of native coronary artery with unstable angina pectoris: Secondary | ICD-10-CM

## 2019-02-14 DIAGNOSIS — Z7982 Long term (current) use of aspirin: Secondary | ICD-10-CM | POA: Insufficient documentation

## 2019-02-14 DIAGNOSIS — I480 Paroxysmal atrial fibrillation: Secondary | ICD-10-CM | POA: Diagnosis not present

## 2019-02-14 DIAGNOSIS — I6523 Occlusion and stenosis of bilateral carotid arteries: Secondary | ICD-10-CM

## 2019-02-14 DIAGNOSIS — Z8249 Family history of ischemic heart disease and other diseases of the circulatory system: Secondary | ICD-10-CM | POA: Diagnosis not present

## 2019-02-14 DIAGNOSIS — Z79899 Other long term (current) drug therapy: Secondary | ICD-10-CM | POA: Diagnosis not present

## 2019-02-14 DIAGNOSIS — I252 Old myocardial infarction: Secondary | ICD-10-CM | POA: Diagnosis not present

## 2019-02-14 DIAGNOSIS — I255 Ischemic cardiomyopathy: Secondary | ICD-10-CM | POA: Diagnosis not present

## 2019-02-14 DIAGNOSIS — I2510 Atherosclerotic heart disease of native coronary artery without angina pectoris: Secondary | ICD-10-CM | POA: Diagnosis not present

## 2019-02-14 DIAGNOSIS — Z87891 Personal history of nicotine dependence: Secondary | ICD-10-CM | POA: Diagnosis not present

## 2019-02-14 DIAGNOSIS — I5022 Chronic systolic (congestive) heart failure: Secondary | ICD-10-CM

## 2019-02-14 DIAGNOSIS — E7849 Other hyperlipidemia: Secondary | ICD-10-CM | POA: Diagnosis not present

## 2019-02-14 LAB — LIPID PANEL
Cholesterol: 133 mg/dL (ref 0–200)
HDL: 43 mg/dL (ref >40)
LDL Cholesterol: 71 mg/dL (ref 0–99)
Total CHOL/HDL Ratio: 3.1 RATIO
Triglycerides: 96 mg/dL (ref <150)
VLDL: 19 mg/dL (ref 0–40)

## 2019-02-14 LAB — BASIC METABOLIC PANEL
Anion gap: 7 (ref 5–15)
BUN: 21 mg/dL (ref 8–23)
CO2: 24 mmol/L (ref 22–32)
Calcium: 8.8 mg/dL — ABNORMAL LOW (ref 8.9–10.3)
Chloride: 107 mmol/L (ref 98–111)
Creatinine, Ser: 1.08 mg/dL (ref 0.61–1.24)
GFR calc Af Amer: >60 mL/min (ref >60)
GFR calc non Af Amer: >60 mL/min (ref >60)
Glucose, Bld: 128 mg/dL — ABNORMAL HIGH (ref 70–99)
Potassium: 4.1 mmol/L (ref 3.5–5.1)
Sodium: 138 mmol/L (ref 135–145)

## 2019-02-14 LAB — MAGNESIUM: Magnesium: 2.1 mg/dL (ref 1.7–2.4)

## 2019-02-14 MED ORDER — DAPAGLIFLOZIN PROPANEDIOL 10 MG PO TABS: 10.0000 mg | ORAL_TABLET | Freq: Every day | ORAL | 5 refills | Status: DC

## 2019-02-14 NOTE — Patient Instructions (Signed)
START Farxiga 10mg  (1 tab) daily  Labs today and repeat in 2 weeks We will only contact you if something comes back abnormal or we need to make some changes. Otherwise no news is good news!  Your physician recommends that you schedule a follow-up appointment in: 3 months with Dr Aundra Dubin  Please call office at [phone] option [phone] if you have any questions or concerns.   At the [facility], you and your health needs are our priority. As part of our continuing mission to provide you with exceptional heart care, we have created designated Provider Care Teams. These Care Teams include your primary Cardiologist (physician) and Advanced Practice Providers (APPs- Physician Assistants and Nurse Practitioners) who all work together to provide you with the care you need, when you need it.   You may see any of the following providers on your designated Care Team at your next follow up: [name] Dr Glori Bickers . Dr Loralie Champagne . Darrick Grinder, NP . Lyda Jester, PA   Please be sure to bring in all your medications bottles to every appointment.

## 2019-02-14 NOTE — Progress Notes (Signed)
Date:  02/14/2019   ID:  Ryan Durham, DOB 01/11/1956, MRN [id number]   Provider location: [facility] Type of Visit: Established patient  PCP:  Shelle Iron, MD  Cardiologist:  Dr. Shirlee Latch   History of Present Illness: Ryan Durham is a 63 y.o. male who has a history of CAD s/p CABG x 5 03/2017, systolic CHF 2/2 ICM, Afib in setting of STEMI, and tobacco abuse.   Admitted 1/6 -> 04/09/17 with NSTEMI with sudden onset of chest tightness and SOB. Noted to be in rapid Afib on arrival. He spontaneously converted to NSR with treatment of his chest pain. Underwent LHC that showed critical 3vD. IABP placed with cardiogenic shock and TCTS consulted for CABG. Pt underwent CABG 03/31/2017 with no immediate complications. Pressors weaned and extubated as tolerated. Weaned off IABP POD #1. Pt diuresed with IV lasix and HF medications adjusted as tolerated.  Discharge weight 217 lbs.   Medtronic ICD placed in 5/19. Echo in 5/20 with EF 20-25%.   He returns for followup of CHF.  He has been doing well in general.  No significant dyspnea, can walk up a flight of stairs without problems.  No chest pain.  No orthopnea/PND.  Occasional mild lightheadedness if he stands too fast.    Medtronic device interrogation: No VT/AF, stable thoracic impedance.      Labs (3/19): LDL 56, HDL 26, K 4.3, creatinine 3.84 Labs (5/19): K 5, creatinine 0.94  Labs (8/19): K 4.7, creatinine 1.05 Labs (11/19): K 4.1, creatinine 0.9 Labs (2/20): LDL 59, HDl 44, K 4.2, ceatinine 1.09 Labs (4/20): hgb 15.3, K 5, creatinine 1.09, LDL 61 Labs (8/20): K 4.9, creatinine 1.27  Past Medical History 1. Chronic systolic CHF: ECHO 03/30/2017 EF 25-30%. Ischemic cardiomyopathy, now s/p CABG. - Echo (4/19): EF 30-35%, wall motion abnormalities, grade 2 diastolic dysfunction, normal RV size and systolic function.  - Medtronic ICD.  - Echo (5/20): EF 20-25%, normal RV size and systolic function.  2.  YKZ:LDJTTS FH premature CAD.NSTEMI 1/19, now s/p CABG 03/2017 3. Atrial fibrillation: Paroxysmal. Noted only in setting of NSTEMI.  4. Smoking: Has quit.  5. Carotid stenosis: Carotid dopplers (11/19) with 40-59% BICA stenosis.   Current Outpatient Medications  Medication Sig Dispense Refill  . aspirin EC 81 MG EC tablet Take 1 tablet (81 mg total) by mouth daily.    [name] atorvastatin (LIPITOR) 80 MG tablet TAKE 1 TABLET(80 MG) BY MOUTH DAILY AT 6 PM 30 tablet 5  . carvedilol (COREG) 12.5 MG tablet Take 1 tablet (12.5 mg total) by mouth 2 (two) times daily with a meal. 180 tablet 3  . ibuprofen (ADVIL) 800 MG tablet ibuprofen 800 mg tablet    . sacubitril-valsartan (ENTRESTO) 24-26 MG Take 1 tablet by mouth 2 (two) times daily. 60 tablet 11  . spironolactone (ALDACTONE) 25 MG tablet TAKE 1 TABLET BY MOUTH DAILY 30 tablet 5  . dapagliflozin propanediol (FARXIGA) 10 MG TABS tablet Take 10 mg by mouth daily before breakfast. 30 tablet 5   No current facility-administered medications for this encounter.     Allergies:   Patient has no known allergies.   Social History:  The patient  reports that he has quit smoking. His smoking use included cigarettes. He has a 30.00 pack-year smoking history. He has never used smokeless tobacco. He reports that he does not drink alcohol or use drugs.   Family History:  The patient's family history includes Cirrhosis in his mother;  Heart attack in his sister; Myocarditis in his brother.   ROS:  Please see the history of present illness.   All other systems are personally reviewed and negative.   Exam:   BP (!) 98/58   Pulse 63   Wt 100.8 kg (222 lb 3.2 oz)   SpO2 96%   BMI 31.88 kg/m  General: NAD Neck: No JVD, no thyromegaly or thyroid nodule.  Lungs: Clear to auscultation bilaterally with normal respiratory effort. CV: Nondisplaced PMI.  Heart regular S1/S2, no S3/S4, no murmur.  No peripheral edema.  No carotid bruit.  Normal pedal pulses.  Abdomen:  Soft, nontender, no hepatosplenomegaly, no distention.  Skin: Intact without lesions or rashes.  Neurologic: Alert and oriented x 3.  Psych: Normal affect. Extremities: No clubbing or cyanosis.  HEENT: Normal.   Recent Labs: 02/14/2019: BUN 21; Creatinine, Ser 1.08; Magnesium 2.1; Potassium 4.1; Sodium 138  Personally reviewed   Wt Readings from Last 3 Encounters:  02/14/19 100.8 kg (222 lb 3.2 oz)  11/01/18 98.8 kg (217 lb 12.8 oz)  08/03/18 97.5 kg (215 lb)      ASSESSMENT AND PLAN:  1. Chronic systolic CHF: ECHO 06/23/7060 EF 25-30%. Ischemic cardiomyopathy, now s/p CABG as below. Repeat echo in 4/19 with EF still low at 30-35% and echo in 5/20 showed EF 20-25%.  He now has a Medtronic ICD.  NYHA class I-II symptoms. He is not volume overloaded on exam or by Optivol. SBP continues to run in 90s though he has no orthostatic symptoms.    - Continue Coreg 12.5 mg bid.   - Continue Entresto 24/26 bid.   - Continue spironolactone 25 mg daily.  - Start dapagliflozin 10 mg daily.  BMET today and in 10 days.  2. BJS:EGBTDV FH premature CAD.NSTEMI, now s/p CABG x 5 03/31/2017.  No chest pain.   - Continue ASA 81 and statin.  Check lipids today.  3. Atrial fibrillation: Paroxysmal. Only noted at initial admission in setting of severe CP/NSTEMI.No palpitations. No recent atrial fibrillation by device interrogation.  - If recurs, he will need to be anticoagulated (holding off for now). 4. Smoking: Has not smoked since 1/19.  5. Carotid bruit: Moderate bilateral ICA stenosis.   - Repeat carotid dopplers to be done later today.   Followup in 3 months.    Signed, Loralie Champagne, MD  02/14/2019  [place] [address] [facility] [place] [address] [phone] (office) [phone] (fax)

## 2019-02-15 ENCOUNTER — Telehealth (HOSPITAL_COMMUNITY): Payer: Self-pay | Admitting: Pharmacist

## 2019-02-15 MED ORDER — EMPAGLIFLOZIN 10 MG PO TABS: 10.0000 mg | ORAL_TABLET | Freq: Every day | ORAL | 11 refills | Status: DC

## 2019-02-15 NOTE — Telephone Encounter (Signed)
Advanced Heart Failure Patient Advocate Encounter  Prior Authorization for Wilder Glade was denied. Patient was approved to receive Jardiance 10 mg daily.   PA# [id number] Effective dates: 02/15/19 through 02/15/20  Patients co-pay is $35.00.  Copay Card activated: Bin: [id number] PCN: [employer]: [id number] ID: [id number]  Copay is now $0.00.   Ryan Durham, PharmD, BCPS, BCCP, CPP Heart Failure Clinic Pharmacist [phone]

## 2019-02-15 NOTE — Telephone Encounter (Signed)
Patient Advocate Encounter   Received notification from [employer] that prior authorization for Wilder Glade is required.   PA submitted on CoverMyMeds Key [id number] Status is pending   Will continue to follow.  Audry Riles, PharmD, BCPS, BCCP, CPP Heart [facility] Clinic Pharmacist [phone]

## 2019-02-16 ENCOUNTER — Encounter (HOSPITAL_COMMUNITY): Payer: Self-pay

## 2019-02-22 NOTE — Progress Notes (Signed)
Remote ICD transmission.   

## 2019-03-14 ENCOUNTER — Other Ambulatory Visit: Payer: Self-pay | Admitting: Cardiology

## 2019-03-14 ENCOUNTER — Other Ambulatory Visit (HOSPITAL_COMMUNITY): Payer: Self-pay

## 2019-03-14 MED ORDER — SPIRONOLACTONE 25 MG PO TABS: 25.0000 mg | ORAL_TABLET | Freq: Every day | ORAL | 5 refills | Status: DC

## 2019-03-15 LAB — BASIC METABOLIC PANEL
BUN/Creatinine Ratio: 20 (ref 10–24)
BUN: 24 mg/dL (ref 8–27)
CO2: 24 mmol/L (ref 20–29)
Calcium: 9.3 mg/dL (ref 8.6–10.2)
Chloride: 102 mmol/L (ref 96–106)
Creatinine, Ser: 1.18 mg/dL (ref 0.76–1.27)
GFR calc Af Amer: 77 mL/min/{1.73_m2} (ref >59)
GFR calc non Af Amer: 66 mL/min/{1.73_m2} (ref >59)
Glucose: 123 mg/dL — ABNORMAL HIGH (ref 65–99)
Potassium: 4.6 mmol/L (ref 3.5–5.2)
Sodium: 138 mmol/L (ref 134–144)

## 2019-03-15 LAB — SPECIMEN STATUS REPORT

## 2019-03-16 ENCOUNTER — Other Ambulatory Visit: Payer: Self-pay

## 2019-03-17 ENCOUNTER — Other Ambulatory Visit (HOSPITAL_COMMUNITY): Payer: Self-pay | Admitting: Cardiology

## 2019-04-11 ENCOUNTER — Other Ambulatory Visit: Payer: Self-pay | Admitting: Cardiology

## 2019-04-13 MED ORDER — ENTRESTO 24-26 MG PO TABS: 1.0000 | ORAL_TABLET | Freq: Two times a day (BID) | ORAL | 11 refills | Status: DC

## 2019-05-03 ENCOUNTER — Ambulatory Visit (INDEPENDENT_AMBULATORY_CARE_PROVIDER_SITE_OTHER): Payer: 59 | Admitting: *Deleted

## 2019-05-03 DIAGNOSIS — I255 Ischemic cardiomyopathy: Secondary | ICD-10-CM | POA: Diagnosis not present

## 2019-05-03 LAB — CUP PACEART REMOTE DEVICE CHECK
Battery Remaining Longevity: 115 mo
Battery Voltage: 3 V
Brady Statistic RV Percent Paced: 0.03 %
Date Time Interrogation Session: 20210209001703
HighPow Impedance: 77 Ohm
Implantable Lead Implant Date: 20190502
Implantable Lead Location: 753860
Implantable Pulse Generator Implant Date: 20190502
Lead Channel Impedance Value: 627 Ohm
Lead Channel Impedance Value: 988 Ohm
Lead Channel Pacing Threshold Amplitude: 2 V
Lead Channel Pacing Threshold Pulse Width: 0.4 ms
Lead Channel Sensing Intrinsic Amplitude: 4 mV
Lead Channel Sensing Intrinsic Amplitude: 4 mV
Lead Channel Setting Pacing Amplitude: 4.5 V
Lead Channel Setting Pacing Pulse Width: 0.4 ms
Lead Channel Setting Sensing Sensitivity: 0.3 mV

## 2019-05-04 NOTE — Progress Notes (Signed)
ICD Remote  

## 2019-05-06 DIAGNOSIS — U071 COVID-19: Secondary | ICD-10-CM | POA: Insufficient documentation

## 2019-05-13 ENCOUNTER — Other Ambulatory Visit (HOSPITAL_COMMUNITY): Payer: Self-pay | Admitting: *Deleted

## 2019-05-13 MED ORDER — ATORVASTATIN CALCIUM 80 MG PO TABS: ORAL_TABLET | ORAL | 6 refills | Status: DC

## 2019-05-24 ENCOUNTER — Encounter (HOSPITAL_COMMUNITY): Payer: 59 | Admitting: Cardiology

## 2019-06-13 ENCOUNTER — Ambulatory Visit (HOSPITAL_COMMUNITY)
Admission: RE | Admit: 2019-06-13 | Discharge: 2019-06-13 | Disposition: A | Discharge status: Home / Self Care | Payer: 59 | Source: Ambulatory Visit | Attending: Cardiology | Admitting: Cardiology

## 2019-06-13 ENCOUNTER — Other Ambulatory Visit: Payer: Self-pay

## 2019-06-13 ENCOUNTER — Encounter (HOSPITAL_COMMUNITY): Payer: Self-pay | Admitting: Cardiology

## 2019-06-13 VITALS — BP 92/60 | HR 73 | Wt 211.8 lb

## 2019-06-13 DIAGNOSIS — I255 Ischemic cardiomyopathy: Secondary | ICD-10-CM | POA: Diagnosis not present

## 2019-06-13 DIAGNOSIS — Z951 Presence of aortocoronary bypass graft: Secondary | ICD-10-CM | POA: Insufficient documentation

## 2019-06-13 DIAGNOSIS — Z87891 Personal history of nicotine dependence: Secondary | ICD-10-CM | POA: Diagnosis not present

## 2019-06-13 DIAGNOSIS — I5022 Chronic systolic (congestive) heart failure: Secondary | ICD-10-CM | POA: Diagnosis not present

## 2019-06-13 DIAGNOSIS — Z79899 Other long term (current) drug therapy: Secondary | ICD-10-CM | POA: Diagnosis not present

## 2019-06-13 DIAGNOSIS — Z7982 Long term (current) use of aspirin: Secondary | ICD-10-CM | POA: Diagnosis not present

## 2019-06-13 DIAGNOSIS — I252 Old myocardial infarction: Secondary | ICD-10-CM | POA: Diagnosis not present

## 2019-06-13 DIAGNOSIS — I480 Paroxysmal atrial fibrillation: Secondary | ICD-10-CM | POA: Diagnosis not present

## 2019-06-13 DIAGNOSIS — Z95810 Presence of automatic (implantable) cardiac defibrillator: Secondary | ICD-10-CM | POA: Diagnosis not present

## 2019-06-13 DIAGNOSIS — I2510 Atherosclerotic heart disease of native coronary artery without angina pectoris: Secondary | ICD-10-CM | POA: Insufficient documentation

## 2019-06-13 LAB — CBC
HCT: 46.4 % (ref 39.0–52.0)
Hemoglobin: 15.7 g/dL (ref 13.0–17.0)
MCH: 33.5 pg (ref 26.0–34.0)
MCHC: 33.8 g/dL (ref 30.0–36.0)
MCV: 98.9 fL (ref 80.0–100.0)
Platelets: 173 10*3/uL (ref 150–400)
RBC: 4.69 MIL/uL (ref 4.22–5.81)
RDW: 14.2 % (ref 11.5–15.5)
WBC: 6.4 10*3/uL (ref 4.0–10.5)
nRBC: 0 % (ref 0.0–0.2)

## 2019-06-13 LAB — BASIC METABOLIC PANEL
Anion gap: 10 (ref 5–15)
BUN: 22 mg/dL (ref 8–23)
CO2: 22 mmol/L (ref 22–32)
Calcium: 9.4 mg/dL (ref 8.9–10.3)
Chloride: 103 mmol/L (ref 98–111)
Creatinine, Ser: 1.15 mg/dL (ref 0.61–1.24)
GFR calc Af Amer: >60 mL/min (ref >60)
GFR calc non Af Amer: >60 mL/min (ref >60)
Glucose, Bld: 159 mg/dL — ABNORMAL HIGH (ref 70–99)
Potassium: 4.3 mmol/L (ref 3.5–5.1)
Sodium: 135 mmol/L (ref 135–145)

## 2019-06-13 NOTE — Progress Notes (Signed)
Date:  06/13/2019   ID:  [name], DOB [birth year], MRN [id number]   Provider location: [facility] Type of Visit: Established patient  PCP:  Charlotte Sanes, MD  Cardiologist:  Dr. Aundra Dubin   History of Present Illness: Ryan Durham is a 64 y.o. male who has a history of CAD s/p CABG x 5 04/9922, systolic CHF 2/2 ICM, Afib in setting of STEMI, and tobacco abuse.   Admitted 1/6 -> 04/09/17 with NSTEMI with sudden onset of chest tightness and SOB. Noted to be in rapid Afib on arrival. He spontaneously converted to NSR with treatment of his chest pain. Underwent LHC that showed critical 3vD. IABP placed with cardiogenic shock and TCTS consulted for CABG. Pt underwent CABG 03/31/2017 with no immediate complications. Pressors weaned and extubated as tolerated. Weaned off IABP POD #1. Pt diuresed with IV lasix and HF medications adjusted as tolerated.  Discharge weight 217 lbs.   Medtronic ICD placed in 5/19. Echo in 5/20 with EF 20-25%.   He returns for followup of CHF.  He had COVID-19 in 2/21.  He was out of work for about a month but seems to have fully recovered now.  No significant exertional dyspnea.  No chest pain. No lightheadedness.  Weight is down 11 lbs (lost sense of taste/smell with COVID-19).      Medtronic device interrogation: No VT/AF, stable thoracic impedance.   ECG (personally reviewed): NSR, old ASMI.      Labs (3/19): LDL 56, HDL 26, K 4.3, creatinine 1.02 Labs (5/19): K 5, creatinine 0.94  Labs (8/19): K 4.7, creatinine 1.05 Labs (11/19): K 4.1, creatinine 0.9 Labs (2/20): LDL 59, HDl 44, K 4.2, ceatinine 1.09 Labs (4/20): hgb 15.3, K 5, creatinine 1.09, LDL 61 Labs (8/20): K 4.9, creatinine 1.27 Labs (11/20): LDL 71, HDL 43 Labs (12/20): K 4.6, creatinine 1.18  Past Medical History 1. Chronic systolic CHF: ECHO 04/30/8339 EF 25-30%. Ischemic cardiomyopathy, now s/p CABG. - Echo (4/19): EF 30-35%, wall motion abnormalities, grade 2  diastolic dysfunction, normal RV size and systolic function.  - Medtronic ICD.  - Echo (5/20): EF 20-25%, normal RV size and systolic function.  2. DQQ:IWLNLG FH premature CAD.NSTEMI 1/19, now s/p CABG 03/2017 3. Atrial fibrillation: Paroxysmal. Noted only in setting of NSTEMI.  4. Smoking: Has quit.  5. Carotid stenosis: Carotid dopplers (11/19) with 40-59% BICA stenosis.  - Carotid dopplers (11/20): 40-59% RICA  Current Outpatient Medications  Medication Sig Dispense Refill  . aspirin EC 81 MG EC tablet Take 1 tablet (81 mg total) by mouth daily.    [name] atorvastatin (LIPITOR) 80 MG tablet TAKE 1 TABLET(80 MG) BY MOUTH DAILY AT 6 PM 30 tablet 6  . carvedilol (COREG) 12.5 MG tablet Take 1 tablet (12.5 mg total) by mouth 2 (two) times daily with a meal. 180 tablet 3  . empagliflozin (JARDIANCE) 10 MG TABS tablet Take 10 mg by mouth daily before breakfast. 30 tablet 11  . sacubitril-valsartan (ENTRESTO) 24-26 MG Take 1 tablet by mouth 2 (two) times daily. 60 tablet 11  . spironolactone (ALDACTONE) 25 MG tablet Take 1 tablet (25 mg total) by mouth daily. 30 tablet 5   No current facility-administered medications for this encounter.    Allergies:   Patient has no known allergies.   Social History:  The patient  reports that he has quit smoking. His smoking use included cigarettes. He has a 30.00 pack-year smoking history. He has never used smokeless tobacco.  He reports that he does not drink alcohol or use drugs.   Family History:  The patient's family history includes Cirrhosis in his mother; Heart attack in his sister; Myocarditis in his brother.   ROS:  Please see the history of present illness.   All other systems are personally reviewed and negative.   Exam:   BP 92/60   Pulse 73   Wt 96.1 kg (211 lb 12.8 oz)   SpO2 95%   BMI 30.39 kg/m  General: NAD Neck: No JVD, no thyromegaly or thyroid nodule.  Lungs: Clear to auscultation bilaterally with normal respiratory effort. CV:  Nondisplaced PMI.  Heart regular S1/S2, no S3/S4, no murmur.  No peripheral edema.  No carotid bruit.  Normal pedal pulses.  Abdomen: Soft, nontender, no hepatosplenomegaly, no distention.  Skin: Intact without lesions or rashes.  Neurologic: Alert and oriented x 3.  Psych: Normal affect. Extremities: No clubbing or cyanosis.  HEENT: Normal.    Recent Labs: 02/14/2019: Magnesium 2.1 06/13/2019: BUN 22; Creatinine, Ser 1.15; Hemoglobin 15.7; Platelets 173; Potassium 4.3; Sodium 135  Personally reviewed   Wt Readings from Last 3 Encounters:  06/13/19 96.1 kg (211 lb 12.8 oz)  02/14/19 100.8 kg (222 lb 3.2 oz)  11/01/18 98.8 kg (217 lb 12.8 oz)      ASSESSMENT AND PLAN:  1. Chronic systolic CHF: ECHO 03/30/2017 EF 25-30%. Ischemic cardiomyopathy, now s/p CABG as below. Repeat echo in 4/19 with EF still low at 30-35% and echo in 5/20 showed EF 20-25%.  He now has a Medtronic ICD.  NYHA class I-II symptoms. He is not volume overloaded on exam or by Optivol. SBP continues to run in 90s though he has no orthostatic symptoms.  No BP room to increase his meds.  - Continue Coreg 12.5 mg bid.    - Continue Entresto 24/26 bid.   - Continue spironolactone 25 mg daily.  - Continue dapagliflozin 10 mg daily.  - BMET today.  - I will arrange for echo at followup in 3 months.   2. EUM:PNTIRW FH premature CAD.NSTEMI, now s/p CABG x 5 03/31/2017.  No chest pain.   - Continue ASA 81 and statin.  Good lipids in 11/20.  3. Atrial fibrillation: Paroxysmal. Only noted at initial admission in setting of severe CP/NSTEMI.No palpitations. No recent atrial fibrillation by device interrogation. NSR today.  - If recurs, he will need to be anticoagulated (holding off for now). 4. Smoking: Has not smoked since 1/19.  5. Carotid bruit: Moderate RICA stenosis on 11/20 dopplers, repeat in 11/21.   Followup in 3 months with echo.    Signed, Marca Ancona, MD  06/13/2019  [facility] [address] [facility] [place] [address] [phone] (office) [phone] (fax)

## 2019-06-13 NOTE — Patient Instructions (Signed)
No medication changes today!  Labs today We will only contact you if something comes back abnormal or we need to make some changes. Otherwise no news is good news!  Your physician has requested that you have an echocardiogram. Echocardiography is a painless test that uses sound waves to create images of your heart. It provides your doctor with information about the size and shape of your heart and how well your heart's chambers and valves are working. This procedure takes approximately one hour. There are no restrictions for this procedure.  Your physician recommends that you schedule a follow-up appointment in: 3 months for an Echo and visit with Dr Modena Jansky Code:  June 5007  Please call office at [phone] option 2 if you have any questions or concerns.   At the [facility], you and your health needs are our priority. As part of our continuing mission to provide you with exceptional heart care, we have created designated Provider Care Teams. These Care Teams include your primary Cardiologist (physician) and Advanced Practice Providers (APPs- Physician Assistants and Nurse Practitioners) who all work together to provide you with the care you need, when you need it.   You may see any of the following providers on your designated Care Team at your next follow up: [name] Dr Arvilla Meres . Dr Marca Ancona . Tonye Becket, NP . Robbie Lis, PA . Karle Plumber, PharmD   Please be sure to bring in all your medications bottles to every appointment.

## 2019-07-07 ENCOUNTER — Other Ambulatory Visit: Payer: Self-pay

## 2019-08-02 ENCOUNTER — Other Ambulatory Visit (HOSPITAL_COMMUNITY): Payer: Self-pay

## 2019-08-02 ENCOUNTER — Ambulatory Visit (INDEPENDENT_AMBULATORY_CARE_PROVIDER_SITE_OTHER): Payer: 59 | Admitting: *Deleted

## 2019-08-02 DIAGNOSIS — I480 Paroxysmal atrial fibrillation: Secondary | ICD-10-CM

## 2019-08-02 DIAGNOSIS — I255 Ischemic cardiomyopathy: Secondary | ICD-10-CM | POA: Diagnosis not present

## 2019-08-02 LAB — CUP PACEART REMOTE DEVICE CHECK
Battery Remaining Longevity: 110 mo
Battery Voltage: 2.99 V
Brady Statistic RV Percent Paced: 0.03 %
Date Time Interrogation Session: 20210511163824
HighPow Impedance: 86 Ohm
Implantable Lead Implant Date: 20190502
Implantable Lead Location: 753860
Implantable Pulse Generator Implant Date: 20190502
Lead Channel Impedance Value: 1083 Ohm
Lead Channel Impedance Value: 722 Ohm
Lead Channel Pacing Threshold Amplitude: 2.375 V
Lead Channel Pacing Threshold Pulse Width: 0.4 ms
Lead Channel Sensing Intrinsic Amplitude: 3.375 mV
Lead Channel Sensing Intrinsic Amplitude: 3.375 mV
Lead Channel Setting Pacing Amplitude: 4.75 V
Lead Channel Setting Pacing Pulse Width: 0.4 ms
Lead Channel Setting Sensing Sensitivity: 0.3 mV

## 2019-08-02 MED ORDER — CARVEDILOL 12.5 MG PO TABS: 12.5000 mg | ORAL_TABLET | Freq: Two times a day (BID) | ORAL | 3 refills | Status: DC

## 2019-08-03 NOTE — Progress Notes (Signed)
Remote ICD transmission.   

## 2019-09-09 ENCOUNTER — Other Ambulatory Visit (HOSPITAL_COMMUNITY): Payer: Self-pay

## 2019-09-09 MED ORDER — SPIRONOLACTONE 25 MG PO TABS: 25.0000 mg | ORAL_TABLET | Freq: Every day | ORAL | 3 refills | Status: DC

## 2019-09-15 ENCOUNTER — Ambulatory Visit (HOSPITAL_COMMUNITY)
Admission: RE | Admit: 2019-09-15 | Discharge: 2019-09-15 | Disposition: A | Discharge status: Home / Self Care | Payer: 59 | Source: Ambulatory Visit | Attending: Family Medicine | Admitting: Family Medicine

## 2019-09-15 ENCOUNTER — Other Ambulatory Visit: Payer: Self-pay

## 2019-09-15 ENCOUNTER — Ambulatory Visit (HOSPITAL_BASED_OUTPATIENT_CLINIC_OR_DEPARTMENT_OTHER)
Admission: RE | Admit: 2019-09-15 | Discharge: 2019-09-15 | Disposition: A | Discharge status: Home / Self Care | Payer: 59 | Source: Ambulatory Visit | Attending: Cardiology | Admitting: Cardiology

## 2019-09-15 ENCOUNTER — Encounter (HOSPITAL_COMMUNITY): Payer: Self-pay | Admitting: Cardiology

## 2019-09-15 VITALS — BP 130/70 | HR 62 | Wt 215.0 lb

## 2019-09-15 DIAGNOSIS — I480 Paroxysmal atrial fibrillation: Secondary | ICD-10-CM | POA: Insufficient documentation

## 2019-09-15 DIAGNOSIS — I5022 Chronic systolic (congestive) heart failure: Secondary | ICD-10-CM

## 2019-09-15 DIAGNOSIS — I358 Other nonrheumatic aortic valve disorders: Secondary | ICD-10-CM | POA: Diagnosis not present

## 2019-09-15 DIAGNOSIS — I255 Ischemic cardiomyopathy: Secondary | ICD-10-CM

## 2019-09-15 DIAGNOSIS — Z95810 Presence of automatic (implantable) cardiac defibrillator: Secondary | ICD-10-CM | POA: Diagnosis not present

## 2019-09-15 DIAGNOSIS — I509 Heart failure, unspecified: Secondary | ICD-10-CM | POA: Diagnosis not present

## 2019-09-15 DIAGNOSIS — E785 Hyperlipidemia, unspecified: Secondary | ICD-10-CM | POA: Diagnosis not present

## 2019-09-15 DIAGNOSIS — I2510 Atherosclerotic heart disease of native coronary artery without angina pectoris: Secondary | ICD-10-CM | POA: Insufficient documentation

## 2019-09-15 DIAGNOSIS — Z951 Presence of aortocoronary bypass graft: Secondary | ICD-10-CM | POA: Diagnosis not present

## 2019-09-15 DIAGNOSIS — I252 Old myocardial infarction: Secondary | ICD-10-CM | POA: Diagnosis not present

## 2019-09-15 DIAGNOSIS — E119 Type 2 diabetes mellitus without complications: Secondary | ICD-10-CM | POA: Diagnosis not present

## 2019-09-15 DIAGNOSIS — Z87891 Personal history of nicotine dependence: Secondary | ICD-10-CM | POA: Insufficient documentation

## 2019-09-15 LAB — BASIC METABOLIC PANEL
Anion gap: 8 (ref 5–15)
BUN: 17 mg/dL (ref 8–23)
CO2: 25 mmol/L (ref 22–32)
Calcium: 9.3 mg/dL (ref 8.9–10.3)
Chloride: 104 mmol/L (ref 98–111)
Creatinine, Ser: 1.05 mg/dL (ref 0.61–1.24)
GFR calc Af Amer: >60 mL/min (ref >60)
GFR calc non Af Amer: >60 mL/min (ref >60)
Glucose, Bld: 101 mg/dL — ABNORMAL HIGH (ref 70–99)
Potassium: 4.5 mmol/L (ref 3.5–5.1)
Sodium: 137 mmol/L (ref 135–145)

## 2019-09-15 MED ORDER — ENTRESTO 49-51 MG PO TABS: 1.0000 | ORAL_TABLET | Freq: Two times a day (BID) | ORAL | 3 refills | Status: DC

## 2019-09-15 NOTE — Progress Notes (Signed)
  Echocardiogram 2D Echocardiogram has been performed.  Ryan Durham 09/15/2019, 2:46 PM

## 2019-09-15 NOTE — Patient Instructions (Signed)
Increase Entresto to 49/51 mg Twice daily   Labs done today, your results will be available in MyChart, we will contact you for abnormal readings.  Labs needed in 10-14 days, we have provided you a prescription to have this done locally  Please follow up with our heart failure pharmacist in 1 month  Your physician recommends that you schedule a follow-up appointment in: 3 months  If you have any questions or concerns before your next appointment please send [place] a message through [place] or call our office at [phone].    TO LEAVE A MESSAGE FOR THE NURSE SELECT OPTION 2, PLEASE LEAVE A MESSAGE INCLUDING: . YOUR NAME . DATE OF BIRTH . CALL BACK NUMBER . REASON FOR CALL**this is important as we prioritize the call backs  YOU WILL RECEIVE A CALL BACK THE SAME DAY AS LONG AS YOU CALL BEFORE 4:00 PM  At the [facility], you and your health needs are our priority. As part of our continuing mission to provide you with exceptional heart care, we have created designated Provider Care Teams. These Care Teams include your primary Cardiologist (physician) and Advanced Practice Providers (APPs- Physician Assistants and Nurse Practitioners) who all work together to provide you with the care you need, when you need it.   You may see any of the following providers on your designated Care Team at your next follow up: [name] Dr Arvilla Meres . Dr Marca Ancona . Tonye Becket, NP . Robbie Lis, PA . Karle Plumber, PharmD   Please be sure to bring in all your medications bottles to every appointment.

## 2019-09-16 NOTE — Progress Notes (Signed)
Date:  09/16/2019   ID:  Ryan Durham, DOB 11-12-1955, MRN [id number]   Provider location: [facility] Type of Visit: Established patient  PCP:  Shelle Iron, MD  Cardiologist:  Dr. Shirlee Latch   History of Present Illness: Ryan Durham is a 64 y.o. male who has a history of CAD s/p CABG x 5 03/2017, systolic CHF 2/2 ICM, Afib in setting of STEMI, and tobacco abuse.   Admitted 1/6 -> 04/09/17 with NSTEMI with sudden onset of chest tightness and SOB. Noted to be in rapid Afib on arrival. He spontaneously converted to NSR with treatment of his chest pain. Underwent LHC that showed critical 3vD. IABP placed with cardiogenic shock and TCTS consulted for CABG. Pt underwent CABG 03/31/2017 with no immediate complications. Pressors weaned and extubated as tolerated. Weaned off IABP POD #1. Pt diuresed with IV lasix and HF medications adjusted as tolerated.  Discharge weight 217 lbs.   Medtronic ICD placed in 5/19. Echo in 5/20 with EF 20-25%.  Echo was done today and reviewed, EF 30-35%, global HK, normal RV size and systolic function.   He returns for followup of CHF.  Seems to be doing well in general.  Back at work full time.  No significant dyspnea walking on flat ground.  No chest pain.  No orthopnea/PND.  No lightheadedness.  Tolerating all meds.      Labs (3/19): LDL 56, HDL 26, K 4.3, creatinine 2.54 Labs (5/19): K 5, creatinine 0.94  Labs (8/19): K 4.7, creatinine 1.05 Labs (11/19): K 4.1, creatinine 0.9 Labs (2/20): LDL 59, HDl 44, K 4.2, ceatinine 1.09 Labs (4/20): hgb 15.3, K 5, creatinine 1.09, LDL 61 Labs (8/20): K 4.9, creatinine 1.27 Labs (11/20): LDL 71, HDL 43 Labs (12/20): K 4.6, creatinine 1.18 Labs (6/21): K 4.5, creatinine 1.05  Past Medical History 1. Chronic systolic CHF: ECHO 03/30/2017 EF 25-30%. Ischemic cardiomyopathy, now s/p CABG. - Echo (4/19): EF 30-35%, wall motion abnormalities, grade 2 diastolic dysfunction, normal RV size and  systolic function.  - Medtronic ICD.  - Echo (5/20): EF 20-25%, normal RV size and systolic function.  - Echo (6/21): EF 30-35%, global HK, normal RV size and systolic function.  2. YHC:WCBJSE FH premature CAD.NSTEMI 1/19, now s/p CABG 03/2017 3. Atrial fibrillation: Paroxysmal. Noted only in setting of NSTEMI.  4. Smoking: Has quit.  5. Carotid stenosis: Carotid dopplers (11/19) with 40-59% BICA stenosis.  - Carotid dopplers (11/20): 40-59% RICA  Current Outpatient Medications  Medication Sig Dispense Refill   aspirin EC 81 MG EC tablet Take 1 tablet (81 mg total) by mouth daily.     atorvastatin (LIPITOR) 80 MG tablet TAKE 1 TABLET(80 MG) BY MOUTH DAILY AT 6 PM 30 tablet 6   carvedilol (COREG) 12.5 MG tablet Take 1 tablet (12.5 mg total) by mouth 2 (two) times daily with a meal. 180 tablet 3   empagliflozin (JARDIANCE) 10 MG TABS tablet Take 10 mg by mouth daily before breakfast. 30 tablet 11   spironolactone (ALDACTONE) 25 MG tablet Take 1 tablet (25 mg total) by mouth daily. 90 tablet 3   sacubitril-valsartan (ENTRESTO) 49-51 MG Take 1 tablet by mouth 2 (two) times daily. 60 tablet 3   No current facility-administered medications for this encounter.    Allergies:   Patient has no known allergies.   Social History:  The patient  reports that he has quit smoking. His smoking use included cigarettes. He has a 30.00 pack-year smoking history.  He has never used smokeless tobacco. He reports that he does not drink alcohol and does not use drugs.   Family History:  The patient's family history includes Cirrhosis in his mother; Heart attack in his sister; Myocarditis in his brother.   ROS:  Please see the history of present illness.   All other systems are personally reviewed and negative.   Exam:   BP 130/70    Pulse 62    Wt 97.5 kg (215 lb)    SpO2 97%    BMI 30.85 kg/m  General: NAD Neck: No JVD, no thyromegaly or thyroid nodule.  Lungs: Clear to auscultation bilaterally  with normal respiratory effort. CV: Nondisplaced PMI.  Heart regular S1/S2, no S3/S4, no murmur.  No peripheral edema.  No carotid bruit.  Normal pedal pulses.  Abdomen: Soft, nontender, no hepatosplenomegaly, no distention.  Skin: Intact without lesions or rashes.  Neurologic: Alert and oriented x 3.  Psych: Normal affect. Extremities: No clubbing or cyanosis.  HEENT: Normal.   Recent Labs: 02/14/2019: Magnesium 2.1 06/13/2019: Hemoglobin 15.7; Platelets 173 09/15/2019: BUN 17; Creatinine, Ser 1.05; Potassium 4.5; Sodium 137  Personally reviewed   Wt Readings from Last 3 Encounters:  09/15/19 97.5 kg (215 lb)  06/13/19 96.1 kg (211 lb 12.8 oz)  02/14/19 100.8 kg (222 lb 3.2 oz)      ASSESSMENT AND PLAN:  1. Chronic systolic CHF: ECHO 07/23/7614 EF 25-30%. Ischemic cardiomyopathy, now s/p CABG as below. Repeat echo in 4/19 with EF still low at 30-35% and echo in 5/20 showed EF 20-25%.  Echo was done today and reviewed, EF 30-35%.  He now has a Medtronic ICD.  NYHA class I-II symptoms. He is not volume overloaded on exam.  BMET today stable.  - Continue Coreg 12.5 mg bid.    - Increase Entresto to 49/51 bid.  - Continue spironolactone 25 mg daily.  - Continue dapagliflozin 10 mg daily.   2. WVP:XTGGYI FH premature CAD.NSTEMI, now s/p CABG x 5 03/31/2017.  No chest pain.   - Continue ASA 81 and statin.  Good lipids in 11/20.  3. Atrial fibrillation: Paroxysmal. Only noted at initial admission in setting of severe CP/NSTEMI.No palpitations. No recent atrial fibrillation by device interrogation. NSR today.  - If recurs, he will need to be anticoagulated (holding off for now). 4. Smoking: Has not smoked since 1/19.  5. Carotid bruit: Moderate RICA stenosis on 11/20 dopplers, repeat in 11/21.   Followup in 3 months    Signed, Loralie Champagne, MD  09/16/2019  [facility] [address] [facility] [place] [address] [phone]  (office) [phone] (fax)

## 2019-10-05 ENCOUNTER — Other Ambulatory Visit: Payer: Self-pay | Admitting: Cardiology

## 2019-10-06 LAB — BASIC METABOLIC PANEL
BUN/Creatinine Ratio: 18 (ref 10–24)
BUN: 24 mg/dL (ref 8–27)
CO2: 21 mmol/L (ref 20–29)
Calcium: 9.4 mg/dL (ref 8.6–10.2)
Chloride: 101 mmol/L (ref 96–106)
Creatinine, Ser: 1.31 mg/dL — ABNORMAL HIGH (ref 0.76–1.27)
GFR calc Af Amer: 67 mL/min/{1.73_m2} (ref >59)
GFR calc non Af Amer: 58 mL/min/{1.73_m2} — ABNORMAL LOW (ref >59)
Glucose: 146 mg/dL — ABNORMAL HIGH (ref 65–99)
Potassium: 5 mmol/L (ref 3.5–5.2)
Sodium: 136 mmol/L (ref 134–144)

## 2019-10-06 LAB — SPECIMEN STATUS REPORT

## 2019-10-12 NOTE — Progress Notes (Signed)
PCP:  Shelle Iron, MD          Cardiologist:  Dr. Shirlee Latch  HPI:  Ryan Durham is a 64 y.o. male who has a history of CAD s/p CABG x 5 03/2017, systolic CHF 2/2 ICM, Afib in setting of STEMI, and tobacco abuse.   Admitted 1/6 ->04/09/17 with NSTEMI with sudden onset of chest tightness and SOB. Noted to be in rapid Afib on arrival. He spontaneously converted to NSR with treatment of his chest pain. Underwent LHC that showed critical 3vD. IABP placed with cardiogenic shock and TCTS consulted for CABG. Pt underwent CABG 03/31/2017 with no immediate complications. Pressors weaned and extubated as tolerated. Weaned off IABP POD #1. Pt diuresed with IV furosemide and HF medications adjusted as tolerated. Discharge weight 217 lbs.   Medtronic ICD placed in 5/19. Echo in 5/20 with EF 20-25%.  Echo was done today and reviewed, EF 30-35%, global HK, normal RV size and systolic function.   He recently returned to HF Clinic for follow up with Dr. Shirlee Latch on 09/15/19.  Seemed to be doing well in general.  Back at work full time.  No significant dyspnea walking on flat ground.  No chest pain.  No orthopnea/PND.  No lightheadedness.  Tolerating all meds.   Today he returns to HF clinic for pharmacist medication titration. At last visit with MD, Sherryll Burger was increased to 49/51 mg BID. Overall he is feeling well today. Notes he is tired some days, but this is related to his work schedule. Notes occasional dizziness that usually occurs once per week and lasts only a few seconds. No CP or palpitations. No SOB/DOE. His weights at home have been stable at 214-216 lbs. He does not take any diuretic. No LEE, PND or orthopnea. Takes all medications as prescribed and tolerating all medications.    HF Medications: Carvedilol 12.5 mg BID Entresto 49/51 mg BID Spironolactone 25 mg daily Jardiance 10 mg daily  Has the patient been experiencing any side effects to the medications prescribed?  no  Does the patient have any  problems obtaining medications due to transportation or finances?   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   Understanding of regimen: good Understanding of indications: good Potential of compliance: good Patient understands to avoid NSAIDs. Patient understands to avoid decongestants.    Pertinent Lab Values: Serum creatinine 1.31, BUN 24, Potassium 5.0, Sodium 136  Vital Signs: . Weight: 219.6 lbs (last clinic weight: 215 lbs) . Blood pressure: 101/72  . Heart rate: 75   Assessment: 1. Chronic systolic CHF: ECHO 03/30/2017 EF 25-30%. Ischemic cardiomyopathy, now s/p CABG as below. Repeat echo in 4/19 with EF still low at 30-35% and echo in 5/20 showed EF 20-25%. Echo 09/15/19 showed EF 30-35%.  He now has a Medtronic ICD.  - NYHA class I-II symptoms. He is not volume overloaded on exam.    - Increase carvedilol to 18.75 mg BID x 5 days, then increase to 25 mg BID.   - Continue Entresto 49/51 mg BID.  -Continue spironolactone 25 mg daily. - Continue empagliflozin 10 mg daily.   2. LKT:GYBWLS FH premature CAD.NSTEMI, now s/p CABG x 5 03/31/2017. No chest pain.  - Continue ASA 81and statin.  Good lipids in 11/20.  3. Atrial fibrillation: Paroxysmal. Only noted at initial admission in setting of severe CP/NSTEMI.No palpitations. No recent atrial fibrillation by device interrogation. - If recurs, he will need to be anticoagulated (holding off for now). 4. Smoking: Has not smoked since 1/19.  5. Carotid bruit:Moderate RICA stenosis on 11/20 dopplers, repeat in 11/21.    Plan: 1) Medication changes: Based on clinical presentation, vital signs and recent labs will increase carvedilol to 18.75 mg BID x5 days then increase to 25 mg BID.  3) Follow-up: 1 month with Dr. Shirlee Latch.    Karle Plumber, PharmD, BCPS, BCCP, CPP [facility] Clinic Pharmacist [phone]

## 2019-10-17 ENCOUNTER — Encounter: Payer: Self-pay | Admitting: Cardiology

## 2019-10-17 ENCOUNTER — Ambulatory Visit (INDEPENDENT_AMBULATORY_CARE_PROVIDER_SITE_OTHER): Payer: 59 | Admitting: Cardiology

## 2019-10-17 ENCOUNTER — Other Ambulatory Visit: Payer: Self-pay

## 2019-10-17 VITALS — BP 94/56 | HR 64 | Ht 70.0 in | Wt 221.4 lb

## 2019-10-17 DIAGNOSIS — I255 Ischemic cardiomyopathy: Secondary | ICD-10-CM | POA: Diagnosis not present

## 2019-10-17 DIAGNOSIS — I480 Paroxysmal atrial fibrillation: Secondary | ICD-10-CM | POA: Diagnosis not present

## 2019-10-17 NOTE — Patient Instructions (Signed)
Medication Instructions:  Your physician recommends that you continue on your current medications as directed. Please refer to the Current Medication list given to you today.  *If you need a refill on your cardiac medications before your next appointment, please call your pharmacy*   Lab Work: None ordered.  If you have labs (blood work) drawn today and your tests are completely normal, you will receive your results only by: MyChart Message (if you have MyChart) OR A paper copy in the mail If you have any lab test that is abnormal or we need to change your treatment, we will call you to review the results.   Testing/Procedures: None ordered.    Follow-Up: At [facility], you and your health needs are our priority.  As part of our continuing mission to provide you with exceptional heart care, we have created designated Provider Care Teams.  These Care Teams include your primary Cardiologist (physician) and Advanced Practice Providers (APPs -  Physician Assistants and Nurse Practitioners) who all work together to provide you with the care you need, when you need it.  We recommend signing up for the patient portal called "MyChart".  Sign up information is provided on this After Visit Summary.  MyChart is used to connect with patients for Virtual Visits (Telemedicine).  Patients are able to view lab/test results, encounter notes, upcoming appointments, etc.  Non-urgent messages can be sent to your provider as well.   To learn more about what you can do with MyChart, go to [email or web address].    Your next appointment:   12 month(s)  The format for your next appointment:   In Person  Provider:   Will Camnitz, MD    

## 2019-10-17 NOTE — Progress Notes (Signed)
Electrophysiology Office Note   Date:  10/17/2019   ID:  Ryan Durham, DOB 1955/10/14, MRN [id number]  PCP:  Shelle Iron, MD  Cardiologist:  Shirlee Latch Primary Electrophysiologist:  Regan Lemming, MD    No chief complaint on file.    History of Present Illness: Ryan Durham is a 65 y.o. male who is being seen today for the evaluation of CHF at the request of Marca Ancona. Presenting today for electrophysiology evaluation.  Is a history of coronary artery disease status post CABG 03/2017, systolic heart failure secondary to ischemic cardiomyopathy, atrial fibrillation in the setting of STEMI, and tobacco abuse.  In January 2019 he had a sudden onset of chest tightness and shortness of breath.  He was noted to be in rapid atrial fibrillation.  Left heart catheterization showed critical three-vessel disease.  He had a 5 vessel CABG.  At this point, he walks between 30 minutes, 1/2 miles on most days on his treadmill.  He walks his dog.  No significant chest pain or dyspnea.  An echo showed an EF of 30 to 35%.  Today, denies symptoms of palpitations, chest pain, shortness of breath, orthopnea, PND, lower extremity edema, claudication, dizziness, presyncope, syncope, bleeding, or neurologic sequela. The patient is tolerating medications without difficulties.  He continues to feel well.  He has no chest pain or shortness of breath.  He is able to do all of his daily activities without restriction.   Past Medical History:  Diagnosis Date   Acute on chronic systolic heart failure (HCC) 03/29/2017   Anginal pain (HCC)    this admission   Cardiomyopathy, ischemic 03/30/2017   Coronary artery disease    this admission   Coronary artery disease involving native coronary artery of native heart with unstable angina pectoris (HCC) 03/29/2017   Dyspnea    this admission   Dysrhythmia    afib with this admission/runs of VTACH/Afib both with this admission   Hyperlipidemia    Myocardial  infarction [facility])    this admission   Paroxysmal atrial fibrillation (HCC) 03/29/2017   Tobacco abuse    Type II diabetes mellitus (HCC) 03/30/2017   Past Surgical History:  Procedure Laterality Date   CLIPPING OF ATRIAL APPENDAGE Left 03/31/2017   Procedure: CLIPPING OF ATRIAL APPENDAGE;  Surgeon: Purcell Nails, MD;  Location: [facility] OR;  Service: Open Heart Surgery;  Laterality: Left;   CORONARY ANGIOPLASTY  03/30/2017   CORONARY ARTERY BYPASS GRAFT N/A 03/31/2017   Procedure: CORONARY ARTERY BYPASS GRAFTING (CABG) time 5 using bilateral saphaneous vien, harvested endoscopicly and right internal mammary artery.;  Surgeon: Purcell Nails, MD;  Location: [facility] OR;  Service: Open Heart Surgery;  Laterality: N/A;   CORONARY/GRAFT ACUTE MI REVASCULARIZATION N/A 03/29/2017   Procedure: Coronary/Graft Acute MI Revascularization;  Surgeon: Kathleene Hazel, MD;  Location: [facility] INVASIVE CV LAB;  Service: Cardiovascular;  Laterality: N/A;   IABP INSERTION N/A 03/29/2017   Procedure: IABP Insertion;  Surgeon: Kathleene Hazel, MD;  Location: [facility] INVASIVE CV LAB;  Service: Cardiovascular;  Laterality: N/A;   ICD IMPLANT N/A 07/23/2017   Procedure: ICD IMPLANT;  Surgeon: Regan Lemming, MD;  Location: [facility] INVASIVE CV LAB;  Service: Cardiovascular;  Laterality: N/A;   LEFT HEART CATH AND CORONARY ANGIOGRAPHY N/A 03/29/2017   Procedure: LEFT HEART CATH AND CORONARY ANGIOGRAPHY;  Surgeon: Kathleene Hazel, MD;  Location: [facility] INVASIVE CV LAB;  Service: Cardiovascular;  Laterality: N/A;   TEE WITHOUT CARDIOVERSION N/A 03/31/2017  Procedure: TRANSESOPHAGEAL ECHOCARDIOGRAM (TEE);  Surgeon: Purcell Nails, MD;  Location: [facility] OR;  Service: Open Heart Surgery;  Laterality: N/A;     Current Outpatient Medications  Medication Sig Dispense Refill   aspirin EC 81 MG EC tablet Take 1 tablet (81 mg total) by mouth daily.     atorvastatin (LIPITOR) 80 MG tablet TAKE 1 TABLET(80 MG) BY MOUTH DAILY AT  6 PM 30 tablet 6   carvedilol (COREG) 12.5 MG tablet Take 1 tablet (12.5 mg total) by mouth 2 (two) times daily with a meal. 180 tablet 3   empagliflozin (JARDIANCE) 10 MG TABS tablet Take 10 mg by mouth daily before breakfast. 30 tablet 11   sacubitril-valsartan (ENTRESTO) 49-51 MG Take 1 tablet by mouth 2 (two) times daily. 60 tablet 3   spironolactone (ALDACTONE) 25 MG tablet Take 1 tablet (25 mg total) by mouth daily. 90 tablet 3   No current facility-administered medications for this visit.    Allergies:   Patient has no known allergies.   Social History:  The patient  reports that he has quit smoking. His smoking use included cigarettes. He has a 30.00 pack-year smoking history. He has never used smokeless tobacco. He reports that he does not drink alcohol and does not use drugs.   Family History:  The patient's family history includes Cirrhosis in his mother; Heart attack in his sister; Myocarditis in his brother.   ROS:  Please see the history of present illness.   Otherwise, review of systems is positive for none.   All other systems are reviewed and negative.   PHYSICAL EXAM: VS:  BP (!) 94/56    Pulse 64    Ht 5\' 10"  (1.778 m)    Wt (!) 221 lb 6.4 oz (100.4 kg)    SpO2 98%    BMI 31.77 kg/m  , BMI Body mass index is 31.77 kg/m. GEN: Well nourished, well developed, in no acute distress  HEENT: normal  Neck: no JVD, carotid bruits, or masses Cardiac: RRR; no murmurs, rubs, or gallops,no edema  Respiratory:  clear to auscultation bilaterally, normal work of breathing GI: soft, nontender, nondistended, + BS MS: no deformity or atrophy  Skin: warm and dry, device site well healed Neuro:  Strength and sensation are intact Psych: euthymic mood, full affect  EKG:  EKG is not ordered today. Personal review of the ekg ordered 06/13/19 shows sinus rhythm, septal infarct, rate 73  Personal review of the device interrogation today. Results in [facility]   Recent Labs: 02/14/2019:  Magnesium 2.1 06/13/2019: Hemoglobin 15.7; Platelets 173 10/05/2019: BUN 24; Creatinine, Ser 1.31; Potassium 5.0; Sodium 136    Lipid Panel     Component Value Date/Time   CHOL 133 02/14/2019 1042   TRIG 96 02/14/2019 1042   HDL 43 02/14/2019 1042   CHOLHDL 3.1 02/14/2019 1042   VLDL 19 02/14/2019 1042   LDLCALC 71 02/14/2019 1042     Wt Readings from Last 3 Encounters:  10/17/19 (!) 221 lb 6.4 oz (100.4 kg)  09/15/19 215 lb (97.5 kg)  06/13/19 211 lb 12.8 oz (96.1 kg)      Other studies Reviewed: Additional studies/ records that were reviewed today include: TTE 09/15/2019  Review of the above records today demonstrates:  1. Left ventricular ejection fraction, by estimation, is 30 to 35%. The  left ventricle has moderately decreased function. The left ventricle  demonstrates global hypokinesis. Left ventricular diastolic parameters are  consistent with Grade II diastolic  dysfunction (  pseudonormalization).  2. Right ventricular systolic function is normal. The right ventricular  size is normal. Tricuspid regurgitation signal is inadequate for assessing  PA pressure.  3. The mitral valve is normal in structure. No evidence of mitral valve  regurgitation. No evidence of mitral stenosis.  4. The aortic valve is tricuspid. Aortic valve regurgitation is not  visualized. Mild to moderate aortic valve sclerosis/calcification is  present, without any evidence of aortic stenosis.  5. The inferior vena cava is normal in size with greater than 50%  respiratory variability, suggesting right atrial pressure of 3 mmHg.  LHC 03/30/17  Prox RCA lesion is 80% stenosed.  Prox RCA to Dist RCA lesion is 30% stenosed.  Ost RPDA lesion is 99% stenosed.  Post Atrio lesion is 80% stenosed.  Dist LM to Ost LAD lesion is 100% stenosed.  Ost 1st Mrg lesion is 100% stenosed.  Ost Cx to Prox Cx lesion is 99% stenosed.  Ost 2nd Mrg to 2nd Mrg lesion is 70% stenosed.   ASSESSMENT AND  PLAN:  1.  Systolic heart failure due to ischemic cardiomyopathy: Currently on optimal medical therapy.  Status post Medtronic ICD implanted 07/23/2017.  His impedance has been going off and his R waves are low, though his device is continuing to function appropriately.  We Shandrell Boda continue to monitor without changes.  2.  Coronary artery disease status post CABG: No current chest pain.  Continue aspirin, Plavix, statin.  3.  Atrial fibrillation, paroxysmal: Occurred in the setting of his STEMI.  He has had no further episodes on device interrogation.  Elize Pinon require anticoagulation if he has further episodes.  4.  Tobacco abuse: Continue to encourage alcohol cessation    Current medicines are reviewed at length with the patient today.   The patient does not have concerns regarding his medicines.  The following changes were made today: None  Labs/ tests ordered today include:  No orders of the defined types were placed in this encounter.   Disposition:   FU with Nawaal Alling at 12 months  Signed, Kendyl Festa Jorja Loa, MD  10/17/2019 11:46 AM     [facility] [address] [place] [address] [phone] (office) [phone] (fax)

## 2019-10-19 ENCOUNTER — Other Ambulatory Visit (HOSPITAL_COMMUNITY): Payer: Self-pay | Admitting: Cardiology

## 2019-10-24 ENCOUNTER — Ambulatory Visit (HOSPITAL_COMMUNITY)
Admission: RE | Admit: 2019-10-24 | Discharge: 2019-10-24 | Disposition: A | Discharge status: Home / Self Care | Payer: 59 | Source: Ambulatory Visit | Attending: Cardiology | Admitting: Cardiology

## 2019-10-24 ENCOUNTER — Encounter (HOSPITAL_COMMUNITY): Payer: Self-pay

## 2019-10-24 ENCOUNTER — Other Ambulatory Visit: Payer: Self-pay

## 2019-10-24 VITALS — BP 101/72 | HR 75 | Wt 219.6 lb

## 2019-10-24 DIAGNOSIS — Z7901 Long term (current) use of anticoagulants: Secondary | ICD-10-CM | POA: Insufficient documentation

## 2019-10-24 DIAGNOSIS — I480 Paroxysmal atrial fibrillation: Secondary | ICD-10-CM | POA: Diagnosis not present

## 2019-10-24 DIAGNOSIS — R0989 Other specified symptoms and signs involving the circulatory and respiratory systems: Secondary | ICD-10-CM | POA: Diagnosis not present

## 2019-10-24 DIAGNOSIS — R42 Dizziness and giddiness: Secondary | ICD-10-CM | POA: Diagnosis present

## 2019-10-24 DIAGNOSIS — Z79899 Other long term (current) drug therapy: Secondary | ICD-10-CM | POA: Insufficient documentation

## 2019-10-24 DIAGNOSIS — I2510 Atherosclerotic heart disease of native coronary artery without angina pectoris: Secondary | ICD-10-CM | POA: Diagnosis not present

## 2019-10-24 DIAGNOSIS — Z87891 Personal history of nicotine dependence: Secondary | ICD-10-CM | POA: Insufficient documentation

## 2019-10-24 DIAGNOSIS — I255 Ischemic cardiomyopathy: Secondary | ICD-10-CM | POA: Insufficient documentation

## 2019-10-24 DIAGNOSIS — I5022 Chronic systolic (congestive) heart failure: Secondary | ICD-10-CM | POA: Diagnosis not present

## 2019-10-24 DIAGNOSIS — Z951 Presence of aortocoronary bypass graft: Secondary | ICD-10-CM | POA: Insufficient documentation

## 2019-10-24 DIAGNOSIS — I252 Old myocardial infarction: Secondary | ICD-10-CM | POA: Diagnosis not present

## 2019-10-24 MED ORDER — CARVEDILOL 25 MG PO TABS: 25.0000 mg | ORAL_TABLET | Freq: Two times a day (BID) | ORAL | 3 refills | Status: DC

## 2019-10-24 NOTE — Patient Instructions (Addendum)
It was a pleasure seeing you today!  MEDICATIONS: -We are changing your medications today -Increase carvedilol to 18.75 mg (1.5 tabs) twice daily for 5 days, then increase to carvedilol 25 mg twice daily.  -Call if you have questions about your medications.   NEXT APPOINTMENT: Return to clinic in 1 month with Dr. Shirlee Latch.  In general, to take care of your heart failure: -Limit your fluid intake to 2 Liters (half-gallon) per day.   -Limit your salt intake to ideally 2-3 grams (2000-3000 mg) per day. -Weigh yourself daily and record, and bring that "weight diary" to your next appointment.  (Weight gain of 2-3 pounds in 1 day typically means fluid weight.) -The medications for your heart are to help your heart and help you live longer.   -Please contact us before stopping any of your heart medications.  Call the clinic at [phone] with questions or to reschedule future appointments.

## 2019-11-01 ENCOUNTER — Ambulatory Visit (INDEPENDENT_AMBULATORY_CARE_PROVIDER_SITE_OTHER): Payer: 59 | Admitting: *Deleted

## 2019-11-01 DIAGNOSIS — I255 Ischemic cardiomyopathy: Secondary | ICD-10-CM

## 2019-11-02 LAB — CUP PACEART REMOTE DEVICE CHECK
Battery Remaining Longevity: 104 mo
Battery Voltage: 2.99 V
Brady Statistic RV Percent Paced: 0.02 %
Date Time Interrogation Session: 20210810202830
HighPow Impedance: 84 Ohm
Implantable Lead Implant Date: 20190502
Implantable Lead Location: 753860
Implantable Pulse Generator Implant Date: 20190502
Lead Channel Impedance Value: 1159 Ohm
Lead Channel Impedance Value: 817 Ohm
Lead Channel Pacing Threshold Amplitude: 1.625 V
Lead Channel Pacing Threshold Pulse Width: 0.4 ms
Lead Channel Sensing Intrinsic Amplitude: 2.375 mV
Lead Channel Sensing Intrinsic Amplitude: 2.375 mV
Lead Channel Setting Pacing Amplitude: 3.25 V
Lead Channel Setting Pacing Pulse Width: 0.4 ms
Lead Channel Setting Sensing Sensitivity: 0.3 mV

## 2019-11-03 NOTE — Progress Notes (Signed)
Remote ICD transmission.   

## 2019-12-12 ENCOUNTER — Other Ambulatory Visit: Payer: Self-pay

## 2019-12-12 ENCOUNTER — Ambulatory Visit (HOSPITAL_COMMUNITY)
Admission: RE | Admit: 2019-12-12 | Discharge: 2019-12-12 | Disposition: A | Discharge status: Home / Self Care | Payer: 59 | Source: Ambulatory Visit | Attending: Cardiology | Admitting: Cardiology

## 2019-12-12 VITALS — BP 110/65 | HR 62 | Wt 219.6 lb

## 2019-12-12 DIAGNOSIS — Z8249 Family history of ischemic heart disease and other diseases of the circulatory system: Secondary | ICD-10-CM | POA: Insufficient documentation

## 2019-12-12 DIAGNOSIS — I255 Ischemic cardiomyopathy: Secondary | ICD-10-CM | POA: Diagnosis not present

## 2019-12-12 DIAGNOSIS — I5022 Chronic systolic (congestive) heart failure: Secondary | ICD-10-CM | POA: Insufficient documentation

## 2019-12-12 DIAGNOSIS — Z87891 Personal history of nicotine dependence: Secondary | ICD-10-CM | POA: Insufficient documentation

## 2019-12-12 DIAGNOSIS — Z95810 Presence of automatic (implantable) cardiac defibrillator: Secondary | ICD-10-CM | POA: Diagnosis not present

## 2019-12-12 DIAGNOSIS — Z7982 Long term (current) use of aspirin: Secondary | ICD-10-CM | POA: Insufficient documentation

## 2019-12-12 DIAGNOSIS — I480 Paroxysmal atrial fibrillation: Secondary | ICD-10-CM | POA: Diagnosis not present

## 2019-12-12 DIAGNOSIS — I6523 Occlusion and stenosis of bilateral carotid arteries: Secondary | ICD-10-CM

## 2019-12-12 DIAGNOSIS — I2510 Atherosclerotic heart disease of native coronary artery without angina pectoris: Secondary | ICD-10-CM | POA: Diagnosis not present

## 2019-12-12 DIAGNOSIS — Z79899 Other long term (current) drug therapy: Secondary | ICD-10-CM | POA: Insufficient documentation

## 2019-12-12 DIAGNOSIS — Z951 Presence of aortocoronary bypass graft: Secondary | ICD-10-CM | POA: Diagnosis not present

## 2019-12-12 DIAGNOSIS — I252 Old myocardial infarction: Secondary | ICD-10-CM | POA: Diagnosis not present

## 2019-12-12 LAB — BASIC METABOLIC PANEL
Anion gap: 8 (ref 5–15)
BUN: 23 mg/dL (ref 8–23)
CO2: 24 mmol/L (ref 22–32)
Calcium: 9.6 mg/dL (ref 8.9–10.3)
Chloride: 105 mmol/L (ref 98–111)
Creatinine, Ser: 1.18 mg/dL (ref 0.61–1.24)
GFR calc Af Amer: >60 mL/min (ref >60)
GFR calc non Af Amer: >60 mL/min (ref >60)
Glucose, Bld: 118 mg/dL — ABNORMAL HIGH (ref 70–99)
Potassium: 4.6 mmol/L (ref 3.5–5.1)
Sodium: 137 mmol/L (ref 135–145)

## 2019-12-12 LAB — MAGNESIUM: Magnesium: 2.3 mg/dL (ref 1.7–2.4)

## 2019-12-12 MED ORDER — ENTRESTO 97-103 MG PO TABS: 1.0000 | ORAL_TABLET | Freq: Two times a day (BID) | ORAL | 6 refills | Status: DC

## 2019-12-12 NOTE — Progress Notes (Signed)
Date:  12/12/2019   ID:  Ryan Durham, DOB 05-09-1955, MRN [id number]   Provider location: [facility] Type of Visit: Established patient  PCP:  Shelle Iron, MD  Cardiologist:  Dr. Shirlee Latch   History of Present Illness: Ryan Durham is a 64 y.o. male who has a history of CAD s/p CABG x 5 03/2017, systolic CHF 2/2 ICM, Afib in setting of STEMI, and tobacco abuse.   Admitted 1/6 -> 04/09/17 with NSTEMI with sudden onset of chest tightness and SOB. Noted to be in rapid Afib on arrival. He spontaneously converted to NSR with treatment of his chest pain. Underwent LHC that showed critical 3vD. IABP placed with cardiogenic shock and TCTS consulted for CABG. Pt underwent CABG 03/31/2017 with no immediate complications. Pressors weaned and extubated as tolerated. Weaned off IABP POD #1. Pt diuresed with IV lasix and HF medications adjusted as tolerated.  Discharge weight 217 lbs.   Medtronic ICD placed in 5/19. Echo in 5/20 with EF 20-25%.  Echo in 6/21 with EF 30-35%, global HK, normal RV size and systolic function.   He returns for followup of CHF.  No significant exertional dyspnea now.  No chest pain.  No orthopnea/PND.  No claudication.  Main complaint is likely right shoulder rotator cuff injury.   Medtronic device interrogation: Stable thoracic impedance currently, no AF or VT.   ECG (personally reviewed): NSR, LAFB, lateral TWIs  Labs (3/19): LDL 56, HDL 26, K 4.3, creatinine 4.09 Labs (5/19): K 5, creatinine 0.94  Labs (8/19): K 4.7, creatinine 1.05 Labs (11/19): K 4.1, creatinine 0.9 Labs (2/20): LDL 59, HDl 44, K 4.2, ceatinine 1.09 Labs (4/20): hgb 15.3, K 5, creatinine 1.09, LDL 61 Labs (8/20): K 4.9, creatinine 1.27 Labs (11/20): LDL 71, HDL 43 Labs (12/20): K 4.6, creatinine 1.18 Labs (6/21): K 4.5, creatinine 1.05 Labs (7/21): K 5, creatinine 1.31  Past Medical History 1. Chronic systolic CHF: ECHO 03/30/2017 EF 25-30%. Ischemic cardiomyopathy,  now s/p CABG.  - Echo (4/19): EF 30-35%, wall motion abnormalities, grade 2 diastolic dysfunction, normal RV size and systolic function.  - Medtronic ICD.  - Echo (5/20): EF 20-25%, normal RV size and systolic function.  - Echo (6/21): EF 30-35%, global HK, normal RV size and systolic function.  2. WJX:BJYNWG FH premature CAD.NSTEMI 1/19, now s/p CABG 03/2017 3. Atrial fibrillation: Paroxysmal. Noted only in setting of NSTEMI.  4. Smoking: Has quit.  5. Carotid stenosis: Carotid dopplers (11/19) with 40-59% BICA stenosis.  - Carotid dopplers (11/20): 40-59% RICA  Current Outpatient Medications  Medication Sig Dispense Refill  . aspirin EC 81 MG EC tablet Take 1 tablet (81 mg total) by mouth daily.    [name] atorvastatin (LIPITOR) 80 MG tablet TAKE 1 TABLET(80 MG) BY MOUTH DAILY AT 6 PM 30 tablet 6  . carvedilol (COREG) 25 MG tablet Take 1 tablet (25 mg total) by mouth 2 (two) times daily with a meal. 90 tablet 3  . empagliflozin (JARDIANCE) 10 MG TABS tablet Take 10 mg by mouth daily before breakfast. 30 tablet 11  . spironolactone (ALDACTONE) 25 MG tablet Take 1 tablet (25 mg total) by mouth daily. 90 tablet 3  . sacubitril-valsartan (ENTRESTO) 97-103 MG Take 1 tablet by mouth 2 (two) times daily. 60 tablet 6   No current facility-administered medications for this encounter.    Allergies:   Patient has no known allergies.   Social History:  The patient  reports that he has quit  smoking. His smoking use included cigarettes. He has a 30.00 pack-year smoking history. He has never used smokeless tobacco. He reports that he does not drink alcohol and does not use drugs.   Family History:  The patient's family history includes Cirrhosis in his mother; Heart attack in his sister; Myocarditis in his brother.   ROS:  Please see the history of present illness.   All other systems are personally reviewed and negative.   Exam:   BP 110/65   Pulse 62   Wt 99.6 kg (219 lb 9.6 oz)   SpO2 96%   BMI  31.51 kg/m  General: NAD Neck: No JVD, no thyromegaly or thyroid nodule.  Lungs: Clear to auscultation bilaterally with normal respiratory effort. CV: Nondisplaced PMI.  Heart regular S1/S2, no S3/S4, no murmur.  No peripheral edema.  No carotid bruit.  Normal pedal pulses.  Abdomen: Soft, nontender, no hepatosplenomegaly, no distention.  Skin: Intact without lesions or rashes.  Neurologic: Alert and oriented x 3.  Psych: Normal affect. Extremities: No clubbing or cyanosis.  HEENT: Normal.   Recent Labs: 06/13/2019: Hemoglobin 15.7; Platelets 173 12/12/2019: BUN 23; Creatinine, Ser 1.18; Magnesium 2.3; Potassium 4.6; Sodium 137  Personally reviewed   Wt Readings from Last 3 Encounters:  12/12/19 99.6 kg (219 lb 9.6 oz)  10/24/19 99.6 kg (219 lb 9.6 oz)  10/17/19 (!) 100.4 kg (221 lb 6.4 oz)      ASSESSMENT AND PLAN:  1. Chronic systolic CHF: ECHO 03/30/2017 EF 25-30%. Ischemic cardiomyopathy, now s/p CABG as below. Repeat echo in 4/19 with EF still low at 30-35% and echo in 5/20 showed EF 20-25%.  Echo in 6/21 showed EF 30-35%.  He now has a Medtronic ICD.  NYHA class I-II symptoms. He is not volume overloaded on exam or by Optivol.   - Continue Coreg 12.5 mg bid.    - Increase Entresto to 97/103 bid. BMET today and in 10 days.  - Continue spironolactone 25 mg daily.  - Continue dapagliflozin 10 mg daily.   2. YYT:KPTWSF FH premature CAD.NSTEMI, now s/p CABG x 5 03/31/2017.  No chest pain.   - Continue ASA 81 and statin.  Check lipids next appt.  3. Atrial fibrillation: Paroxysmal. Only noted at initial admission in setting of severe CP/NSTEMI.No palpitations. No recent atrial fibrillation by device interrogation. NSR today.  - If recurs, he will need to be anticoagulated (holding off for now). 4. Smoking: Has not smoked since 1/19.  5. Carotid bruit: Moderate RICA stenosis on 11/20 dopplers, repeat in 11/21.   Followup in 4 months    Signed, Marca Ancona, MD   12/12/2019  [facility] [address] [facility] [place] [address] [phone] (office) [phone] (fax)

## 2019-12-12 NOTE — Patient Instructions (Signed)
Increase Entresto to 97/103 mg Twice daily   Labs done today, your results will be available in MyChart, we will contact you for abnormal readings.  Your physician recommends that you return for lab work in: 10 days, we have provided you a prescription to have this done locally  Your physician has requested that you have a carotid duplex. This test is an ultrasound of the carotid arteries in your neck. It looks at blood flow through these arteries that supply the brain with blood. Allow one hour for this exam. There are no restrictions or special instructions.  Your physician recommends that you schedule a follow-up appointment in: 4 months  If you have any questions or concerns before your next appointment please send [place] a message through [place] or call our office at [phone].    TO LEAVE A MESSAGE FOR THE NURSE SELECT OPTION 2, PLEASE LEAVE A MESSAGE INCLUDING: . YOUR NAME . DATE OF BIRTH . CALL BACK NUMBER . REASON FOR CALL**this is important as we prioritize the call backs  YOU WILL RECEIVE A CALL BACK THE SAME DAY AS LONG AS YOU CALL BEFORE 4:00 PM  At the [facility], you and your health needs are our priority. As part of our continuing mission to provide you with exceptional heart care, we have created designated Provider Care Teams. These Care Teams include your primary Cardiologist (physician) and Advanced Practice Providers (APPs- Physician Assistants and Nurse Practitioners) who all work together to provide you with the care you need, when you need it.   You may see any of the following providers on your designated Care Team at your next follow up: [name] Dr Arvilla Meres . Dr Marca Ancona . Tonye Becket, NP . Robbie Lis, PA . Karle Plumber, PharmD   Please be sure to bring in all your medications bottles to every appointment.

## 2019-12-14 NOTE — Addendum Note (Signed)
Encounter addended by: Noralee Space, RN on: 12/14/2019 2:04 PM  Actions taken: Visit diagnoses modified, Order list changed, Diagnosis association updated

## 2020-01-02 ENCOUNTER — Other Ambulatory Visit: Payer: Self-pay | Admitting: Cardiology

## 2020-01-03 ENCOUNTER — Other Ambulatory Visit (HOSPITAL_COMMUNITY): Payer: Self-pay | Admitting: Cardiology

## 2020-01-03 ENCOUNTER — Other Ambulatory Visit: Payer: Self-pay

## 2020-01-03 ENCOUNTER — Ambulatory Visit (HOSPITAL_COMMUNITY)
Admission: RE | Admit: 2020-01-03 | Discharge: 2020-01-03 | Disposition: A | Discharge status: Home / Self Care | Payer: 59 | Source: Ambulatory Visit | Attending: Cardiovascular Disease | Admitting: Cardiovascular Disease

## 2020-01-03 DIAGNOSIS — I6523 Occlusion and stenosis of bilateral carotid arteries: Secondary | ICD-10-CM | POA: Diagnosis present

## 2020-01-03 LAB — BASIC METABOLIC PANEL
BUN/Creatinine Ratio: 20 (ref 10–24)
BUN: 25 mg/dL (ref 8–27)
CO2: 24 mmol/L (ref 20–29)
Calcium: 9.2 mg/dL (ref 8.6–10.2)
Chloride: 102 mmol/L (ref 96–106)
Creatinine, Ser: 1.22 mg/dL (ref 0.76–1.27)
GFR calc Af Amer: 74 mL/min/{1.73_m2} (ref >59)
GFR calc non Af Amer: 64 mL/min/{1.73_m2} (ref >59)
Glucose: 166 mg/dL — ABNORMAL HIGH (ref 65–99)
Potassium: 4.8 mmol/L (ref 3.5–5.2)
Sodium: 138 mmol/L (ref 134–144)

## 2020-01-03 LAB — SPECIMEN STATUS REPORT

## 2020-01-04 ENCOUNTER — Telehealth (HOSPITAL_COMMUNITY): Payer: Self-pay

## 2020-01-04 NOTE — Telephone Encounter (Signed)
-----   Message from Laurey Morale, MD sent at 01/04/2020  1:05 PM EDT ----- 40-59% BICA stenosis, repeat dopplers in 1 year.

## 2020-01-04 NOTE — Telephone Encounter (Signed)
Samara Snide, RN  01/04/2020 1:14 PM EDT Back to Top    Patient's wife advised and verbalized understanding

## 2020-01-05 ENCOUNTER — Other Ambulatory Visit (HOSPITAL_COMMUNITY): Payer: Self-pay | Admitting: Cardiology

## 2020-01-07 ENCOUNTER — Other Ambulatory Visit (HOSPITAL_COMMUNITY): Payer: Self-pay | Admitting: Cardiology

## 2020-01-31 ENCOUNTER — Ambulatory Visit (INDEPENDENT_AMBULATORY_CARE_PROVIDER_SITE_OTHER): Payer: 59

## 2020-01-31 DIAGNOSIS — I255 Ischemic cardiomyopathy: Secondary | ICD-10-CM

## 2020-02-01 LAB — CUP PACEART REMOTE DEVICE CHECK
Battery Remaining Longevity: 99 mo
Battery Voltage: 2.99 V
Brady Statistic RV Percent Paced: 0.02 %
Date Time Interrogation Session: 20211109183325
HighPow Impedance: 83 Ohm
Implantable Lead Implant Date: 20190502
Implantable Lead Location: 753860
Implantable Pulse Generator Implant Date: 20190502
Lead Channel Impedance Value: 1216 Ohm
Lead Channel Impedance Value: 912 Ohm
Lead Channel Pacing Threshold Amplitude: 1.75 V
Lead Channel Pacing Threshold Pulse Width: 0.4 ms
Lead Channel Sensing Intrinsic Amplitude: 2.625 mV
Lead Channel Sensing Intrinsic Amplitude: 2.625 mV
Lead Channel Setting Pacing Amplitude: 3.75 V
Lead Channel Setting Pacing Pulse Width: 0.4 ms
Lead Channel Setting Sensing Sensitivity: 0.3 mV

## 2020-02-01 NOTE — Progress Notes (Signed)
Remote ICD transmission.   

## 2020-03-08 ENCOUNTER — Other Ambulatory Visit (HOSPITAL_COMMUNITY): Payer: Self-pay | Admitting: Cardiology

## 2020-05-01 ENCOUNTER — Ambulatory Visit (INDEPENDENT_AMBULATORY_CARE_PROVIDER_SITE_OTHER): Payer: 59

## 2020-05-01 DIAGNOSIS — I5022 Chronic systolic (congestive) heart failure: Secondary | ICD-10-CM | POA: Diagnosis not present

## 2020-05-01 LAB — CUP PACEART REMOTE DEVICE CHECK
Battery Remaining Longevity: 91 mo
Battery Voltage: 2.99 V
Brady Statistic RV Percent Paced: 0.02 %
Date Time Interrogation Session: 20220208033423
HighPow Impedance: 78 Ohm
Implantable Lead Implant Date: 20190502
Implantable Lead Location: 753860
Implantable Pulse Generator Implant Date: 20190502
Lead Channel Impedance Value: 1292 Ohm
Lead Channel Impedance Value: 988 Ohm
Lead Channel Pacing Threshold Amplitude: 2.25 V
Lead Channel Pacing Threshold Pulse Width: 0.4 ms
Lead Channel Sensing Intrinsic Amplitude: 3.875 mV
Lead Channel Sensing Intrinsic Amplitude: 3.875 mV
Lead Channel Setting Pacing Amplitude: 4.75 V
Lead Channel Setting Pacing Pulse Width: 0.4 ms
Lead Channel Setting Sensing Sensitivity: 0.3 mV

## 2020-05-03 ENCOUNTER — Other Ambulatory Visit (HOSPITAL_COMMUNITY): Payer: Self-pay | Admitting: Cardiology

## 2020-05-07 NOTE — Progress Notes (Signed)
Remote ICD transmission.   

## 2020-06-20 ENCOUNTER — Other Ambulatory Visit (HOSPITAL_COMMUNITY): Payer: Self-pay | Admitting: Cardiology

## 2020-07-09 ENCOUNTER — Ambulatory Visit (HOSPITAL_COMMUNITY)
Admission: RE | Admit: 2020-07-09 | Discharge: 2020-07-09 | Disposition: A | Discharge status: Home / Self Care | Payer: 59 | Source: Ambulatory Visit | Attending: Cardiology | Admitting: Cardiology

## 2020-07-09 ENCOUNTER — Other Ambulatory Visit: Payer: Self-pay

## 2020-07-09 ENCOUNTER — Encounter (HOSPITAL_COMMUNITY): Payer: Self-pay | Admitting: Cardiology

## 2020-07-09 VITALS — BP 90/50 | HR 60 | Wt 224.6 lb

## 2020-07-09 DIAGNOSIS — Z79899 Other long term (current) drug therapy: Secondary | ICD-10-CM | POA: Diagnosis not present

## 2020-07-09 DIAGNOSIS — I252 Old myocardial infarction: Secondary | ICD-10-CM | POA: Insufficient documentation

## 2020-07-09 DIAGNOSIS — Z7982 Long term (current) use of aspirin: Secondary | ICD-10-CM | POA: Insufficient documentation

## 2020-07-09 DIAGNOSIS — E7849 Other hyperlipidemia: Secondary | ICD-10-CM

## 2020-07-09 DIAGNOSIS — I5022 Chronic systolic (congestive) heart failure: Secondary | ICD-10-CM | POA: Insufficient documentation

## 2020-07-09 DIAGNOSIS — Z87891 Personal history of nicotine dependence: Secondary | ICD-10-CM | POA: Diagnosis not present

## 2020-07-09 DIAGNOSIS — I480 Paroxysmal atrial fibrillation: Secondary | ICD-10-CM

## 2020-07-09 DIAGNOSIS — I2510 Atherosclerotic heart disease of native coronary artery without angina pectoris: Secondary | ICD-10-CM | POA: Diagnosis not present

## 2020-07-09 DIAGNOSIS — I25110 Atherosclerotic heart disease of native coronary artery with unstable angina pectoris: Secondary | ICD-10-CM

## 2020-07-09 DIAGNOSIS — Z8249 Family history of ischemic heart disease and other diseases of the circulatory system: Secondary | ICD-10-CM | POA: Diagnosis not present

## 2020-07-09 DIAGNOSIS — I255 Ischemic cardiomyopathy: Secondary | ICD-10-CM | POA: Diagnosis not present

## 2020-07-09 DIAGNOSIS — Z95810 Presence of automatic (implantable) cardiac defibrillator: Secondary | ICD-10-CM | POA: Insufficient documentation

## 2020-07-09 DIAGNOSIS — Z951 Presence of aortocoronary bypass graft: Secondary | ICD-10-CM | POA: Insufficient documentation

## 2020-07-09 HISTORY — DX: Heart failure, unspecified: I50.9

## 2020-07-09 LAB — LIPID PANEL
Cholesterol: 131 mg/dL (ref 0–200)
HDL: 39 mg/dL — ABNORMAL LOW (ref >40)
LDL Cholesterol: 65 mg/dL (ref 0–99)
Total CHOL/HDL Ratio: 3.4 RATIO
Triglycerides: 135 mg/dL (ref <150)
VLDL: 27 mg/dL (ref 0–40)

## 2020-07-09 LAB — BASIC METABOLIC PANEL
Anion gap: 13 (ref 5–15)
BUN: 38 mg/dL — ABNORMAL HIGH (ref 8–23)
CO2: 16 mmol/L — ABNORMAL LOW (ref 22–32)
Calcium: 9.4 mg/dL (ref 8.9–10.3)
Chloride: 107 mmol/L (ref 98–111)
Creatinine, Ser: 1.34 mg/dL — ABNORMAL HIGH (ref 0.61–1.24)
GFR, Estimated: 60 mL/min — ABNORMAL LOW (ref >60)
Glucose, Bld: 131 mg/dL — ABNORMAL HIGH (ref 70–99)
Potassium: 5.6 mmol/L — ABNORMAL HIGH (ref 3.5–5.1)
Sodium: 136 mmol/L (ref 135–145)

## 2020-07-09 MED ORDER — SPIRONOLACTONE 25 MG PO TABS: 25.0000 mg | ORAL_TABLET | Freq: Every day | ORAL | 3 refills | Status: DC

## 2020-07-09 NOTE — Progress Notes (Signed)
Date:  07/09/2020   ID:  Deland Durham, DOB 05-15-1955, MRN [id number]   Provider location: [place] [facility] Type of Visit: Established patient  PCP:  Shelle Iron, MD  Cardiologist:  Dr. Shirlee Latch   History of Present Illness: Ryan Durham is a 65 y.o. male who has a history of CAD s/p CABG x 5 03/2017, systolic CHF 2/2 ICM, Afib in setting of STEMI, and tobacco abuse.   Admitted 1/6 -> 04/09/17 with NSTEMI with sudden onset of chest tightness and SOB. Noted to be in rapid Afib on arrival. He spontaneously converted to NSR with treatment of his chest pain. Underwent LHC that showed critical 3vD. IABP placed with cardiogenic shock and TCTS consulted for CABG. Pt underwent CABG 03/31/2017 with no immediate complications. Pressors weaned and extubated as tolerated. Weaned off IABP POD #1. Pt diuresed with IV lasix and HF medications adjusted as tolerated.  Discharge weight 217 lbs.   Medtronic ICD placed in 5/19. Echo in 5/20 with EF 20-25%.  Echo in 6/21 with EF 30-35%, global HK, normal RV size and systolic function.   He returns for followup of CHF.  No significant exertional dyspnea.  No chest pain.  No orthopnea/PND.  Occasionally gets lightheaded when he stands, no falls or syncope.   Medtronic device interrogation: Stable thoracic impedance currently, no AF/VT  Labs (3/19): LDL 56, HDL 26, K 4.3, creatinine 9.83 Labs (5/19): K 5, creatinine 0.94  Labs (8/19): K 4.7, creatinine 1.05 Labs (11/19): K 4.1, creatinine 0.9 Labs (2/20): LDL 59, HDl 44, K 4.2, ceatinine 1.09 Labs (4/20): hgb 15.3, K 5, creatinine 1.09, LDL 61 Labs (8/20): K 4.9, creatinine 1.27 Labs (11/20): LDL 71, HDL 43 Labs (12/20): K 4.6, creatinine 1.18 Labs (6/21): K 4.5, creatinine 1.05 Labs (7/21): K 5, creatinine 1.31 Labs (10/21): K 4.8, creatinine 1.22  Past Medical History 1. Chronic systolic CHF: ECHO 03/30/2017 EF 25-30%. Ischemic cardiomyopathy, now s/p CABG.  - Echo (4/19): EF  30-35%, wall motion abnormalities, grade 2 diastolic dysfunction, normal RV size and systolic function.  - Medtronic ICD.  - Echo (5/20): EF 20-25%, normal RV size and systolic function.  - Echo (6/21): EF 30-35%, global HK, normal RV size and systolic function.  2. JAS:NKNLZJ FH premature CAD.NSTEMI 1/19, now s/p CABG 03/2017 3. Atrial fibrillation: Paroxysmal. Noted only in setting of NSTEMI.  4. Smoking: Has quit.  5. Carotid stenosis: Carotid dopplers (11/19) with 40-59% BICA stenosis.  - Carotid dopplers (11/20): 40-59% RICA - Carotid doppers (10/21): 40-59% BICA  Current Outpatient Medications  Medication Sig Dispense Refill  . aspirin EC 81 MG EC tablet Take 1 tablet (81 mg total) by mouth daily.    [name] atorvastatin (LIPITOR) 80 MG tablet TAKE 1 TABLET(80 MG) BY MOUTH DAILY AT 6 PM 30 tablet 6  . carvedilol (COREG) 25 MG tablet TAKE 1 TABLET(25 MG) BY MOUTH TWICE DAILY WITH A MEAL 180 tablet 3  . JARDIANCE 10 MG TABS tablet TAKE 1 TABLET BY MOUTH DAILY BEFORE BREAKFAST 30 tablet 11  . sacubitril-valsartan (ENTRESTO) 97-103 MG Take 1 tablet by mouth 2 (two) times daily. 60 tablet 6  . spironolactone (ALDACTONE) 25 MG tablet Take 1 tablet (25 mg total) by mouth at bedtime. 90 tablet 3   No current facility-administered medications for this encounter.    Allergies:   Patient has no known allergies.   Social History:  The patient  reports that he has quit smoking. His smoking use included cigarettes.  He has a 30.00 pack-year smoking history. He has never used smokeless tobacco. He reports that he does not drink alcohol and does not use drugs.   Family History:  The patient's family history includes Cirrhosis in his mother; Heart attack in his sister; Myocarditis in his brother.   ROS:  Please see the history of present illness.   All other systems are personally reviewed and negative.   Exam:   BP (!) 90/50   Pulse 60   Wt 101.9 kg (224 lb 9.6 oz)   SpO2 98%   BMI 32.23 kg/m   General: NAD Neck: No JVD, no thyromegaly or thyroid nodule.  Lungs: Clear to auscultation bilaterally with normal respiratory effort. CV: Nondisplaced PMI.  Heart regular S1/S2, no S3/S4, no murmur.  No peripheral edema.  No carotid bruit.  Normal pedal pulses.  Abdomen: Soft, nontender, no hepatosplenomegaly, no distention.  Skin: Intact without lesions or rashes.  Neurologic: Alert and oriented x 3.  Psych: Normal affect. Extremities: No clubbing or cyanosis.  HEENT: Normal.   Recent Labs: 12/12/2019: Magnesium 2.3 07/09/2020: BUN 38; Creatinine, Ser 1.34; Potassium 5.6; Sodium 136  Personally reviewed   Wt Readings from Last 3 Encounters:  07/09/20 101.9 kg (224 lb 9.6 oz)  12/12/19 99.6 kg (219 lb 9.6 oz)  10/24/19 99.6 kg (219 lb 9.6 oz)      ASSESSMENT AND PLAN:  1. Chronic systolic CHF: ECHO 03/30/2017 EF 25-30%. Ischemic cardiomyopathy, now s/p CABG as below. Repeat echo in 4/19 with EF still low at 30-35% and echo in 5/20 showed EF 20-25%.  Echo in 6/21 showed EF 30-35%.  He now has a Medtronic ICD.  NYHA class I-II symptoms. He is not volume overloaded on exam or by Optivol.  He has occasional orthostatic symptoms.  - Continue Coreg 12.5 mg bid.    - Continue Entresto 97/103 bid. BMET today.  - Continue spironolactone but move to qhs to decrease affect on BP during the day.  - Continue dapagliflozin 10 mg daily.   2. BPZ:WCHENI FH premature CAD.NSTEMI, now s/p CABG x 5 03/31/2017.  No chest pain.   - Continue ASA 81 and statin.  Check lipids today.  3. Atrial fibrillation: Paroxysmal. Only noted at initial admission in setting of severe CP/NSTEMI.No palpitations. No recent atrial fibrillation by device interrogation. NSR today.  - If recurs, he will need to be anticoagulated (holding off for now). 4. Smoking: Has not smoked since 1/19.  5. Carotid bruit: Moderate BICA stenosis in 10/21, repeat in 10/22.    Followup 6 months, BMET in 3 months.   Signed, Marca Ancona, MD  07/09/2020  [facility] [address] [facility] [place] [address] [phone] (office) [phone] (fax)

## 2020-07-09 NOTE — Patient Instructions (Addendum)
Labs done today. We will contact you only if your labs are abnormal.  START taking Spironolactone daily at bedtime.  No other medication changes were made. Please continue all current medications as prescribed.  Your physician recommends that you schedule a follow-up appointment in: 3 months for blood work only in [place] (paper prescription giving to you today in office) and in 6 months for an appointment with Dr. Shirlee Latch. Please contact our office in September to schedule an October appointment.   If you have any questions or concerns before your next appointment please send [place] a message through [place] or call our office at [phone].    TO LEAVE A MESSAGE FOR THE NURSE SELECT OPTION 2, PLEASE LEAVE A MESSAGE INCLUDING: . YOUR NAME . DATE OF BIRTH . CALL BACK NUMBER . REASON FOR CALL**this is important as we prioritize the call backs  YOU WILL RECEIVE A CALL BACK THE SAME DAY AS LONG AS YOU CALL BEFORE 4:00 PM   Do the following things EVERYDAY: 1) Weigh yourself in the morning before breakfast. Write it down and keep it in a log. 2) Take your medicines as prescribed 3) Eat low salt foods--Limit salt (sodium) to 2000 mg per day.  4) Stay as active as you can everyday 5) Limit all fluids for the day to less than 2 liters   At the [facility], you and your health needs are our priority. As part of our continuing mission to provide you with exceptional heart care, we have created designated Provider Care Teams. These Care Teams include your primary Cardiologist (physician) and Advanced Practice Providers (APPs- Physician Assistants and Nurse Practitioners) who all work together to provide you with the care you need, when you need it.   You may see any of the following providers on your designated Care Team at your next follow up: [name] Dr Arvilla Meres . Dr Marca Ancona . Tonye Becket, NP . Robbie Lis, PA . Karle Plumber, PharmD   Please be sure to bring  in all your medications bottles to every appointment.

## 2020-07-11 ENCOUNTER — Telehealth (HOSPITAL_COMMUNITY): Payer: Self-pay | Admitting: *Deleted

## 2020-07-11 MED ORDER — SPIRONOLACTONE 25 MG PO TABS: 12.5000 mg | ORAL_TABLET | Freq: Every day | ORAL | 3 refills | Status: DC

## 2020-07-11 NOTE — Telephone Encounter (Signed)
-----   Message from Laurey Morale, MD sent at 07/09/2020  3:11 PM EDT ----- Cut back potassium in diet, increase po hydration, decrease spironolactone to 12.5 mg daily, BMET on Friday.

## 2020-07-11 NOTE — Telephone Encounter (Signed)
Modesta Messing, [place]  07/11/2020 3:56 PM EDT Back to Top     Pt and wife aware order faxed to [facility] in [place]. Fax # [phone]   Chinita Pester, CMA  07/11/2020 3:19 PM EDT      [facility]   Laurey Morale, MD  07/09/2020 3:11 PM EDT      Cut back potassium in diet, increase po hydration, decrease spironolactone to 12.5 mg daily, BMET on Friday.

## 2020-07-15 ENCOUNTER — Other Ambulatory Visit (HOSPITAL_COMMUNITY): Payer: Self-pay | Admitting: Cardiology

## 2020-07-16 ENCOUNTER — Other Ambulatory Visit: Payer: Self-pay | Admitting: Cardiology

## 2020-07-17 LAB — BASIC METABOLIC PANEL
BUN/Creatinine Ratio: 26 — ABNORMAL HIGH (ref 10–24)
BUN: 39 mg/dL — ABNORMAL HIGH (ref 8–27)
CO2: 18 mmol/L — ABNORMAL LOW (ref 20–29)
Calcium: 8.8 mg/dL (ref 8.6–10.2)
Chloride: 105 mmol/L (ref 96–106)
Creatinine, Ser: 1.5 mg/dL — ABNORMAL HIGH (ref 0.76–1.27)
Glucose: 91 mg/dL (ref 65–99)
Potassium: 5.1 mmol/L (ref 3.5–5.2)
Sodium: 139 mmol/L (ref 134–144)
eGFR: 52 mL/min/{1.73_m2} — ABNORMAL LOW (ref >59)

## 2020-07-18 ENCOUNTER — Telehealth (HOSPITAL_COMMUNITY): Payer: Self-pay

## 2020-07-18 ENCOUNTER — Other Ambulatory Visit (HOSPITAL_COMMUNITY): Payer: Self-pay

## 2020-07-18 DIAGNOSIS — I5022 Chronic systolic (congestive) heart failure: Secondary | ICD-10-CM

## 2020-07-18 NOTE — Telephone Encounter (Signed)
-----   Message from Laurey Morale, MD sent at 07/17/2020  2:01 PM EDT ----- K is better.  Increase hydration/drink more fluid.  Repeat BMET in 10 days to make sure creatinine trends down.

## 2020-07-18 NOTE — Addendum Note (Signed)
Addended byWyline Mood, Everlina Gotts R on: 07/18/2020 12:00 PM   Modules accepted: Orders

## 2020-07-18 NOTE — Progress Notes (Signed)
Order released for [facility] 

## 2020-07-18 NOTE — Telephone Encounter (Signed)
Patients wife advised and verbalized understanding. Patient will have labs done at [facility]. Lab  order entered.   Orders Placed This Encounter  Procedures  . Basic metabolic panel    Standing Status:   Future    Standing Expiration Date:   07/18/2021    Order Specific Question:   Release to patient    Answer:   Immediate

## 2020-07-20 ENCOUNTER — Other Ambulatory Visit (HOSPITAL_COMMUNITY): Payer: Self-pay | Admitting: Cardiology

## 2020-07-31 ENCOUNTER — Ambulatory Visit (INDEPENDENT_AMBULATORY_CARE_PROVIDER_SITE_OTHER): Payer: 59

## 2020-07-31 DIAGNOSIS — I255 Ischemic cardiomyopathy: Secondary | ICD-10-CM

## 2020-07-31 DIAGNOSIS — I5022 Chronic systolic (congestive) heart failure: Secondary | ICD-10-CM

## 2020-07-31 LAB — CUP PACEART REMOTE DEVICE CHECK
Battery Remaining Longevity: 91 mo
Battery Voltage: 2.99 V
Brady Statistic RV Percent Paced: 0.03 %
Date Time Interrogation Session: 20220510153422
HighPow Impedance: 79 Ohm
Implantable Lead Implant Date: 20190502
Implantable Lead Location: 753860
Implantable Pulse Generator Implant Date: 20190502
Lead Channel Impedance Value: 1102 Ohm
Lead Channel Impedance Value: 1406 Ohm
Lead Channel Pacing Threshold Amplitude: 1.875 V
Lead Channel Pacing Threshold Pulse Width: 0.4 ms
Lead Channel Sensing Intrinsic Amplitude: 7.875 mV
Lead Channel Sensing Intrinsic Amplitude: 7.875 mV
Lead Channel Setting Pacing Amplitude: 4.5 V
Lead Channel Setting Pacing Pulse Width: 0.4 ms
Lead Channel Setting Sensing Sensitivity: 0.3 mV

## 2020-07-31 LAB — BASIC METABOLIC PANEL
BUN/Creatinine Ratio: 20 (ref 10–24)
BUN: 31 mg/dL — ABNORMAL HIGH (ref 8–27)
CO2: 21 mmol/L (ref 20–29)
Calcium: 8.9 mg/dL (ref 8.6–10.2)
Chloride: 106 mmol/L (ref 96–106)
Creatinine, Ser: 1.55 mg/dL — ABNORMAL HIGH (ref 0.76–1.27)
Glucose: 101 mg/dL — ABNORMAL HIGH (ref 65–99)
Potassium: 5.4 mmol/L — ABNORMAL HIGH (ref 3.5–5.2)
Sodium: 142 mmol/L (ref 134–144)
eGFR: 50 mL/min/{1.73_m2} — ABNORMAL LOW (ref >59)

## 2020-08-02 ENCOUNTER — Telehealth (HOSPITAL_COMMUNITY): Payer: Self-pay | Admitting: *Deleted

## 2020-08-02 MED ORDER — VELTASSA 8.4 G PO PACK: 8.4000 g | PACK | Freq: Every day | ORAL | 3 refills | Status: DC

## 2020-08-02 NOTE — Telephone Encounter (Signed)
-----   Message from Laurey Morale, MD sent at 08/01/2020 10:06 PM EDT ----- I would like to keep him on spironolactone but K runs high. Would start Veltassa 8.4 g daily in addition to spironolactone to keep K down.

## 2020-08-02 NOTE — Telephone Encounter (Signed)
Modesta Messing, [place]  08/02/2020 4:01 PM EDT Back to Top     Pts wife aware and agreeable with plan.    Laurey Morale, MD  08/01/2020 10:06 PM EDT      I would like to keep him on spironolactone but K runs high. Would start Veltassa 8.4 g daily in addition to spironolactone to keep K down.    Marisa Hua, RN  08/01/2020 10:25 AM EDT      Pts wife states Cleda Daub was cut in half last month. Pt has been taken 12.5mg  since April 20th. Please advise   Laurey Morale, MD  07/31/2020 9:26 AM EDT      K still high, decrease spironolactone to 12.5 mg daily with BMET 10 days.

## 2020-08-06 ENCOUNTER — Other Ambulatory Visit (HOSPITAL_COMMUNITY): Payer: Self-pay

## 2020-08-06 ENCOUNTER — Telehealth (HOSPITAL_COMMUNITY): Payer: Self-pay | Admitting: Pharmacy Technician

## 2020-08-06 MED ORDER — ATORVASTATIN CALCIUM 80 MG PO TABS: ORAL_TABLET | ORAL | 6 refills | Status: DC

## 2020-08-06 NOTE — Telephone Encounter (Signed)
Patient Advocate Encounter   Received notification from [employer] that prior authorization for Lelon Perla is required.   PA submitted on CoverMyMeds Key [id number] Status is pending   Will continue to follow.

## 2020-08-10 NOTE — Telephone Encounter (Signed)
[facility] Patient Advocate Encounter  Prior Authorization for Lelon Perla has been approved.    PA#  [id number] Effective dates: 08/06/20 through 08/06/21  Archer Asa, CPhT

## 2020-08-22 NOTE — Progress Notes (Signed)
Remote ICD transmission.   

## 2020-09-03 ENCOUNTER — Other Ambulatory Visit (HOSPITAL_COMMUNITY): Payer: Self-pay | Admitting: Cardiology

## 2020-10-05 ENCOUNTER — Other Ambulatory Visit (HOSPITAL_COMMUNITY): Payer: Self-pay | Admitting: *Deleted

## 2020-10-05 MED ORDER — VELTASSA 8.4 G PO PACK: 8.4000 g | PACK | Freq: Every day | ORAL | 3 refills | Status: DC

## 2020-10-30 ENCOUNTER — Ambulatory Visit (INDEPENDENT_AMBULATORY_CARE_PROVIDER_SITE_OTHER): Payer: 59

## 2020-10-30 DIAGNOSIS — I255 Ischemic cardiomyopathy: Secondary | ICD-10-CM

## 2020-10-31 LAB — CUP PACEART REMOTE DEVICE CHECK
Battery Remaining Longevity: 87 mo
Battery Voltage: 2.99 V
Brady Statistic RV Percent Paced: 0.02 %
Date Time Interrogation Session: 20220809153528
HighPow Impedance: 83 Ohm
Implantable Lead Implant Date: 20190502
Implantable Lead Location: 753860
Implantable Pulse Generator Implant Date: 20190502
Lead Channel Impedance Value: 1159 Ohm
Lead Channel Impedance Value: 1425 Ohm
Lead Channel Pacing Threshold Amplitude: 1.5 V
Lead Channel Pacing Threshold Pulse Width: 0.4 ms
Lead Channel Sensing Intrinsic Amplitude: 7 mV
Lead Channel Sensing Intrinsic Amplitude: 7 mV
Lead Channel Setting Pacing Amplitude: 3.75 V
Lead Channel Setting Pacing Pulse Width: 0.4 ms
Lead Channel Setting Sensing Sensitivity: 0.3 mV

## 2020-11-22 NOTE — Progress Notes (Signed)
Remote ICD transmission.   

## 2021-01-02 ENCOUNTER — Other Ambulatory Visit: Payer: Self-pay

## 2021-01-02 ENCOUNTER — Ambulatory Visit (HOSPITAL_COMMUNITY)
Admission: RE | Admit: 2021-01-02 | Discharge: 2021-01-02 | Disposition: A | Discharge status: Home / Self Care | Payer: 59 | Source: Ambulatory Visit | Attending: Cardiology | Admitting: Cardiology

## 2021-01-02 DIAGNOSIS — I6523 Occlusion and stenosis of bilateral carotid arteries: Secondary | ICD-10-CM | POA: Diagnosis not present

## 2021-01-06 NOTE — Progress Notes (Signed)
Electrophysiology Office Note   Date:  01/07/2021   ID:  Erie Radu, DOB 1955-08-26, MRN [id number]  PCP:  Shelle Iron, MD  Cardiologist:  Shirlee Latch Primary Electrophysiologist:  Regan Lemming, MD    No chief complaint on file.    History of Present Illness: Ryan Durham is a 65 y.o. male who is being seen today for the evaluation of CHF at the request of Marca Ancona. Presenting today for electrophysiology evaluation.    He has a history of severe coronary artery disease status post CABG January 2019, systolic heart failure due to ischemic cardiomyopathy, atrial fibrillation in the setting of STEMI, and tobacco abuse.  January 2019 he had a sudden onset of chest tightness and shortness of breath.  He was noted to be in rapid atrial fibrillation.  Left heart catheterization showed three-vessel disease.  He is status post 5 vessel CABG.  Echo showed an ejection fraction of 30 to 35%.  He is now status post Medtronic ICD implanted 07/23/2017.  Today, denies symptoms of palpitations, chest pain, shortness of breath, orthopnea, PND, lower extremity edema, claudication, dizziness, presyncope, syncope, bleeding, or neurologic sequela. The patient is tolerating medications without difficulties.  Since being seen he has done well.  He has no chest pain or shortness of breath.  He is able to all of his daily activities without restriction.  He has some dizziness, but this has been addressed by his heart failure cardiologist.  Otherwise, he has no complaints.   Past Medical History:  Diagnosis Date   Acute on chronic systolic heart failure (HCC) 03/29/2017   Anginal pain (HCC)    this admission   Cardiomyopathy, ischemic 03/30/2017   CHF (congestive heart failure) (HCC)    Coronary artery disease    this admission   Coronary artery disease involving native coronary artery of native heart with unstable angina pectoris (HCC) 03/29/2017   Dyspnea    this admission   Dysrhythmia    afib  with this admission/runs of VTACH/Afib both with this admission   Hyperlipidemia    Myocardial infarction [facility])    this admission   Paroxysmal atrial fibrillation (HCC) 03/29/2017   Tobacco abuse    Type II diabetes mellitus (HCC) 03/30/2017   Past Surgical History:  Procedure Laterality Date   CLIPPING OF ATRIAL APPENDAGE Left 03/31/2017   Procedure: CLIPPING OF ATRIAL APPENDAGE;  Surgeon: Purcell Nails, MD;  Location: [facility] OR;  Service: Open Heart Surgery;  Laterality: Left;   CORONARY ANGIOPLASTY  03/30/2017   CORONARY ARTERY BYPASS GRAFT N/A 03/31/2017   Procedure: CORONARY ARTERY BYPASS GRAFTING (CABG) time 5 using bilateral saphaneous vien, harvested endoscopicly and right internal mammary artery.;  Surgeon: Purcell Nails, MD;  Location: [facility] OR;  Service: Open Heart Surgery;  Laterality: N/A;   CORONARY/GRAFT ACUTE MI REVASCULARIZATION N/A 03/29/2017   Procedure: Coronary/Graft Acute MI Revascularization;  Surgeon: Kathleene Hazel, MD;  Location: [facility] INVASIVE CV LAB;  Service: Cardiovascular;  Laterality: N/A;   IABP INSERTION N/A 03/29/2017   Procedure: IABP Insertion;  Surgeon: Kathleene Hazel, MD;  Location: [facility] INVAS[facility] CV LAB;  Service: Cardiovascular;  Laterality: N/A;   ICD IMPLANT N/A 07/23/2017   Procedure: ICD IMPLANT;  Surgeon: Regan Lemming, MD;  Location: [facility] INVASIVE CV LAB;  Service: Cardiovascular;  Laterality: N/A;   LEFT HEART CATH AND CORONARY ANGIOGRAPHY N/A 03/29/2017   Procedure: LEFT HEART CATH AND CORONARY ANGIOGRAPHY;  Surgeon: Kathleene Hazel, MD;  Location: [facility] INVASIVE CV LAB;  Service: Cardiovascular;  Laterality: N/A;   TEE WITHOUT CARDIOVERSION N/A 03/31/2017   Procedure: TRANSESOPHAGEAL ECHOCARDIOGRAM (TEE);  Surgeon: Purcell Nails, MD;  Location: [facility] OR;  Service: Open Heart Surgery;  Laterality: N/A;     Current Outpatient Medications  Medication Sig Dispense Refill   aspirin EC 81 MG EC tablet Take 1 tablet (81 mg total) by mouth  daily.     atorvastatin (LIPITOR) 80 MG tablet Take one tablet daily 30 tablet 6   carvedilol (COREG) 25 MG tablet TAKE 1 TABLET(25 MG) BY MOUTH TWICE DAILY WITH A MEAL 180 tablet 3   ENTRESTO 97-103 MG TAKE 1 TABLET BY MOUTH TWICE DAILY 60 tablet 6   JARDIANCE 10 MG TABS tablet TAKE 1 TABLET BY MOUTH DAILY BEFORE BREAKFAST 30 tablet 11   patiromer (VELTASSA) 8.4 g packet Take 1 packet (8.4 g total) by mouth daily. 30 each 3   spironolactone (ALDACTONE) 25 MG tablet TAKE 1 TABLET(25 MG) BY MOUTH DAILY (Patient taking differently: 12.5 mg.) 90 tablet 0   No current facility-administered medications for this visit.    Allergies:   Patient has no known allergies.   Social History:  The patient  reports that he has quit smoking. His smoking use included cigarettes. He has a 30.00 pack-year smoking history. He has never used smokeless tobacco. He reports that he does not drink alcohol and does not use drugs.   Family History:  The patient's family history includes Cirrhosis in his mother; Heart attack in his sister; Myocarditis in his brother.   ROS:  Please see the history of present illness.   Otherwise, review of systems is positive for none.   All other systems are reviewed and negative.   PHYSICAL EXAM: VS:  BP (!) 142/77   Pulse 67   Ht 5\' 10"  (1.778 m)   Wt 237 lb (107.5 kg)   SpO2 96%   BMI 34.01 kg/m  , BMI Body mass index is 34.01 kg/m. GEN: Well nourished, well developed, in no acute distress  HEENT: normal  Neck: no JVD, carotid bruits, or masses Cardiac: RRR; no murmurs, rubs, or gallops,no edema  Respiratory:  clear to auscultation bilaterally, normal work of breathing GI: soft, nontender, nondistended, + BS MS: no deformity or atrophy  Skin: warm and dry, device site well healed Neuro:  Strength and sensation are intact Psych: euthymic mood, full affect  EKG:  EKG is ordered today. Personal review of the ekg ordered shows sinus rhythm, rate 67  Personal review of  the device interrogation today. Results in [facility]   Recent Labs: 07/30/2020: BUN 31; Creatinine, Ser 1.55; Potassium 5.4; Sodium 142    Lipid Panel     Component Value Date/Time   CHOL 131 07/09/2020 1040   TRIG 135 07/09/2020 1040   HDL 39 (L) 07/09/2020 1040   CHOLHDL 3.4 07/09/2020 1040   VLDL 27 07/09/2020 1040   LDLCALC 65 07/09/2020 1040     Wt Readings from Last 3 Encounters:  01/07/21 237 lb (107.5 kg)  07/09/20 224 lb 9.6 oz (101.9 kg)  12/12/19 219 lb 9.6 oz (99.6 kg)      Other studies Reviewed: Additional studies/ records that were reviewed today include: TTE 09/15/2019  Review of the above records today demonstrates:   1. Left ventricular ejection fraction, by estimation, is 30 to 35%. The  left ventricle has moderately decreased function. The left ventricle  demonstrates global hypokinesis. Left ventricular diastolic parameters are  consistent with Grade II  diastolic  dysfunction (pseudonormalization).   2. Right ventricular systolic function is normal. The right ventricular  size is normal. Tricuspid regurgitation signal is inadequate for assessing  PA pressure.   3. The mitral valve is normal in structure. No evidence of mitral valve  regurgitation. No evidence of mitral stenosis.   4. The aortic valve is tricuspid. Aortic valve regurgitation is not  visualized. Mild to moderate aortic valve sclerosis/calcification is  present, without any evidence of aortic stenosis.   5. The inferior vena cava is normal in size with greater than 50%  respiratory variability, suggesting right atrial pressure of 3 mmHg.  LHC 03/30/17 Prox RCA lesion is 80% stenosed. Prox RCA to Dist RCA lesion is 30% stenosed. Ost RPDA lesion is 99% stenosed. Post Atrio lesion is 80% stenosed. Dist LM to Ost LAD lesion is 100% stenosed. Ost 1st Mrg lesion is 100% stenosed. Ost Cx to Prox Cx lesion is 99% stenosed. Ost 2nd Mrg to 2nd Mrg lesion is 70% stenosed.   ASSESSMENT AND  PLAN:  1.  Chronic systolic heart failure due to ischemic cardiomyopathy: Currently on optimal medical therapy.  Status post Medtronic ICD implanted 07/23/2017.  His impedance is elevated and R waves are low, though device has been functioning appropriately.  No changes at this time.  2.  Coronary artery disease status post CABG: No current chest pain.  Continue aspirin, Plavix, statin.  No current chest pain.  3.  Paroxysmal atrial fibrillation: Occurred in the setting of his STEMI.  No further episodes on device interrogation.  If he has further episodes, Modene Andy need anticoagulation.  4.  Tobacco abuse: Complete cessation encouraged.  Current medicines are reviewed at length with the patient today.   The patient does not have concerns regarding his medicines.  The following changes were made today: None  Labs/ tests ordered today include:  Orders Placed This Encounter  Procedures   EKG 12-Lead     Disposition:   FU with Charleen Madera 12 months  Signed, Elva Breaker Jorja Loa, MD  01/07/2021 11:17 AM     [facility] [address] [place] [address] [phone] (office) [phone] (fax)

## 2021-01-07 ENCOUNTER — Encounter: Payer: Self-pay | Admitting: Cardiology

## 2021-01-07 ENCOUNTER — Ambulatory Visit (INDEPENDENT_AMBULATORY_CARE_PROVIDER_SITE_OTHER): Payer: 59 | Admitting: Cardiology

## 2021-01-07 ENCOUNTER — Other Ambulatory Visit: Payer: Self-pay

## 2021-01-07 VITALS — BP 142/77 | HR 67 | Ht 70.0 in | Wt 237.0 lb

## 2021-01-07 DIAGNOSIS — I255 Ischemic cardiomyopathy: Secondary | ICD-10-CM | POA: Diagnosis not present

## 2021-01-07 NOTE — Patient Instructions (Signed)
Medication Instructions:  Your physician recommends that you continue on your current medications as directed. Please refer to the Current Medication list given to you today.  *If you need a refill on your cardiac medications before your next appointment, please call your pharmacy*   Lab Work: None ordered   Testing/Procedures: None ordered   Follow-Up: At [facility], you and your health needs are our priority.  As part of our continuing mission to provide you with exceptional heart care, we have created designated Provider Care Teams.  These Care Teams include your primary Cardiologist (physician) and Advanced Practice Providers (APPs -  Physician Assistants and Nurse Practitioners) who all work together to provide you with the care you need, when you need it.  Remote monitoring is used to monitor your Pacemaker or ICD from home. This monitoring reduces the number of office visits required to check your device to one time per year. It allows [place] to keep an eye on the functioning of your device to ensure it is working properly. You are scheduled for a device check from home on 01/29/2021. You may send your transmission at any time that day. If you have a wireless device, the transmission Didi Ganaway be sent automatically. After your physician reviews your transmission, you Sharie Amorin receive a postcard with your next transmission date.  Your next appointment:   12 month(s)  The format for your next appointment:   In Person  Provider:   Loman Brooklyn, MD   Thank you for choosing [facility]!!   Dory Horn, RN [phone]

## 2021-01-28 ENCOUNTER — Other Ambulatory Visit: Payer: Self-pay | Admitting: Cardiology

## 2021-01-29 ENCOUNTER — Ambulatory Visit (INDEPENDENT_AMBULATORY_CARE_PROVIDER_SITE_OTHER): Payer: 59

## 2021-01-29 DIAGNOSIS — I255 Ischemic cardiomyopathy: Secondary | ICD-10-CM | POA: Diagnosis not present

## 2021-01-29 DIAGNOSIS — I5022 Chronic systolic (congestive) heart failure: Secondary | ICD-10-CM

## 2021-01-29 LAB — BASIC METABOLIC PANEL
BUN/Creatinine Ratio: 27 — ABNORMAL HIGH (ref 10–24)
BUN: 36 mg/dL — ABNORMAL HIGH (ref 8–27)
CO2: 19 mmol/L — ABNORMAL LOW (ref 20–29)
Calcium: 9.3 mg/dL (ref 8.6–10.2)
Chloride: 102 mmol/L (ref 96–106)
Creatinine, Ser: 1.32 mg/dL — ABNORMAL HIGH (ref 0.76–1.27)
Glucose: 120 mg/dL — ABNORMAL HIGH (ref 70–99)
Potassium: 4.8 mmol/L (ref 3.5–5.2)
Sodium: 136 mmol/L (ref 134–144)
eGFR: 61 mL/min/{1.73_m2} (ref >59)

## 2021-01-30 LAB — CUP PACEART REMOTE DEVICE CHECK
Battery Remaining Longevity: 81 mo
Battery Voltage: 2.99 V
Brady Statistic RV Percent Paced: 0.02 %
Date Time Interrogation Session: 20221108163726
HighPow Impedance: 84 Ohm
Implantable Lead Implant Date: 20190502
Implantable Lead Location: 753860
Implantable Pulse Generator Implant Date: 20190502
Lead Channel Impedance Value: 1216 Ohm
Lead Channel Impedance Value: 1482 Ohm
Lead Channel Pacing Threshold Amplitude: 1.375 V
Lead Channel Pacing Threshold Pulse Width: 0.4 ms
Lead Channel Sensing Intrinsic Amplitude: 10.75 mV
Lead Channel Sensing Intrinsic Amplitude: 10.75 mV
Lead Channel Setting Pacing Amplitude: 4.75 V
Lead Channel Setting Pacing Pulse Width: 0.4 ms
Lead Channel Setting Sensing Sensitivity: 0.3 mV

## 2021-02-04 ENCOUNTER — Other Ambulatory Visit: Payer: Self-pay

## 2021-02-04 ENCOUNTER — Encounter (HOSPITAL_COMMUNITY): Payer: Self-pay | Admitting: Cardiology

## 2021-02-04 ENCOUNTER — Ambulatory Visit (HOSPITAL_COMMUNITY)
Admission: RE | Admit: 2021-02-04 | Discharge: 2021-02-04 | Disposition: A | Discharge status: Home / Self Care | Payer: 59 | Source: Ambulatory Visit | Attending: Cardiology | Admitting: Cardiology

## 2021-02-04 VITALS — BP 114/70 | HR 66 | Wt 231.2 lb

## 2021-02-04 DIAGNOSIS — I2510 Atherosclerotic heart disease of native coronary artery without angina pectoris: Secondary | ICD-10-CM | POA: Insufficient documentation

## 2021-02-04 DIAGNOSIS — I509 Heart failure, unspecified: Secondary | ICD-10-CM

## 2021-02-04 DIAGNOSIS — Z8249 Family history of ischemic heart disease and other diseases of the circulatory system: Secondary | ICD-10-CM | POA: Insufficient documentation

## 2021-02-04 DIAGNOSIS — Z951 Presence of aortocoronary bypass graft: Secondary | ICD-10-CM | POA: Insufficient documentation

## 2021-02-04 DIAGNOSIS — Z79899 Other long term (current) drug therapy: Secondary | ICD-10-CM | POA: Insufficient documentation

## 2021-02-04 DIAGNOSIS — Z95810 Presence of automatic (implantable) cardiac defibrillator: Secondary | ICD-10-CM | POA: Insufficient documentation

## 2021-02-04 DIAGNOSIS — N529 Male erectile dysfunction, unspecified: Secondary | ICD-10-CM | POA: Diagnosis not present

## 2021-02-04 DIAGNOSIS — I6523 Occlusion and stenosis of bilateral carotid arteries: Secondary | ICD-10-CM | POA: Insufficient documentation

## 2021-02-04 DIAGNOSIS — R0989 Other specified symptoms and signs involving the circulatory and respiratory systems: Secondary | ICD-10-CM | POA: Diagnosis not present

## 2021-02-04 DIAGNOSIS — I252 Old myocardial infarction: Secondary | ICD-10-CM | POA: Insufficient documentation

## 2021-02-04 DIAGNOSIS — Z87891 Personal history of nicotine dependence: Secondary | ICD-10-CM | POA: Diagnosis not present

## 2021-02-04 DIAGNOSIS — I5022 Chronic systolic (congestive) heart failure: Secondary | ICD-10-CM | POA: Diagnosis not present

## 2021-02-04 DIAGNOSIS — I480 Paroxysmal atrial fibrillation: Secondary | ICD-10-CM | POA: Diagnosis not present

## 2021-02-04 LAB — BASIC METABOLIC PANEL
Anion gap: 10 (ref 5–15)
BUN: 24 mg/dL — ABNORMAL HIGH (ref 8–23)
CO2: 23 mmol/L (ref 22–32)
Calcium: 9.2 mg/dL (ref 8.9–10.3)
Chloride: 107 mmol/L (ref 98–111)
Creatinine, Ser: 1.23 mg/dL (ref 0.61–1.24)
GFR, Estimated: >60 mL/min (ref >60)
Glucose, Bld: 152 mg/dL — ABNORMAL HIGH (ref 70–99)
Potassium: 5.6 mmol/L — ABNORMAL HIGH (ref 3.5–5.1)
Sodium: 140 mmol/L (ref 135–145)

## 2021-02-04 MED ORDER — SILDENAFIL CITRATE 20 MG PO TABS: 50.0000 mg | ORAL_TABLET | ORAL | Status: DC | PRN

## 2021-02-04 NOTE — Patient Instructions (Signed)
Take Viagra as prescribed.  Labs done today.  Your physician recommends that you schedule a follow-up appointment in: 4 months with echocardiogram  If you have any questions or concerns before your next appointment please send [place] a message through [place] or call our office at [phone].    TO LEAVE A MESSAGE FOR THE NURSE SELECT OPTION 2, PLEASE LEAVE A MESSAGE INCLUDING: YOUR NAME DATE OF BIRTH CALL BACK NUMBER REASON FOR CALL**this is important as we prioritize the call backs  YOU WILL RECEIVE A CALL BACK THE SAME DAY AS LONG AS YOU CALL BEFORE 4:00 PM  At the [facility], you and your health needs are our priority. As part of our continuing mission to provide you with exceptional heart care, we have created designated Provider Care Teams. These Care Teams include your primary Cardiologist (physician) and Advanced Practice Providers (APPs- Physician Assistants and Nurse Practitioners) who all work together to provide you with the care you need, when you need it.   You may see any of the following providers on your designated Care Team at your next follow up: Dr Arvilla Meres Dr Carron Curie, NP Robbie Lis, [place] [facility], [place] Karle Plumber, PharmD   Please be sure to bring in all your medications bottles to every appointment.

## 2021-02-05 ENCOUNTER — Telehealth (HOSPITAL_COMMUNITY): Payer: Self-pay | Admitting: *Deleted

## 2021-02-05 MED ORDER — VELTASSA 16.8 G PO PACK: 16.8000 g | PACK | Freq: Every day | ORAL | 6 refills | Status: DC

## 2021-02-05 NOTE — Addendum Note (Signed)
Encounter addended by: Laurey Morale, MD on: 02/05/2021 8:56 PM  Actions taken: Clinical Note Signed

## 2021-02-05 NOTE — Telephone Encounter (Signed)
-----   Message from Laurey Morale, MD sent at 02/04/2021  5:48 PM EST ----- Hold spironolactone for a day and increase Veltassa to 16.8 g daily.

## 2021-02-05 NOTE — Telephone Encounter (Signed)
Spoke w/pt and wife, they are aware, agreeable, and verbalized understanding

## 2021-02-05 NOTE — Progress Notes (Addendum)
Date:  02/05/2021   ID:  [name], DOB 1955-05-22, MRN [id number]   Provider location: [facility] Type of Visit: Established patient  PCP:  Charlotte Sanes, MD  Cardiologist:  Dr. Aundra Dubin   History of Present Illness: Ryan Durham is a 65 y.o. male who has a history of CAD s/p CABG x 5 [id number], systolic CHF 2/2 ICM, Afib in setting of STEMI, and tobacco abuse.    Admitted 1/6 -> 04/09/17 with NSTEMI with sudden onset of chest tightness and SOB. Noted to be in rapid Afib on arrival. He spontaneously converted to NSR with treatment of his chest pain. Underwent LHC that showed critical 3vD. IABP placed with cardiogenic shock and TCTS consulted for CABG. Pt underwent CABG 03/31/2017 with no immediate complications. Pressors weaned and extubated as tolerated. Weaned off IABP POD #1. Pt diuresed with IV lasix and HF medications adjusted as tolerated.  Discharge weight 217 lbs.    Medtronic ICD placed in 5/19. Echo in 5/20 with EF 20-25%.  Echo in 6/21 with EF 30-35%, global HK, normal RV size and systolic function.    He returns for followup of CHF.  No lightheadedness.  No significant exertional dyspnea or chest pain.  No problems walking up stairs.  Weight is up.   Medtronic device interrogation: Stable thoracic impedance, no VT/AF.   ECG: NSR, LAFB (personally reviewed).   Labs (3/19): LDL 56, HDL 26, K 4.3, creatinine 1.02 Labs (5/19): K 5, creatinine 0.94  Labs (8/19): K 4.7, creatinine 1.05 Labs (11/19): K 4.1, creatinine 0.9 Labs (2/20): LDL 59, HDl 44, K 4.2, ceatinine 1.09 Labs (4/20): hgb 15.3, K 5, creatinine 1.09, LDL 61 Labs (8/20): K 4.9, creatinine 1.27 Labs (11/20): LDL 71, HDL 43 Labs (12/20): K 4.6, creatinine 1.18 Labs (6/21): K 4.5, creatinine 1.05 Labs (7/21): K 5, creatinine 1.31 Labs (10/21): K 4.8, creatinine 1.22 Labs (11/22): K 4.8, creatinine 1.32, LDL 65   Past Medical History 1. Chronic systolic CHF: ECHO [id number] EF 25-30%.  Ischemic cardiomyopathy, now s/p CABG.  - Echo (4/19): EF 30-35%, wall motion abnormalities, grade 2 diastolic dysfunction, normal RV size and systolic function.   - Medtronic ICD.  - Echo (5/20): EF 20-25%, normal RV size and systolic function.  - Echo (6/21): EF 30-35%, global HK, normal RV size and systolic function.  2. CAD: Strong FH premature CAD. NSTEMI 1/19, now s/p CABG 03/2017 3. Atrial fibrillation: Paroxysmal. Noted only in setting of NSTEMI.  4. Smoking: Has quit.  5. Carotid stenosis: Carotid dopplers (11/19) with 40-59% BICA stenosis.  - Carotid dopplers (11/20): 40-59% RICA - Carotid dopplers (10/21): 40-59% BICA - Carotid dopplers (10/22): 40-59% BICA  Current Outpatient Medications  Medication Sig Dispense Refill   aspirin EC 81 MG EC tablet Take 1 tablet (81 mg total) by mouth daily.     atorvastatin (LIPITOR) 80 MG tablet Take one tablet daily 30 tablet 6   carvedilol (COREG) 25 MG tablet TAKE 1 TABLET(25 MG) BY MOUTH TWICE DAILY WITH A MEAL 180 tablet 3   ENTRESTO 97-103 MG TAKE 1 TABLET BY MOUTH TWICE DAILY 60 tablet 6   JARDIANCE 10 MG TABS tablet TAKE 1 TABLET BY MOUTH DAILY BEFORE BREAKFAST 30 tablet 11   spironolactone (ALDACTONE) 25 MG tablet Take 12.5 mg by mouth daily.     Patiromer Sorbitex Calcium (VELTASSA) 16.8 g PACK Take 16.8 g by mouth daily. 30 each 6   No current facility-administered medications for this encounter.  Allergies:   Patient has no known allergies.   Social History:  The patient  reports that he has quit smoking. His smoking use included cigarettes. He has a 30.00 pack-year smoking history. He has never used smokeless tobacco. He reports that he does not drink alcohol and does not use drugs.   Family History:  The patient's family history includes Cirrhosis in his mother; Heart attack in his sister; Myocarditis in his brother.   ROS:  Please see the history of present illness.   All other systems are personally reviewed and negative.    Exam:   BP 114/70   Pulse 66   Wt 104.9 kg (231 lb 3.2 oz)   SpO2 97%   BMI 33.17 kg/m  General: NAD Neck: No JVD, no thyromegaly or thyroid nodule.  Lungs: Clear to auscultation bilaterally with normal respiratory effort. CV: Nondisplaced PMI.  Heart regular S1/S2, no S3/S4, no murmur.  No peripheral edema.  No carotid bruit.  Normal pedal pulses.  Abdomen: Soft, nontender, no hepatosplenomegaly, no distention.  Skin: Intact without lesions or rashes.  Neurologic: Alert and oriented x 3.  Psych: Normal affect. Extremities: No clubbing or cyanosis.  HEENT: Normal.   Recent Labs: 02/04/2021: BUN 24; Creatinine, Ser 1.23; Potassium 5.6; Sodium 140  Personally reviewed   Wt Readings from Last 3 Encounters:  02/04/21 104.9 kg (231 lb 3.2 oz)  01/07/21 107.5 kg (237 lb)  07/09/20 101.9 kg (224 lb 9.6 oz)      ASSESSMENT AND PLAN:  1. Chronic systolic CHF: ECHO 03/30/2017 EF 25-30%. Ischemic cardiomyopathy, now s/p CABG as below. Repeat echo in 4/19 with EF still low at 30-35% and echo in 5/20 showed EF 20-25%.  Echo in 6/21 showed EF 30-35%.  He now has a Medtronic ICD.  NYHA class I-II symptoms. He is not volume overloaded on exam or by Optivol.  - Continue Coreg 25 mg bid.    - Continue Entresto 97/103 bid. BMET today.  - Continue spironolactone 25 mg daily. He is on Veltassa to allow him to continue spironolactone.  - Continue dapagliflozin 10 mg daily.   - I will arrange for repeat echo at followup in 4 months.  2. CAD: Strong FH premature CAD.  NSTEMI, now s/p CABG x 5 03/31/2017.  No chest pain.   - Continue ASA 81 and statin.  Good lipids in 11/22.  3. Atrial fibrillation: Paroxysmal. Only noted at initial admission in setting of severe CP/NSTEMI.  No palpitations. No recent atrial fibrillation by device interrogation. NSR today.  - If recurs, he will need to be anticoagulated (holding off for now). 4. Smoking: Has not smoked since 1/19.  5. Carotid bruit: Moderate BICA  stenosis in 10/22, repeat in 10/23.   6. Erectile dysfunction: I think it would be ok for him to use Viagra, I gave him a prescription and warned about risks of combining with NTG.   Followup in 4 months with echo  Signed, Marca Ancona, MD  02/05/2021  [facility] [address] [facility] [place] [address] [phone] (office) [phone] (fax)

## 2021-02-06 NOTE — Progress Notes (Signed)
Remote ICD transmission.   

## 2021-02-10 ENCOUNTER — Other Ambulatory Visit (HOSPITAL_COMMUNITY): Payer: Self-pay | Admitting: Cardiology

## 2021-02-13 ENCOUNTER — Other Ambulatory Visit (HOSPITAL_COMMUNITY): Payer: Self-pay | Admitting: Cardiology

## 2021-02-19 ENCOUNTER — Other Ambulatory Visit (HOSPITAL_COMMUNITY): Payer: Self-pay | Admitting: Cardiology

## 2021-02-19 DIAGNOSIS — I6523 Occlusion and stenosis of bilateral carotid arteries: Secondary | ICD-10-CM

## 2021-03-12 ENCOUNTER — Other Ambulatory Visit (HOSPITAL_COMMUNITY): Payer: Self-pay

## 2021-03-12 MED ORDER — ATORVASTATIN CALCIUM 80 MG PO TABS: ORAL_TABLET | ORAL | 6 refills | Status: DC

## 2021-03-12 MED ORDER — EMPAGLIFLOZIN 10 MG PO TABS: 10.0000 mg | ORAL_TABLET | Freq: Every day | ORAL | 6 refills | Status: DC

## 2021-04-30 ENCOUNTER — Ambulatory Visit (INDEPENDENT_AMBULATORY_CARE_PROVIDER_SITE_OTHER): Payer: 59

## 2021-04-30 ENCOUNTER — Telehealth: Payer: Self-pay

## 2021-04-30 DIAGNOSIS — I255 Ischemic cardiomyopathy: Secondary | ICD-10-CM

## 2021-04-30 LAB — CUP PACEART REMOTE DEVICE CHECK
Battery Remaining Longevity: 77 mo
Battery Voltage: 2.99 V
Brady Statistic RV Percent Paced: 0.02 %
Date Time Interrogation Session: 20230207001603
HighPow Impedance: 78 Ohm
Implantable Lead Implant Date: 20190502
Implantable Lead Location: 753860
Implantable Pulse Generator Implant Date: 20190502
Lead Channel Impedance Value: 1273 Ohm
Lead Channel Impedance Value: 1596 Ohm
Lead Channel Pacing Threshold Amplitude: 1.625 V
Lead Channel Pacing Threshold Pulse Width: 0.4 ms
Lead Channel Sensing Intrinsic Amplitude: 9.75 mV
Lead Channel Sensing Intrinsic Amplitude: 9.75 mV
Lead Channel Setting Pacing Amplitude: 5 V
Lead Channel Setting Pacing Pulse Width: 1 ms
Lead Channel Setting Sensing Sensitivity: 0.3 mV

## 2021-04-30 NOTE — Telephone Encounter (Signed)
Carelink scheduled report received- RV capture mgmt to HIGH (5.00v/1.40ms) since approx Dec/2022. Mild gradual incline in RV impedance (all). RV pacing >0.1%. No short V-V intervals.   Patient is not device dependant.    Attempted to reach Ryan Durham to have send follow-up transmission and/ or schedule for inclinic testing.  No answer.  LVM with device clinic # and hours to return phone call.    Before notes were filed, Ryan Durham and daughter called back. Reviewed data with them.  Advised in-clinic appt is needed to test in person and determine next step. Due to Dr. Elberta Fortis being out of office and earliest APP visit available will send a message to Dr.Lambert to determine if more urgent visit is needed.

## 2021-05-02 NOTE — Telephone Encounter (Signed)
Attempted to reach pt to schedule for in-clinic check in Device clinic at a time when Dr. Lalla Brothers is in the office.  He is here on Tuesday all day or Wednesday after noon next week.    No answer, left VM requesting callback.

## 2021-05-02 NOTE — Telephone Encounter (Signed)
Spoke with patient and his wife;  Scheduled for OV on 05/07/21 when Dr. Lalla Brothers is in office.

## 2021-05-02 NOTE — Telephone Encounter (Signed)
Kitty returned nurse call. I told her the nurse will give them a call back.

## 2021-05-03 NOTE — Progress Notes (Signed)
Remote ICD transmission.   

## 2021-05-07 ENCOUNTER — Other Ambulatory Visit (HOSPITAL_COMMUNITY): Payer: Self-pay

## 2021-05-07 ENCOUNTER — Ambulatory Visit (INDEPENDENT_AMBULATORY_CARE_PROVIDER_SITE_OTHER): Payer: 59

## 2021-05-07 ENCOUNTER — Other Ambulatory Visit: Payer: Self-pay

## 2021-05-07 DIAGNOSIS — I255 Ischemic cardiomyopathy: Secondary | ICD-10-CM | POA: Diagnosis not present

## 2021-05-07 LAB — CUP PACEART INCLINIC DEVICE CHECK
Battery Remaining Longevity: 77 mo
Battery Voltage: 2.98 V
Brady Statistic RV Percent Paced: 0.02 %
Date Time Interrogation Session: 20230214160516
HighPow Impedance: 81 Ohm
Implantable Lead Implant Date: 20190502
Implantable Lead Location: 753860
Implantable Pulse Generator Implant Date: 20190502
Lead Channel Impedance Value: 1273 Ohm
Lead Channel Impedance Value: 1596 Ohm
Lead Channel Pacing Threshold Amplitude: 1.625 V
Lead Channel Pacing Threshold Pulse Width: 0.4 ms
Lead Channel Sensing Intrinsic Amplitude: 8.375 mV
Lead Channel Sensing Intrinsic Amplitude: 9.25 mV
Lead Channel Setting Pacing Amplitude: 4 V
Lead Channel Setting Pacing Pulse Width: 0.7 ms
Lead Channel Setting Sensing Sensitivity: 0.3 mV

## 2021-05-07 MED ORDER — SPIRONOLACTONE 25 MG PO TABS: 12.5000 mg | ORAL_TABLET | Freq: Every day | ORAL | 0 refills | Status: DC

## 2021-05-07 NOTE — Progress Notes (Signed)
ICD check in clinic for Carelink alert for high RV output at 5.0V. Noted on device check that steady increase for RV impedance/RV threshold. RV lead checked first and noted to VP 3 beats followed with VS consistently while checking. Decided to check RV lead unipolar ~ 1.5V @ 1.14ms. Secondly checked bipolar ~ 2.0V @ 1.4ms. Lastly, checked bipolar with pulse width of 0.52ms ~ 2.0V. Confirmed EGM's with Dr. Quentin Ore and agreeable. RV Capture Management programmed from adaptive management to monitor. RV output programmed from 5.0V to 4.0V. RV pulse width programmed to 0.7 ms. Dr. Quentin Ore reviewed with changes and agreeable.  Patient discussed findings and verbalized understanding.   Routing to Dr. Curt Bears for review.

## 2021-05-29 ENCOUNTER — Other Ambulatory Visit (HOSPITAL_COMMUNITY): Payer: Self-pay | Admitting: Cardiology

## 2021-06-04 ENCOUNTER — Encounter (HOSPITAL_COMMUNITY): Payer: Self-pay | Admitting: Cardiology

## 2021-06-04 ENCOUNTER — Other Ambulatory Visit: Payer: Self-pay

## 2021-06-04 ENCOUNTER — Ambulatory Visit (HOSPITAL_COMMUNITY)
Admission: RE | Admit: 2021-06-04 | Discharge: 2021-06-04 | Disposition: A | Discharge status: Home / Self Care | Payer: 59 | Source: Ambulatory Visit | Attending: Cardiology | Admitting: Cardiology

## 2021-06-04 ENCOUNTER — Ambulatory Visit (HOSPITAL_BASED_OUTPATIENT_CLINIC_OR_DEPARTMENT_OTHER)
Admission: RE | Admit: 2021-06-04 | Discharge: 2021-06-04 | Disposition: A | Discharge status: Home / Self Care | Payer: 59 | Source: Ambulatory Visit | Attending: Cardiology | Admitting: Cardiology

## 2021-06-04 ENCOUNTER — Other Ambulatory Visit (HOSPITAL_COMMUNITY): Payer: Self-pay

## 2021-06-04 ENCOUNTER — Telehealth (HOSPITAL_COMMUNITY): Payer: Self-pay | Admitting: Pharmacist

## 2021-06-04 VITALS — BP 102/60 | HR 57 | Wt 232.4 lb

## 2021-06-04 DIAGNOSIS — E7849 Other hyperlipidemia: Secondary | ICD-10-CM

## 2021-06-04 DIAGNOSIS — I255 Ischemic cardiomyopathy: Secondary | ICD-10-CM | POA: Diagnosis not present

## 2021-06-04 DIAGNOSIS — I5022 Chronic systolic (congestive) heart failure: Secondary | ICD-10-CM

## 2021-06-04 DIAGNOSIS — E119 Type 2 diabetes mellitus without complications: Secondary | ICD-10-CM | POA: Diagnosis not present

## 2021-06-04 DIAGNOSIS — Z951 Presence of aortocoronary bypass graft: Secondary | ICD-10-CM | POA: Insufficient documentation

## 2021-06-04 DIAGNOSIS — I358 Other nonrheumatic aortic valve disorders: Secondary | ICD-10-CM | POA: Diagnosis not present

## 2021-06-04 DIAGNOSIS — I700 Atherosclerosis of aorta: Secondary | ICD-10-CM | POA: Insufficient documentation

## 2021-06-04 DIAGNOSIS — E785 Hyperlipidemia, unspecified: Secondary | ICD-10-CM | POA: Diagnosis not present

## 2021-06-04 DIAGNOSIS — I4891 Unspecified atrial fibrillation: Secondary | ICD-10-CM | POA: Insufficient documentation

## 2021-06-04 DIAGNOSIS — I509 Heart failure, unspecified: Secondary | ICD-10-CM

## 2021-06-04 LAB — BASIC METABOLIC PANEL
Anion gap: 7 (ref 5–15)
BUN: 19 mg/dL (ref 8–23)
CO2: 25 mmol/L (ref 22–32)
Calcium: 9.2 mg/dL (ref 8.9–10.3)
Chloride: 107 mmol/L (ref 98–111)
Creatinine, Ser: 1.15 mg/dL (ref 0.61–1.24)
GFR, Estimated: >60 mL/min (ref >60)
Glucose, Bld: 105 mg/dL — ABNORMAL HIGH (ref 70–99)
Potassium: 4.3 mmol/L (ref 3.5–5.1)
Sodium: 139 mmol/L (ref 135–145)

## 2021-06-04 LAB — LIPID PANEL
Cholesterol: 149 mg/dL (ref 0–200)
HDL: 39 mg/dL — ABNORMAL LOW (ref >40)
LDL Cholesterol: 79 mg/dL (ref 0–99)
Total CHOL/HDL Ratio: 3.8 RATIO
Triglycerides: 155 mg/dL — ABNORMAL HIGH (ref <150)
VLDL: 31 mg/dL (ref 0–40)

## 2021-06-04 LAB — ECHOCARDIOGRAM COMPLETE
Area-P 1/2: 3.33 cm2
S' Lateral: 4.2 cm
Single Plane A4C EF: 34 %

## 2021-06-04 MED ORDER — LOKELMA 10 G PO PACK: 10.0000 g | PACK | Freq: Every day | ORAL | 0 refills | Status: DC

## 2021-06-04 MED ORDER — SILDENAFIL CITRATE 50 MG PO TABS: 50.0000 mg | ORAL_TABLET | ORAL | 1 refills | Status: DC | PRN

## 2021-06-04 NOTE — Telephone Encounter (Signed)
Patient Advocate Encounter ?  ?Received notification from [employer] that prior authorization for [facility] is required. ?  ?PA submitted on CoverMyMeds ?Key [id number] ?Status is pending ?  ?Will continue to follow. ? ? ?Karle Plumber, PharmD, BCPS, BCCP, CPP ?Heart Failure Clinic Pharmacist ?[phone] ? ?

## 2021-06-04 NOTE — Telephone Encounter (Signed)
[facility] Patient Advocate Encounter ? ?Prior Authorization for [facility] has been approved.   ? ?PA# [id number] ?Effective dates: 06/04/21 through 06/05/22 ? ?Audry Riles, PharmD, BCPS, BCCP, CPP ?Heart Failure Clinic Pharmacist ?[phone] ? ? ?

## 2021-06-04 NOTE — Patient Instructions (Addendum)
Medication Changes: ? ?STOP Veltassa ? ?Start Lokelma 10g 1 packet daily ? ?Lab Work: ? ?Labs done today, your results will be available in MyChart, we will contact you for abnormal readings. ? ? ?Testing/Procedures: ? ?Repeat blood work in 7 days after starting your [facility] ? ?Referrals: ? ?none ? ?Special Instructions // Education: ? ?none ? ?Follow-Up in: 4 months (July 2023)  ** please call the office in June to arrange your follow up appointment** ? ?At the [facility], you and your health needs are our priority. We have a designated team specialized in the treatment of Heart Failure. This Care Team includes your primary Heart Failure Specialized Cardiologist (physician), Advanced Practice Providers (APPs- Physician Assistants and Nurse Practitioners), and Pharmacist who all work together to provide you with the care you need, when you need it.  ? ?You may see any of the following providers on your designated Care Team at your next follow up: ? ?Dr Glori Bickers ?Dr Loralie Champagne ?Darrick Grinder, NP ?Lyda Jester, PA ?Jessica Milford,NP ?Marlyce Huge, PA ?Audry Riles, PharmD ? ? ?Please be sure to bring in all your medications bottles to every appointment.  ? ?Need to Contact us: ? ?If you have any questions or concerns before your next appointment please send [place] a message through [place] or call our office at [phone].   ? ?TO LEAVE A MESSAGE FOR THE NURSE SELECT OPTION 2, PLEASE LEAVE A MESSAGE INCLUDING: ?YOUR NAME ?DATE OF BIRTH ?CALL BACK NUMBER ?REASON FOR CALL**this is important as we prioritize the call backs ? ?YOU WILL RECEIVE A CALL BACK THE SAME DAY AS LONG AS YOU CALL BEFORE 4:00 PM ? ? ?

## 2021-06-05 NOTE — Progress Notes (Signed)
?  ? ?Date:  06/05/2021  ? ?ID:  Ryan Durham, DOB [birth year], MRN [id number]   ?Provider location: [facility] ?Type of Visit: Established patient ? ?PCP:  Shelle Iron, MD  ?Cardiologist:  Dr. Shirlee Latch ?  ?History of Present Illness: ?Ryan Durham is a 66 y.o. male who has a history of CAD s/p CABG x 5 03/2017, systolic CHF 2/2 ICM, Afib in setting of STEMI, and tobacco abuse.  ?  ?Admitted 1/6 -> 04/09/17 with NSTEMI with sudden onset of chest tightness and SOB. Noted to be in rapid Afib on arrival. He spontaneously converted to NSR with treatment of his chest pain. Underwent LHC that showed critical 3vD. IABP placed with cardiogenic shock and TCTS consulted for CABG. Pt underwent CABG 03/31/2017 with no immediate complications. Pressors weaned and extubated as tolerated. Weaned off IABP POD #1. Pt diuresed with IV lasix and HF medications adjusted as tolerated.  Discharge weight 217 lbs.  ?  ?Medtronic ICD placed in 5/19. Echo in 5/20 with EF 20-25%.  Echo in 6/21 with EF 30-35%, global HK, normal RV size and systolic function.  Echo was done today and reviewed, EF 30-35%, mildly decreased RV systolic function.   ?  ?He returns for followup of CHF.  He is doing well symptomatically. No significant exertional dyspnea or chest pain.  No orthopnea/PND.  He does report diarrhea with Veltassa use.  ? ?Medtronic device interrogation: No VT/AF, stable thoracic impedance ? ?Labs (3/19): LDL 56, HDL 26, K 4.3, creatinine 0.73 ?Labs (5/19): K 5, creatinine 0.94  ?Labs (8/19): K 4.7, creatinine 1.05 ?Labs (11/19): K 4.1, creatinine 0.9 ?Labs (2/20): LDL 59, HDl 44, K 4.2, ceatinine 1.09 ?Labs (4/20): hgb 15.3, K 5, creatinine 1.09, LDL 61 ?Labs (8/20): K 4.9, creatinine 1.27 ?Labs (11/20): LDL 71, HDL 43 ?Labs (12/20): K 4.6, creatinine 1.18 ?Labs (6/21): K 4.5, creatinine 1.05 ?Labs (7/21): K 5, creatinine 1.31 ?Labs (10/21): K 4.8, creatinine 1.22 ?Labs (11/22): K 4.8 => 5.6, creatinine 1.32 => 1.23,  LDL 65 ?  ?Past Medical History ?1. Chronic systolic CHF: ECHO 03/30/2017 EF 25-30%. Ischemic cardiomyopathy, now s/p CABG.  ?- Echo (4/19): EF 30-35%, wall motion abnormalities, grade 2 diastolic dysfunction, normal RV size and systolic function.   ?- Medtronic ICD.  ?- Echo (5/20): EF 20-25%, normal RV size and systolic function.  ?- Echo (6/21): EF 30-35%, global HK, normal RV size and systolic function. ?- Echo (3/23):  EF 30-35%, mildly decreased RV systolic function ?2. CAD: Strong FH premature CAD. NSTEMI 1/19, now s/p CABG 03/2017 ?3. Atrial fibrillation: Paroxysmal. Noted only in setting of NSTEMI.  ?4. Smoking: Has quit.  ?5. Carotid stenosis: Carotid dopplers (11/19) with 40-59% BICA stenosis.  ?- Carotid dopplers (11/20): 40-59% RICA ?- Carotid dopplers (10/21): 40-59% BICA ?- Carotid dopplers (10/22): 40-59% BICA ? ?Current Outpatient Medications  ?Medication Sig Dispense Refill  ? aspirin EC 81 MG EC tablet Take 1 tablet (81 mg total) by mouth daily.    ? atorvastatin (LIPITOR) 80 MG tablet Take one tablet daily 30 tablet 6  ? carvedilol (COREG) 25 MG tablet TAKE 1 TABLET(25 MG) BY MOUTH TWICE DAILY WITH A MEAL 180 tablet 3  ? empagliflozin (JARDIANCE) 10 MG TABS tablet Take 1 tablet (10 mg total) by mouth daily before breakfast. 30 tablet 6  ? ENTRESTO 97-103 MG TAKE 1 TABLET BY MOUTH TWICE DAILY 60 tablet 6  ? sildenafil (VIAGRA) 50 MG tablet Take 1 tablet (50 mg total) by  mouth as needed for erectile dysfunction. 30 tablet 1  ? sodium zirconium cyclosilicate (LOKELMA) 10 g PACK packet Take 10 g by mouth daily. 30 packet 0  ? spironolactone (ALDACTONE) 25 MG tablet Take 0.5 tablets (12.5 mg total) by mouth daily. 90 tablet 0  ? ?No current facility-administered medications for this encounter.  ? ? ?Allergies:   Patient has no known allergies.  ? ?Social History:  The patient  reports that he has quit smoking. His smoking use included cigarettes. He has a 30.00 pack-year smoking history. He has never  used smokeless tobacco. He reports that he does not drink alcohol and does not use drugs.  ? ?Family History:  The patient's family history includes Cirrhosis in his mother; Heart attack in his sister; Myocarditis in his brother.  ? ?ROS:  Please see the history of present illness.   All other systems are personally reviewed and negative.  ? ?Exam:   ?BP 102/60   Pulse (!) 57   Wt 105.4 kg (232 lb 6.4 oz)   SpO2 96%   BMI 33.35 kg/m?  ?General: NAD ?Neck: No JVD, no thyromegaly or thyroid nodule.  ?Lungs: Clear to auscultation bilaterally with normal respiratory effort. ?CV: Nondisplaced PMI.  Heart regular S1/S2, no S3/S4, no murmur.  No peripheral edema.  No carotid bruit.  Normal pedal pulses.  ?Abdomen: Soft, nontender, no hepatosplenomegaly, no distention.  ?Skin: Intact without lesions or rashes.  ?Neurologic: Alert and oriented x 3.  ?Psych: Normal affect. ?Extremities: No clubbing or cyanosis.  ?HEENT: Normal.  ? ?Recent Labs: ?06/04/2021: BUN 19; Creatinine, Ser 1.15; Potassium 4.3; Sodium 139  ?Personally reviewed  ? ?Wt Readings from Last 3 Encounters:  ?06/04/21 105.4 kg (232 lb 6.4 oz)  ?02/04/21 104.9 kg (231 lb 3.2 oz)  ?01/07/21 107.5 kg (237 lb)  ?  ?ASSESSMENT AND PLAN: ? ?1. Chronic systolic CHF: ECHO 03/30/2017 EF 25-30%. Ischemic cardiomyopathy, now s/p CABG as below. Repeat echo in 4/19 with EF still low at 30-35% and echo in 5/20 showed EF 20-25%.  Echo in 6/21 showed EF 30-35%.  Echo today showed EF 30-35%. He now has a Medtronic ICD.  NYHA class I-II symptoms. He is not volume overloaded on exam or by Optivol.  ?- Continue Coreg 25 mg bid.    ?- Continue Entresto 97/103 bid. BMET today.  ?- Continue spironolactone 12.5 mg daily.  ?- He is on Veltassa to allow him to continue spironolactone but is bothered by diarrhea with Veltassa.  I will try replacing Veltassa with Lokelma 10 g daily to see if this is better-tolerated.  ?- Continue empagliflozin 10 mg daily.   ?2. CAD: Strong FH premature  CAD.  NSTEMI, now s/p CABG x 5 03/31/2017.  No chest pain.   ?- Continue ASA 81 and statin.  Good lipids in 11/22.  ?3. Atrial fibrillation: Paroxysmal. Only noted at initial admission in setting of severe CP/NSTEMI.  No palpitations. No recent atrial fibrillation by device interrogation. NSR today.  ?- If recurs, he will need to be anticoagulated (holding off for now). ?4. Smoking: Has not smoked since 1/19.  ?5. Carotid bruit: Moderate BICA stenosis in 10/22, repeat in 10/23.   ? ?Followup in 4 months ? ?Signed, ?Marca Ancona, MD  ?06/05/2021 ? ?[facility] ?[place] ?[address] ?[facility] ?[place] [address] ?[phone] (office) ?[phone] (fax) ?

## 2021-06-13 ENCOUNTER — Other Ambulatory Visit: Payer: Self-pay | Admitting: Cardiology

## 2021-06-14 ENCOUNTER — Other Ambulatory Visit: Payer: Self-pay | Admitting: Cardiology

## 2021-06-15 LAB — BASIC METABOLIC PANEL
BUN/Creatinine Ratio: 19 (ref 10–24)
BUN: 23 mg/dL (ref 8–27)
CO2: 22 mmol/L (ref 20–29)
Calcium: 8.9 mg/dL (ref 8.6–10.2)
Chloride: 103 mmol/L (ref 96–106)
Creatinine, Ser: 1.21 mg/dL (ref 0.76–1.27)
Glucose: 112 mg/dL — ABNORMAL HIGH (ref 70–99)
Potassium: 4.8 mmol/L (ref 3.5–5.2)
Sodium: 141 mmol/L (ref 134–144)
eGFR: 67 mL/min/{1.73_m2} (ref >59)

## 2021-06-24 ENCOUNTER — Other Ambulatory Visit (HOSPITAL_COMMUNITY): Payer: Self-pay | Admitting: Cardiology

## 2021-06-29 ENCOUNTER — Other Ambulatory Visit (HOSPITAL_COMMUNITY): Payer: Self-pay | Admitting: Cardiology

## 2021-07-30 ENCOUNTER — Ambulatory Visit (INDEPENDENT_AMBULATORY_CARE_PROVIDER_SITE_OTHER): Payer: 59

## 2021-07-30 DIAGNOSIS — I255 Ischemic cardiomyopathy: Secondary | ICD-10-CM

## 2021-07-30 DIAGNOSIS — I5022 Chronic systolic (congestive) heart failure: Secondary | ICD-10-CM

## 2021-07-31 LAB — CUP PACEART REMOTE DEVICE CHECK
Battery Remaining Longevity: 65 mo
Battery Voltage: 2.98 V
Brady Statistic RV Percent Paced: 0.02 %
Date Time Interrogation Session: 20230509151708
HighPow Impedance: 80 Ohm
Implantable Lead Implant Date: 20190502
Implantable Lead Location: 753860
Implantable Pulse Generator Implant Date: 20190502
Lead Channel Impedance Value: 1349 Ohm
Lead Channel Impedance Value: 1672 Ohm
Lead Channel Pacing Threshold Amplitude: 2.5 V
Lead Channel Pacing Threshold Pulse Width: 0.4 ms
Lead Channel Sensing Intrinsic Amplitude: 9.875 mV
Lead Channel Sensing Intrinsic Amplitude: 9.875 mV
Lead Channel Setting Pacing Amplitude: 4 V
Lead Channel Setting Pacing Pulse Width: 0.7 ms
Lead Channel Setting Sensing Sensitivity: 0.3 mV

## 2021-08-02 ENCOUNTER — Telehealth: Payer: Self-pay

## 2021-08-02 NOTE — Telephone Encounter (Signed)
I have tried calling patient LVM for patient to call back ?

## 2021-08-05 NOTE — Telephone Encounter (Signed)
Second unsuccessful telephone encounter to patient to request manual transmission to reassess for V cap at high output as last remote 5/9 indicated. Detailed message left requesting call back and need for manual remote transmission.  ? ? ? ? ? ? ?

## 2021-08-13 NOTE — Progress Notes (Signed)
Remote ICD transmission.   

## 2021-08-23 NOTE — Telephone Encounter (Signed)
Third unsuccessful telephone encounter to patient to request manual transmission to reassess for V cap at high output as last remote 5/9 indicated. Unable to leave message as "call cannot be completed at this time". Certified Letter sent.

## 2021-08-28 ENCOUNTER — Other Ambulatory Visit (HOSPITAL_COMMUNITY): Payer: Self-pay | Admitting: Cardiology

## 2021-08-28 NOTE — Telephone Encounter (Signed)
Patient and wife called back. I let her know that a nurse will call her back. They stated they are going to a graduation and to call after 2

## 2021-08-28 NOTE — Telephone Encounter (Signed)
Patient needs to send manual transmission.

## 2021-08-30 NOTE — Telephone Encounter (Signed)
Incoming call from patient's wife Georgeann Oppenheim. Discussed with wife the need for patient to send remote transmission. Unfortunately wife states patient needs new transmitter. CMA working with [employer] to facilitate receipt of new transmitter on patient's behalf. New transmitter being expedited for receipt in 2-3 days. Patient is offered device clinic appointment for today however declines. He is also offered the opportunity to await and receive remote monitor and transmit at that time. Georgeann Oppenheim will discuss with patient and call back.   Upon call back it is decided that patient will present to device clinic appointment 09/09/21 at 8:15 am for manual device check and reprogramming as needed.

## 2021-08-30 NOTE — Telephone Encounter (Signed)
I ordered the patient a new handheld. He should receive it in 2-3 business days. I let his wife speak with Portia, rn.

## 2021-08-30 NOTE — Telephone Encounter (Signed)
Unsuccessful telephone call to patient to discuss need for manual transmission secondary to high RV output. Multiple attempts have been made and letter was sent. Wife responded to letter but requested to be called back. Today a detailed message was left per DPR.

## 2021-09-04 NOTE — Telephone Encounter (Signed)
RV threshold 2.250V@0 .50ms, patient VP<0.1% of note pacing impedance trending up at measurement 1729, alert pararmeter set at 2000ohms asked patient and patients wife to keep apt in [place] in order to potentially increase out range pacing impedance with MD approval

## 2021-09-04 NOTE — Telephone Encounter (Signed)
Transmission received. Let him speak with Benjamine Mola, rn.

## 2021-09-09 ENCOUNTER — Telehealth: Payer: Self-pay

## 2021-09-09 NOTE — Telephone Encounter (Signed)
Successful telephone encounter to patient's wife Georgeann Oppenheim. Wife states she got the detailed message regarding this mornings device clinic appointment cancellation. Wife is trying to contact patient to inform. Wife is thankful for phone call. Will continue to monitor patient's remote transmission for intermittent high RV thresholds.

## 2021-09-09 NOTE — Telephone Encounter (Signed)
Unsuccessful telephone call to Patient/wife to discuss todays in clinic device check for high RV threshold. Patient has received new remote monitor and transmission 09/04/21 indicates patient's ICD threshold has returned to his normal limits. Patient will not need in clinic check today. Attempted to contact patient prior to 8:15 appointment. Detailed message left and call back number provided.

## 2021-09-27 ENCOUNTER — Telehealth (HOSPITAL_COMMUNITY): Payer: Self-pay | Admitting: Vascular Surgery

## 2021-09-27 ENCOUNTER — Other Ambulatory Visit (HOSPITAL_COMMUNITY): Payer: Self-pay

## 2021-09-27 MED ORDER — ENTRESTO 97-103 MG PO TABS: 1.0000 | ORAL_TABLET | Freq: Two times a day (BID) | ORAL | 2 refills | Status: DC

## 2021-09-27 NOTE — Telephone Encounter (Signed)
Returned pt call to make f/u app w/ [employer]

## 2021-10-27 ENCOUNTER — Other Ambulatory Visit (HOSPITAL_COMMUNITY): Payer: Self-pay | Admitting: Cardiology

## 2021-10-28 ENCOUNTER — Telehealth (HOSPITAL_COMMUNITY): Payer: Self-pay | Admitting: Pharmacy Technician

## 2021-10-28 ENCOUNTER — Other Ambulatory Visit (HOSPITAL_COMMUNITY): Payer: Self-pay

## 2021-10-28 NOTE — Telephone Encounter (Signed)
Advanced Heart Failure Patient Advocate Encounter  Patient's wife called stating that the pharmacy informed her that the co-pay card for [facility] had ended. The co-pay card has reached its annual limit. The 30 day co-pay is $55. I asked if that was affordable for the patient. We can try to apply for manufacturer assistance. It would last until the end of the year but an application and POI for the household are required. The patient declined that option at this time and is going to pay the $55 monthly co-pay. Advised the patient's wife to call back if they have any further questions.  Archer Asa, CPhT

## 2021-10-29 ENCOUNTER — Ambulatory Visit (INDEPENDENT_AMBULATORY_CARE_PROVIDER_SITE_OTHER): Payer: 59

## 2021-10-29 ENCOUNTER — Other Ambulatory Visit (HOSPITAL_COMMUNITY): Payer: Self-pay

## 2021-10-29 DIAGNOSIS — I255 Ischemic cardiomyopathy: Secondary | ICD-10-CM | POA: Diagnosis not present

## 2021-10-29 MED ORDER — EMPAGLIFLOZIN 10 MG PO TABS: 10.0000 mg | ORAL_TABLET | Freq: Every day | ORAL | 3 refills | Status: DC

## 2021-10-30 LAB — CUP PACEART REMOTE DEVICE CHECK
Battery Remaining Longevity: 64 mo
Battery Voltage: 2.98 V
Brady Statistic RV Percent Paced: 0.03 %
Date Time Interrogation Session: 20230808133727
HighPow Impedance: 79 Ohm
Implantable Lead Implant Date: 20190502
Implantable Lead Location: 753860
Implantable Pulse Generator Implant Date: 20190502
Lead Channel Impedance Value: 1406 Ohm
Lead Channel Impedance Value: 1729 Ohm
Lead Channel Pacing Threshold Amplitude: 2 V
Lead Channel Pacing Threshold Pulse Width: 0.4 ms
Lead Channel Sensing Intrinsic Amplitude: 9.75 mV
Lead Channel Sensing Intrinsic Amplitude: 9.75 mV
Lead Channel Setting Pacing Amplitude: 4 V
Lead Channel Setting Pacing Pulse Width: 0.7 ms
Lead Channel Setting Sensing Sensitivity: 0.3 mV

## 2021-12-04 NOTE — Progress Notes (Signed)
Remote ICD transmission.   

## 2021-12-23 ENCOUNTER — Ambulatory Visit (HOSPITAL_COMMUNITY)
Admission: RE | Admit: 2021-12-23 | Discharge: 2021-12-23 | Disposition: A | Discharge status: Home / Self Care | Payer: 59 | Source: Ambulatory Visit | Attending: Cardiology | Admitting: Cardiology

## 2021-12-23 VITALS — BP 100/70 | HR 71 | Wt 236.0 lb

## 2021-12-23 DIAGNOSIS — I5022 Chronic systolic (congestive) heart failure: Secondary | ICD-10-CM | POA: Diagnosis not present

## 2021-12-23 DIAGNOSIS — Z95810 Presence of automatic (implantable) cardiac defibrillator: Secondary | ICD-10-CM | POA: Diagnosis not present

## 2021-12-23 DIAGNOSIS — I2510 Atherosclerotic heart disease of native coronary artery without angina pectoris: Secondary | ICD-10-CM | POA: Insufficient documentation

## 2021-12-23 DIAGNOSIS — Z951 Presence of aortocoronary bypass graft: Secondary | ICD-10-CM | POA: Insufficient documentation

## 2021-12-23 DIAGNOSIS — I252 Old myocardial infarction: Secondary | ICD-10-CM | POA: Insufficient documentation

## 2021-12-23 DIAGNOSIS — I480 Paroxysmal atrial fibrillation: Secondary | ICD-10-CM | POA: Diagnosis not present

## 2021-12-23 LAB — BASIC METABOLIC PANEL
Anion gap: 8 (ref 5–15)
BUN: 17 mg/dL (ref 8–23)
CO2: 24 mmol/L (ref 22–32)
Calcium: 9.3 mg/dL (ref 8.9–10.3)
Chloride: 107 mmol/L (ref 98–111)
Creatinine, Ser: 1.16 mg/dL (ref 0.61–1.24)
GFR, Estimated: >60 mL/min (ref >60)
Glucose, Bld: 163 mg/dL — ABNORMAL HIGH (ref 70–99)
Potassium: 4.2 mmol/L (ref 3.5–5.1)
Sodium: 139 mmol/L (ref 135–145)

## 2021-12-23 NOTE — Patient Instructions (Signed)
There has been no changes to your medications.  Labs done today, your results will be available in MyChart, we will contact you for abnormal readings.  Your provider has ordered Carotid Dopplers for you. ONCE APPROVED BY YOUR INSURANCE COMPANY YOU WILL BE CALLED TO HAVE THE TEST ARRANGED.   Your physician recommends that you schedule a follow-up appointment in: 4 months ( February 2024)  ** please call the office in November to arrange your follow up appointment **  If you have any questions or concerns before your next appointment please send [place] a message through [place] or call our office at [phone].    TO LEAVE A MESSAGE FOR THE NURSE SELECT OPTION 2, PLEASE LEAVE A MESSAGE INCLUDING: YOUR NAME DATE OF BIRTH CALL BACK NUMBER REASON FOR CALL**this is important as we prioritize the call backs  YOU WILL RECEIVE A CALL BACK THE SAME DAY AS LONG AS YOU CALL BEFORE 4:00 PM  At the [facility], you and your health needs are our priority. As part of our continuing mission to provide you with exceptional heart care, we have created designated Provider Care Teams. These Care Teams include your primary Cardiologist (physician) and Advanced Practice Providers (APPs- Physician Assistants and Nurse Practitioners) who all work together to provide you with the care you need, when you need it.   You may see any of the following providers on your designated Care Team at your next follow up: Dr Glori Bickers Dr Loralie Champagne Dr. Roxana Hires, NP Lyda Jester, [facility] [place], [place] Na, NP Audry Riles, PharmD   Please be sure to bring in all your medications bottles to every appointment.

## 2021-12-24 ENCOUNTER — Other Ambulatory Visit (HOSPITAL_COMMUNITY): Payer: Self-pay

## 2021-12-24 MED ORDER — SPIRONOLACTONE 25 MG PO TABS: 12.5000 mg | ORAL_TABLET | Freq: Every day | ORAL | 3 refills | Status: DC

## 2021-12-24 NOTE — Progress Notes (Signed)
Date:  12/24/2021   ID:  Ryan Durham, DOB April 18, 1955, MRN [id number]   Provider location: [facility] Type of Visit: Established patient  PCP:  Charlotte Sanes, MD  Cardiologist:  Dr. Aundra Dubin   History of Present Illness: Ryan Durham is a 66 y.o. male who has a history of CAD s/p CABG x 5 [id number], systolic CHF 2/2 ICM, Afib in setting of STEMI, and tobacco abuse.    Admitted 1/6 -> 04/09/17 with NSTEMI with sudden onset of chest tightness and SOB. Noted to be in rapid Afib on arrival. He spontaneously converted to NSR with treatment of his chest pain. Underwent LHC that showed critical 3vD. IABP placed with cardiogenic shock and TCTS consulted for CABG. Pt underwent CABG 03/31/2017 with no immediate complications. Pressors weaned and extubated as tolerated. Weaned off IABP POD #1. Pt diuresed with IV lasix and HF medications adjusted as tolerated.  Discharge weight 217 lbs.    Medtronic ICD placed in 5/19. Echo in 5/20 with EF 20-25%.  Echo in 6/21 with EF 30-35%, global HK, normal RV size and systolic function.  Echo in 3/23 showed EF 30-35%, mildly decreased RV systolic function.     He returns for followup of CHF.  No further diarrhea since switching from [facility] to [facility].  No exertional dyspnea or chest pain.  No problems walking around [employer].  No lightheadedness.   ECG (personally reviewed): NSR, LAFB, septal Qs  Medtronic device interrogation: No VT/AF, stable thoracic impedance  Labs (3/19): LDL 56, HDL 26, K 4.3, creatinine 1.02 Labs (5/19): K 5, creatinine 0.94  Labs (8/19): K 4.7, creatinine 1.05 Labs (11/19): K 4.1, creatinine 0.9 Labs (2/20): LDL 59, HDl 44, K 4.2, ceatinine 1.09 Labs (4/20): hgb 15.3, K 5, creatinine 1.09, LDL 61 Labs (8/20): K 4.9, creatinine 1.27 Labs (11/20): LDL 71, HDL 43 Labs (12/20): K 4.6, creatinine 1.18 Labs (6/21): K 4.5, creatinine 1.05 Labs (7/21): K 5, creatinine 1.31 Labs (10/21): K 4.8, creatinine  1.22 Labs (11/22): K 4.8 => 5.6, creatinine 1.32 => 1.23, LDL 65 Labs (3/23): K 4.8, creatinine 1.2, LDL 79   Past Medical History 1. Chronic systolic CHF: ECHO [id number] EF 25-30%. Ischemic cardiomyopathy, now s/p CABG.  - Echo (4/19): EF 30-35%, wall motion abnormalities, grade 2 diastolic dysfunction, normal RV size and systolic function.   - Medtronic ICD.  - Echo (5/20): EF 20-25%, normal RV size and systolic function.  - Echo (6/21): EF 30-35%, global HK, normal RV size and systolic function. - Echo (3/23):  EF 30-35%, mildly decreased RV systolic function 2. CAD: Strong FH premature CAD. NSTEMI 1/19, now s/p CABG 03/2017 3. Atrial fibrillation: Paroxysmal. Noted only in setting of NSTEMI.  4. Smoking: Has quit.  5. Carotid stenosis: Carotid dopplers (11/19) with 40-59% BICA stenosis.  - Carotid dopplers (11/20): 40-59% RICA - Carotid dopplers (10/21): 40-59% BICA - Carotid dopplers (10/22): 40-59% BICA  Current Outpatient Medications  Medication Sig Dispense Refill   aspirin EC 81 MG EC tablet Take 1 tablet (81 mg total) by mouth daily.     atorvastatin (LIPITOR) 80 MG tablet TAKE 1 TABLET BY MOUTH DAILY 30 tablet 6   carvedilol (COREG) 25 MG tablet TAKE 1 TABLET(25 MG) BY MOUTH TWICE DAILY WITH A MEAL 180 tablet 3   empagliflozin (JARDIANCE) 10 MG TABS tablet Take 1 tablet (10 mg total) by mouth daily before breakfast. 30 tablet 3   LOKELMA 10 g PACK packet MIX AND DRINK 1  PACKET BY MOUTH EVERY DAY 30 each 4   sacubitril-valsartan (ENTRESTO) 97-103 MG Take 1 tablet by mouth 2 (two) times daily. Please call office to schedule an appointment for further refills 60 tablet 2   sildenafil (VIAGRA) 50 MG tablet Take 1 tablet (50 mg total) by mouth as needed for erectile dysfunction. 30 tablet 1   spironolactone (ALDACTONE) 25 MG tablet Take 0.5 tablets (12.5 mg total) by mouth daily. 90 tablet 0   No current facility-administered medications for this encounter.    Allergies:    Patient has no known allergies.   Social History:  The patient  reports that he has quit smoking. His smoking use included cigarettes. He has a 30.00 pack-year smoking history. He has never used smokeless tobacco. He reports that he does not drink alcohol and does not use drugs.   Family History:  The patient's family history includes Cirrhosis in his mother; Heart attack in his sister; Myocarditis in his brother.   ROS:  Please see the history of present illness.   All other systems are personally reviewed and negative.   Exam:   BP 100/70   Pulse 71   Wt 107 kg (236 lb)   SpO2 96%   BMI 33.86 kg/m  General: NAD Neck: No JVD, no thyromegaly or thyroid nodule.  Lungs: Clear to auscultation bilaterally with normal respiratory effort. CV: Nondisplaced PMI.  Heart regular S1/S2, no S3/S4, no murmur.  No peripheral edema.  No carotid bruit.  Normal pedal pulses.  Abdomen: Soft, nontender, no hepatosplenomegaly, no distention.  Skin: Intact without lesions or rashes.  Neurologic: Alert and oriented x 3.  Psych: Normal affect. Extremities: No clubbing or cyanosis.  HEENT: Normal.   Recent Labs: 12/23/2021: BUN 17; Creatinine, Ser 1.16; Potassium 4.2; Sodium 139  Personally reviewed   Wt Readings from Last 3 Encounters:  12/23/21 107 kg (236 lb)  06/04/21 105.4 kg (232 lb 6.4 oz)  02/04/21 104.9 kg (231 lb 3.2 oz)    ASSESSMENT AND PLAN:  1. Chronic systolic CHF: ECHO 06/26/8097 EF 25-30%. Ischemic cardiomyopathy, now s/p CABG as below. Repeat echo in 4/19 with EF still low at 30-35% and echo in 5/20 showed EF 20-25%.  Echo in 6/21 showed EF 30-35%.  Echo in 3/23 showed EF 30-35%. Medtronic ICD.  NYHA class I-II symptoms. He is not volume overloaded on exam or by Optivol.  - Continue Coreg 25 mg bid.    - Continue Entresto 97/103 bid. BMET today.  - Continue spironolactone 12.5 mg daily.  - Continue Lokelma to allow him to continue spironolactone. BMET today.  - Continue empagliflozin  10 mg daily.   2. CAD: Strong FH premature CAD.  NSTEMI, now s/p CABG x 5 03/31/2017.  No chest pain.   - Continue ASA 81 and statin.  Good lipids in 3/23.  3. Atrial fibrillation: Paroxysmal. Only noted at initial admission in setting of severe CP/NSTEMI.  No palpitations. No recent atrial fibrillation by device interrogation. NSR today.  - If recurs, he will need to be anticoagulated (holding off for now). 4. Smoking: Has not smoked since 1/19.  5. Carotid bruit: Moderate BICA stenosis in 10/22, I will arrange for repeat carotid dopplers.   Followup in 4 months with APP  Signed, Loralie Champagne, MD  12/24/2021  [facility] [address] Heart and [place] [address] [phone] (office) [phone] (fax)

## 2021-12-31 ENCOUNTER — Encounter: Payer: Self-pay | Admitting: Cardiology

## 2022-01-12 ENCOUNTER — Other Ambulatory Visit (HOSPITAL_COMMUNITY): Payer: Self-pay | Admitting: Cardiology

## 2022-01-24 ENCOUNTER — Ambulatory Visit (HOSPITAL_COMMUNITY)
Admission: RE | Admit: 2022-01-24 | Discharge: 2022-01-24 | Disposition: A | Discharge status: Home / Self Care | Payer: 59 | Source: Ambulatory Visit | Attending: Cardiology | Admitting: Cardiology

## 2022-01-24 DIAGNOSIS — I6523 Occlusion and stenosis of bilateral carotid arteries: Secondary | ICD-10-CM | POA: Diagnosis present

## 2022-01-28 ENCOUNTER — Ambulatory Visit (INDEPENDENT_AMBULATORY_CARE_PROVIDER_SITE_OTHER): Payer: 59

## 2022-01-28 DIAGNOSIS — I255 Ischemic cardiomyopathy: Secondary | ICD-10-CM

## 2022-01-28 DIAGNOSIS — Z95810 Presence of automatic (implantable) cardiac defibrillator: Secondary | ICD-10-CM | POA: Diagnosis not present

## 2022-01-28 LAB — CUP PACEART REMOTE DEVICE CHECK
Battery Remaining Longevity: 60 mo
Battery Voltage: 2.98 V
Brady Statistic RV Percent Paced: 0.03 %
Date Time Interrogation Session: 20231107012504
HighPow Impedance: 77 Ohm
Implantable Lead Connection Status: 753985
Implantable Lead Implant Date: 20190502
Implantable Lead Location: 753860
Implantable Pulse Generator Implant Date: 20190502
Lead Channel Impedance Value: 1482 Ohm
Lead Channel Impedance Value: 1748 Ohm
Lead Channel Pacing Threshold Amplitude: 2.5 V
Lead Channel Pacing Threshold Pulse Width: 0.4 ms
Lead Channel Sensing Intrinsic Amplitude: 12.75 mV
Lead Channel Sensing Intrinsic Amplitude: 12.75 mV
Lead Channel Setting Pacing Amplitude: 4 V
Lead Channel Setting Pacing Pulse Width: 0.7 ms
Lead Channel Setting Sensing Sensitivity: 0.3 mV
Zone Setting Status: 755011
Zone Setting Status: 755011

## 2022-02-07 ENCOUNTER — Other Ambulatory Visit (HOSPITAL_COMMUNITY): Payer: Self-pay | Admitting: Cardiology

## 2022-02-24 NOTE — Progress Notes (Signed)
Remote ICD transmission.   

## 2022-03-05 ENCOUNTER — Other Ambulatory Visit (HOSPITAL_COMMUNITY): Payer: Self-pay | Admitting: Cardiology

## 2022-04-08 ENCOUNTER — Other Ambulatory Visit (HOSPITAL_COMMUNITY): Payer: Self-pay | Admitting: Cardiology

## 2022-04-09 ENCOUNTER — Encounter (HOSPITAL_COMMUNITY): Payer: Self-pay | Admitting: Pharmacy Technician

## 2022-04-09 ENCOUNTER — Other Ambulatory Visit (HOSPITAL_COMMUNITY): Payer: Self-pay

## 2022-04-09 ENCOUNTER — Telehealth (HOSPITAL_COMMUNITY): Payer: Self-pay | Admitting: Pharmacy Technician

## 2022-04-09 NOTE — Telephone Encounter (Signed)
Advanced Heart Failure Patient Advocate Encounter  Patient's wife left vm stating she needed Entresto coupon. Called and spoke with the patient's wife. She stated that his Entresto co-pay card expired. Sent Entresto co-pay card link via mychart.  Advised them to call back with issues.  Charlann Boxer, CPhT

## 2022-04-23 NOTE — Progress Notes (Incomplete)
Date:  04/23/2022   ID:  [name], DOB May 31, 1955, MRN [id number]   Provider location: [place] [facility] Type of Visit: Established patient  PCP:  Charlotte Sanes, MD  Cardiologist:  Dr. Aundra Dubin   History of Present Illness: Ryan Durham is a 67 y.o. male who has a history of CAD s/p CABG x 5 08/2227, systolic CHF 2/2 ICM, Afib in setting of STEMI, and tobacco abuse.    Admitted 1/6 -> 04/09/17 with NSTEMI with sudden onset of chest tightness and SOB. Noted to be in rapid Afib on arrival. He spontaneously converted to NSR with treatment of his chest pain. Underwent LHC that showed critical 3vD. IABP placed with cardiogenic shock and TCTS consulted for CABG. Pt underwent CABG 03/31/2017 with no immediate complications. Pressors weaned and extubated as tolerated. Weaned off IABP POD #1. Pt diuresed with IV lasix and HF medications adjusted as tolerated.  Discharge weight 217 lbs.    Medtronic ICD placed in 5/19. Echo in 5/20 with EF 20-25%.  Echo in 6/21 with EF 30-35%, global HK, normal RV size and systolic function.  Echo in 3/23 showed EF 30-35%, mildly decreased RV systolic function.     He returns for followup of CHF.  No further diarrhea since switching from [facility] to [facility].  No exertional dyspnea or chest pain.  No problems walking around [employer].  No lightheadedness.   ECG (personally reviewed): NSR, LAFB, septal Qs  Medtronic device interrogation: No VT/AF, stable thoracic impedance  Labs (3/19): LDL 56, HDL 26, K 4.3, creatinine 1.02 Labs (5/19): K 5, creatinine 0.94  Labs (8/19): K 4.7, creatinine 1.05 Labs (11/19): K 4.1, creatinine 0.9 Labs (2/20): LDL 59, HDl 44, K 4.2, ceatinine 1.09 Labs (4/20): hgb 15.3, K 5, creatinine 1.09, LDL 61 Labs (8/20): K 4.9, creatinine 1.27 Labs (11/20): LDL 71, HDL 43 Labs (12/20): K 4.6, creatinine 1.18 Labs (6/21): K 4.5, creatinine 1.05 Labs (7/21): K 5, creatinine 1.31 Labs (10/21): K 4.8, creatinine  1.22 Labs (11/22): K 4.8 => 5.6, creatinine 1.32 => 1.23, LDL 65 Labs (3/23): K 4.8, creatinine 1.2, LDL 79   Past Medical History 1. Chronic systolic CHF: ECHO 09/30/8919 EF 25-30%. Ischemic cardiomyopathy, now s/p CABG.  - Echo (4/19): EF 30-35%, wall motion abnormalities, grade 2 diastolic dysfunction, normal RV size and systolic function.   - Medtronic ICD.  - Echo (5/20): EF 20-25%, normal RV size and systolic function.  - Echo (6/21): EF 30-35%, global HK, normal RV size and systolic function. - Echo (3/23):  EF 30-35%, mildly decreased RV systolic function 2. CAD: Strong FH premature CAD. NSTEMI 1/19, now s/p CABG 03/2017 3. Atrial fibrillation: Paroxysmal. Noted only in setting of NSTEMI.  4. Smoking: Has quit.  5. Carotid stenosis: Carotid dopplers (11/19) with 40-59% BICA stenosis.  - Carotid dopplers (11/20): 40-59% RICA - Carotid dopplers (10/21): 40-59% BICA - Carotid dopplers (10/22): 40-59% BICA  Current Outpatient Medications  Medication Sig Dispense Refill   aspirin EC 81 MG EC tablet Take 1 tablet (81 mg total) by mouth daily.     atorvastatin (LIPITOR) 80 MG tablet TAKE 1 TABLET BY MOUTH DAILY 30 tablet 6   carvedilol (COREG) 25 MG tablet TAKE 1 TABLET(25 MG) BY MOUTH TWICE DAILY WITH A MEAL 180 tablet 3   ENTRESTO 97-103 MG TAKE 1 TABLET BY MOUTH TWICE DAILY 60 tablet 2   JARDIANCE 10 MG TABS tablet TAKE 1 TABLET(10 MG) BY MOUTH DAILY BEFORE BREAKFAST 30 tablet  3   LOKELMA 10 g PACK packet MIX AND DRINK 1 PACKET BY MOUTH EVERY DAY 30 each 4   sildenafil (VIAGRA) 50 MG tablet TAKE 1 TABLET BY MOUTH AS NEEDED FOR ERECTILE DYSFUNCTION 30 tablet 1   spironolactone (ALDACTONE) 25 MG tablet Take 0.5 tablets (12.5 mg total) by mouth daily. 90 tablet 3   No current facility-administered medications for this visit.    Allergies:   Patient has no known allergies.   Social History:  The patient  reports that he has quit smoking. His smoking use included cigarettes. He has a  30.00 pack-year smoking history. He has never used smokeless tobacco. He reports that he does not drink alcohol and does not use drugs.   Family History:  The patient's family history includes Cirrhosis in his mother; Heart attack in his sister; Myocarditis in his brother.   ROS:  Please see the history of present illness.   All other systems are personally reviewed and negative.   Exam:   There were no vitals taken for this visit. General: NAD Neck: No JVD, no thyromegaly or thyroid nodule.  Lungs: Clear to auscultation bilaterally with normal respiratory effort. CV: Nondisplaced PMI.  Heart regular S1/S2, no S3/S4, no murmur.  No peripheral edema.  No carotid bruit.  Normal pedal pulses.  Abdomen: Soft, nontender, no hepatosplenomegaly, no distention.  Skin: Intact without lesions or rashes.  Neurologic: Alert and oriented x 3.  Psych: Normal affect. Extremities: No clubbing or cyanosis.  HEENT: Normal.   Recent Labs: 12/23/2021: BUN 17; Creatinine, Ser 1.16; Potassium 4.2; Sodium 139  Personally reviewed   Wt Readings from Last 3 Encounters:  12/23/21 107 kg (236 lb)  06/04/21 105.4 kg (232 lb 6.4 oz)  02/04/21 104.9 kg (231 lb 3.2 oz)    ASSESSMENT AND PLAN:  1. Chronic systolic CHF: ECHO 10/25/6960 EF 25-30%. Ischemic cardiomyopathy, now s/p CABG as below. Repeat echo in 4/19 with EF still low at 30-35% and echo in 5/20 showed EF 20-25%.  Echo in 6/21 showed EF 30-35%.  Echo in 3/23 showed EF 30-35%. Medtronic ICD.  NYHA class I-II symptoms. He is not volume overloaded on exam or by Optivol.  - Continue Coreg 25 mg bid.    - Continue Entresto 97/103 bid. BMET today.  - Continue spironolactone 12.5 mg daily.  - Continue Lokelma to allow him to continue spironolactone. BMET today.  - Continue empagliflozin 10 mg daily.   2. CAD: Strong FH premature CAD.  NSTEMI, now s/p CABG x 5 03/31/2017.  No chest pain.   - Continue ASA 81 and statin.  Good lipids in 3/23.  3. Atrial  fibrillation: Paroxysmal. Only noted at initial admission in setting of severe CP/NSTEMI.  No palpitations. No recent atrial fibrillation by device interrogation. NSR today.  - If recurs, he will need to be anticoagulated (holding off for now). 4. Smoking: Has not smoked since 1/19.  5. Carotid bruit: Moderate BICA stenosis in 10/22, I will arrange for repeat carotid dopplers.   Followup in 4 months with APP  Signed, Rafael Bihari, FNP  04/23/2022  [facility] [address] Heart and [place] [address] [phone] (office) [phone] (fax)

## 2022-04-25 ENCOUNTER — Ambulatory Visit (HOSPITAL_COMMUNITY)
Admission: RE | Admit: 2022-04-25 | Discharge: 2022-04-25 | Disposition: A | Discharge status: Home / Self Care | Payer: 59 | Source: Ambulatory Visit | Attending: Family Medicine | Admitting: Family Medicine

## 2022-04-28 ENCOUNTER — Ambulatory Visit: Payer: 59 | Attending: Cardiology | Admitting: Cardiology

## 2022-04-28 ENCOUNTER — Encounter: Payer: Self-pay | Admitting: Cardiology

## 2022-04-28 VITALS — BP 110/66 | HR 63 | Ht 70.0 in | Wt 233.0 lb

## 2022-04-28 DIAGNOSIS — I5022 Chronic systolic (congestive) heart failure: Secondary | ICD-10-CM | POA: Diagnosis not present

## 2022-04-28 DIAGNOSIS — I2510 Atherosclerotic heart disease of native coronary artery without angina pectoris: Secondary | ICD-10-CM

## 2022-04-28 DIAGNOSIS — I472 Ventricular tachycardia, unspecified: Secondary | ICD-10-CM | POA: Diagnosis not present

## 2022-04-28 DIAGNOSIS — I480 Paroxysmal atrial fibrillation: Secondary | ICD-10-CM

## 2022-04-28 DIAGNOSIS — I255 Ischemic cardiomyopathy: Secondary | ICD-10-CM | POA: Diagnosis not present

## 2022-04-28 LAB — CUP PACEART INCLINIC DEVICE CHECK
Battery Remaining Longevity: 55 mo
Battery Voltage: 2.98 V
Brady Statistic RV Percent Paced: 0.02 %
Date Time Interrogation Session: 20240205120149
HighPow Impedance: 82 Ohm
Implantable Lead Connection Status: 753985
Implantable Lead Implant Date: 20190502
Implantable Lead Location: 753860
Implantable Pulse Generator Implant Date: 20190502
Lead Channel Impedance Value: 1539 Ohm
Lead Channel Impedance Value: 1824 Ohm
Lead Channel Pacing Threshold Amplitude: 2.5 V
Lead Channel Pacing Threshold Amplitude: 2.5 V
Lead Channel Pacing Threshold Pulse Width: 0.4 ms
Lead Channel Pacing Threshold Pulse Width: 0.7 ms
Lead Channel Sensing Intrinsic Amplitude: 12.75 mV
Lead Channel Sensing Intrinsic Amplitude: 13.75 mV
Lead Channel Setting Pacing Amplitude: 4 V
Lead Channel Setting Pacing Pulse Width: 0.7 ms
Lead Channel Setting Sensing Sensitivity: 0.3 mV
Zone Setting Status: 755011
Zone Setting Status: 755011

## 2022-04-28 NOTE — Progress Notes (Addendum)
Electrophysiology Office Note   Date:  04/28/2022   ID:  Ryan Durham, DOB 11/05/1955, MRN [id number]  PCP:  Charlotte Sanes, MD  Cardiologist:  Aundra Dubin Primary Electrophysiologist:  Constance Haw, MD    No chief complaint on file.     History of Present Illness: Ryan Durham is a 67 y.o. male who is being seen today for the evaluation of CHF at the request of Loralie Champagne. Presenting today for electrophysiology evaluation.    She is significant for coronary artery disease post CABG in 1856, chronic systolic heart failure due to ischemic cardiomyopathy, atrial fibrillation in the setting of STEMI, tobacco abuse.  In 2019 he had the abrupt onset of chest tightness.  He was noted to be in rapid atrial fibrillation.  Left heart catheterization showed three-vessel disease.  He is post CABG x 5.  He has an ejection fraction of 30 to 35% and is post Medtronic ICD implanted 07/23/2017.  Today, denies symptoms of palpitations, chest pain, shortness of breath, orthopnea, PND, lower extremity edema, claudication, dizziness, presyncope, syncope, bleeding, or neurologic sequela. The patient is tolerating medications without difficulties.     Past Medical History:  Diagnosis Date   Acute on chronic systolic heart failure ([place]) 03/29/2017   Anginal pain ([place])    this admission   Cardiomyopathy, ischemic 03/30/2017   CHF (congestive heart failure) ([place])    Coronary artery disease    this admission   Coronary artery disease involving native coronary artery of native heart with unstable angina pectoris ([place]) 03/29/2017   Dyspnea    this admission   Dysrhythmia    afib with this admission/runs of VTACH/Afib both with this admission   Hyperlipidemia    Myocardial infarction [facility])    this admission   Paroxysmal atrial fibrillation ([place]) 03/29/2017   Tobacco abuse    Type II diabetes mellitus ([place]) 03/30/2017   Past Surgical History:  Procedure Laterality Date   CLIPPING OF ATRIAL APPENDAGE  Left 03/31/2017   Procedure: CLIPPING OF ATRIAL APPENDAGE;  Surgeon: Rexene Alberts, MD;  Location: [facility];  Service: Open Heart Surgery;  Laterality: Left;   CORONARY ANGIOPLASTY  03/30/2017   CORONARY ARTERY BYPASS GRAFT N/A 03/31/2017   Procedure: CORONARY ARTERY BYPASS GRAFTING (CABG) time 5 using bilateral saphaneous vien, harvested endoscopicly and right internal mammary artery.;  Surgeon: Rexene Alberts, MD;  Location: [facility];  Service: Open Heart Surgery;  Laterality: N/A;   CORONARY/GRAFT ACUTE MI REVASCULARIZATION N/A 03/29/2017   Procedure: Coronary/Graft Acute MI Revascularization;  Surgeon: Burnell Blanks, MD;  Location: [facility] CV LAB;  Service: Cardiovascular;  Laterality: N/A;   IABP INSERTION N/A 03/29/2017   Procedure: IABP Insertion;  Surgeon: Burnell Blanks, MD;  Location: [place] CV LAB;  Service: Cardiovascular;  Laterality: N/A;   ICD IMPLANT N/A 07/23/2017   Procedure: ICD IMPLANT;  Surgeon: Constance Haw, MD;  Location: [facility] CV LAB;  Service: Cardiovascular;  Laterality: N/A;   LEFT HEART CATH AND CORONARY ANGIOGRAPHY N/A 03/29/2017   Procedure: LEFT HEART CATH AND CORONARY ANGIOGRAPHY;  Surgeon: Burnell Blanks, MD;  Location: [facility] CV LAB;  Service: Cardiovascular;  Laterality: N/A;   TEE WITHOUT CARDIOVERSION N/A 03/31/2017   Procedure: TRANSESOPHAGEAL ECHOCARDIOGRAM (TEE);  Surgeon: Rexene Alberts, MD;  Location: [facility];  Service: Open Heart Surgery;  Laterality: N/A;     Current Outpatient Medications  Medication Sig Dispense Refill   aspirin EC 81 MG EC tablet Take 1  tablet (81 mg total) by mouth daily.     atorvastatin (LIPITOR) 80 MG tablet TAKE 1 TABLET BY MOUTH DAILY 30 tablet 6   carvedilol (COREG) 25 MG tablet TAKE 1 TABLET(25 MG) BY MOUTH TWICE DAILY WITH A MEAL 180 tablet 3   ENTRESTO 97-103 MG TAKE 1 TABLET BY MOUTH TWICE DAILY 60 tablet 2   JARDIANCE 10 MG TABS tablet TAKE 1 TABLET(10 MG) BY MOUTH DAILY BEFORE  BREAKFAST 30 tablet 3   LOKELMA 10 g PACK packet MIX AND DRINK 1 PACKET BY MOUTH EVERY DAY 30 each 4   sildenafil (VIAGRA) 50 MG tablet TAKE 1 TABLET BY MOUTH AS NEEDED FOR ERECTILE DYSFUNCTION 30 tablet 1   spironolactone (ALDACTONE) 25 MG tablet Take 0.5 tablets (12.5 mg total) by mouth daily. 90 tablet 3   No current facility-administered medications for this visit.    Allergies:   Patient has no known allergies.   Social History:  The patient  reports that he has quit smoking. His smoking use included cigarettes. He has a 30.00 pack-year smoking history. He has never used smokeless tobacco. He reports that he does not drink alcohol and does not use drugs.   Family History:  The patient's family history includes Cirrhosis in his mother; Heart attack in his sister; Myocarditis in his brother.   ROS:  Please see the history of present illness.   Otherwise, review of systems is positive for none.   All other systems are reviewed and negative.   PHYSICAL EXAM: VS:  BP 110/66   Pulse 63   Ht 5\' 10"  (1.778 m)   Wt 233 lb (105.7 kg)   SpO2 96%   BMI 33.43 kg/m  , BMI Body mass index is 33.43 kg/m. GEN: Well nourished, well developed, in no acute distress  HEENT: normal  Neck: no JVD, carotid bruits, or masses Cardiac: RRR; no murmurs, rubs, or gallops,no edema  Respiratory:  clear to auscultation bilaterally, normal work of breathing GI: soft, nontender, nondistended, + BS MS: no deformity or atrophy  Skin: warm and dry, device site well healed Neuro:  Strength and sensation are intact Psych: euthymic mood, full affect  EKG:  EKG is not ordered today. Personal review of the ekg ordered 12/23/21 shows sinus rhythm, septal Q waves, rate rate 64  Personal review of the device interrogation today. Results in [place]: 12/23/2021: BUN 17; Creatinine, Ser 1.16; Potassium 4.2; Sodium 139    Lipid Panel     Component Value Date/Time   CHOL 149 06/04/2021 1456   TRIG  155 (H) 06/04/2021 1456   HDL 39 (L) 06/04/2021 1456   CHOLHDL 3.8 06/04/2021 1456   VLDL 31 06/04/2021 1456   LDLCALC 79 06/04/2021 1456     Wt Readings from Last 3 Encounters:  04/28/22 233 lb (105.7 kg)  12/23/21 236 lb (107 kg)  06/04/21 232 lb 6.4 oz (105.4 kg)      Other studies Reviewed: Additional studies/ records that were reviewed today include: TTE 09/15/2019  Review of the above records today demonstrates:   1. Left ventricular ejection fraction, by estimation, is 30 to 35%. The  left ventricle has moderately decreased function. The left ventricle  demonstrates global hypokinesis. Left ventricular diastolic parameters are  consistent with Grade II diastolic  dysfunction (pseudonormalization).   2. Right ventricular systolic function is normal. The right ventricular  size is normal. Tricuspid regurgitation signal is inadequate for assessing  PA pressure.   3. The  mitral valve is normal in structure. No evidence of mitral valve  regurgitation. No evidence of mitral stenosis.   4. The aortic valve is tricuspid. Aortic valve regurgitation is not  visualized. Mild to moderate aortic valve sclerosis/calcification is  present, without any evidence of aortic stenosis.   5. The inferior vena cava is normal in size with greater than 50%  respiratory variability, suggesting right atrial pressure of 3 mmHg.  LHC 03/30/17 Prox RCA lesion is 80% stenosed. Prox RCA to Dist RCA lesion is 30% stenosed. Ost RPDA lesion is 99% stenosed. Post Atrio lesion is 80% stenosed. Dist LM to Ost LAD lesion is 100% stenosed. Ost 1st Mrg lesion is 100% stenosed. Ost Cx to Prox Cx lesion is 99% stenosed. Ost 2nd Mrg to 2nd Mrg lesion is 70% stenosed.   ASSESSMENT AND PLAN:  1.  Chronic systolic heart failure: Due to ischemic cardiomyopathy.  Currently on optimal medical therapy.  Status post Medtronic ICD implanted 07/23/2017.  RV threshold is elevated, though other parameters are within normal  limits.  Lashay Osborne continue to monitor.  2.  Coronary artery disease post CABG.  No current chest pain.  Plan per primary cardiology.  3.  Paroxysmal atrial fibrillation: Occurred in the setting of STEMI.  No further episodes on device interrogation.  Madolyn Ackroyd need anticoagulation if he has more atrial fibrillation.  4.  Tobacco abuse: Complete cessation encouraged  I was chaperoned during this clinic visit by Trinidad Curet, Myrtie Hawk, Geoffry Paradise.   Current medicines are reviewed at length with the patient today.   The patient does not have concerns regarding his medicines.  The following changes were made today: None  Labs/ tests ordered today include:  Orders Placed This Encounter  Procedures   CUP [place]     Disposition:   FU with Macdonald Rigor 12 months  Signed, Alichia Alridge Meredith Leeds, MD  04/28/2022 12:37 PM     [facility] [address] [place] [address] [phone] (office) [phone] (fax)

## 2022-04-28 NOTE — Patient Instructions (Signed)
Medication Instructions:  Your physician recommends that you continue on your current medications as directed. Please refer to the Current Medication list given to you today.  *If you need a refill on your cardiac medications before your next appointment, please call your pharmacy*   Lab Work: None ordered   Testing/Procedures: None ordered   Follow-Up: At [facility], you and your health needs are our priority.  As part of our continuing mission to provide you with exceptional heart care, we have created designated Provider Care Teams.  These Care Teams include your primary Cardiologist (physician) and Advanced Practice Providers (APPs -  Physician Assistants and Nurse Practitioners) who all work together to provide you with the care you need, when you need it.  Remote monitoring is used to monitor your Pacemaker or ICD from home. This monitoring reduces the number of office visits required to check your device to one time per year. It allows [place] to keep an eye on the functioning of your device to ensure it is working properly. You are scheduled for a device check from home on 04/29/2021. You may send your transmission at any time that day. If you have a wireless device, the transmission will be sent automatically. After your physician reviews your transmission, you will receive a postcard with your next transmission date.  Your next appointment:   1 year(s)  The format for your next appointment:   In Person  Provider:   Allegra Lai, MD    Thank you for choosing [facility]!!   Trinidad Curet, RN [phone]

## 2022-05-07 ENCOUNTER — Other Ambulatory Visit (HOSPITAL_COMMUNITY): Payer: Self-pay | Admitting: Cardiology

## 2022-05-09 ENCOUNTER — Other Ambulatory Visit (HOSPITAL_COMMUNITY): Payer: Self-pay

## 2022-05-09 MED ORDER — LOKELMA 10 G PO PACK: PACK | ORAL | 4 refills | Status: DC

## 2022-05-15 ENCOUNTER — Telehealth (HOSPITAL_COMMUNITY): Payer: Self-pay | Admitting: Pharmacy Technician

## 2022-05-15 ENCOUNTER — Other Ambulatory Visit (HOSPITAL_COMMUNITY): Payer: Self-pay

## 2022-05-15 NOTE — Telephone Encounter (Signed)
Patient Advocate Encounter   Received notification from [facility] that prior authorization for [facility] is required.   PA submitted on CoverMyMeds Key [id number] Status is pending   Will continue to follow.

## 2022-05-21 NOTE — Telephone Encounter (Signed)
Advanced Heart Failure Patient Advocate Encounter  Prior Authorization for Milly Jakob has been approved.    PA#  [id number] Effective dates: 05/15/22 through 05/16/23  Charlann Boxer, CPhT

## 2022-05-29 NOTE — Progress Notes (Signed)
Date:  05/30/2022   ID:  Ryan Durham, DOB 09-Jan-1956, MRN [id number]   Provider location: [facility] Type of Visit: Established patient  PCP:  Ryan Sanes, MD  HF Cardiologist:  Dr. Aundra Durham   HPI: Ryan Durham is a 67 y.o. male who has a history of CAD s/p CABG x 5 11/6220, systolic CHF 2/2 ICM, Afib in setting of STEMI, and tobacco abuse.    Admitted 1/6 -> 04/09/17 with NSTEMI with sudden onset of chest tightness and SOB. Noted to be in rapid Afib on arrival. He spontaneously converted to NSR with treatment of his chest pain. Underwent LHC that showed critical 3vD. IABP placed with cardiogenic shock and TCTS consulted for CABG. Pt underwent CABG 03/31/2017 with no immediate complications. Pressors weaned and extubated as tolerated. Weaned off IABP POD #1. Pt diuresed with IV lasix and HF medications adjusted as tolerated.  Discharge weight 217 lbs.    Medtronic ICD placed in 5/19. Echo in 5/20 with EF 20-25%.  Echo in 6/21 with EF 30-35%, global HK, normal RV size and systolic function.  Echo in 3/23 showed EF 30-35%, mildly decreased RV systolic function.     Follow up 10/23, NYHA I-II and volume stable.  Today he returns for HF follow up. Overall feeling fine. He works 3rd shift at [employer]. He has no dyspnea with work duties or walking up steps. Denies palpitations, CP, dizziness, edema, or PND/Orthopnea. Appetite ok. No fever or chills. Weight at home 230  pounds. Taking all medications. Main complaint is sleep disturbance with working 3rd shift.  ECG (personally reviewed): NSR 60 bpm  Medtronic device interrogation: No VT/AF, stable thoracic impedance, 4 hrs/day activity.  Labs (3/19): LDL 56, HDL 26, K 4.3, creatinine 1.02 Labs (5/19): K 5, creatinine 0.94  Labs (8/19): K 4.7, creatinine 1.05 Labs (11/19): K 4.1, creatinine 0.9 Labs (2/20): LDL 59, HDl 44, K 4.2, ceatinine 1.09 Labs (4/20): hgb 15.3, K 5, creatinine 1.09, LDL 61 Labs  (8/20): K 4.9, creatinine 1.27 Labs (11/20): LDL 71, HDL 43 Labs (12/20): K 4.6, creatinine 1.18 Labs (6/21): K 4.5, creatinine 1.05 Labs (7/21): K 5, creatinine 1.31 Labs (10/21): K 4.8, creatinine 1.22 Labs (11/22): K 4.8 => 5.6, creatinine 1.32 => 1.23, LDL 65 Labs (3/23): K 4.8, creatinine 1.2, LDL 79 Labs (10/23): K 4.2, creatinine 1.16   Past Medical History 1. Chronic systolic CHF: ECHO 11/29/9890 EF 25-30%. Ischemic cardiomyopathy, now s/p CABG.  - Echo (4/19): EF 30-35%, wall motion abnormalities, grade 2 diastolic dysfunction, normal RV size and systolic function.   - Medtronic ICD.  - Echo (5/20): EF 20-25%, normal RV size and systolic function.  - Echo (6/21): EF 30-35%, global HK, normal RV size and systolic function. - Echo (3/23): EF 30-35%, mildly decreased RV systolic function 2. CAD: Strong FH premature CAD. NSTEMI 1/19, now s/p CABG 03/2017 3. Atrial fibrillation: Paroxysmal. Noted only in setting of NSTEMI.  4. Smoking: Has quit.  5. Carotid stenosis: Carotid dopplers (11/19) with 40-59% BICA stenosis.  - Carotid dopplers (11/20): 40-59% RICA - Carotid dopplers (10/21): 40-59% BICA - Carotid dopplers (10/22): 40-59% BICA - Carotid dopplers (11/23): 40-59% BICA  Current Outpatient Medications  Medication Sig Dispense Refill   aspirin EC 81 MG EC tablet Take 1 tablet (81 mg total) by mouth daily.     atorvastatin (LIPITOR) 80 MG tablet TAKE 1 TABLET BY MOUTH DAILY 30 tablet 6   carvedilol (COREG) 25 MG tablet  TAKE 1 TABLET(25 MG) BY MOUTH TWICE DAILY WITH A MEAL 180 tablet 3   ENTRESTO 97-103 MG TAKE 1 TABLET BY MOUTH TWICE DAILY 60 tablet 2   JARDIANCE 10 MG TABS tablet TAKE 1 TABLET(10 MG) BY MOUTH DAILY BEFORE BREAKFAST 30 tablet 3   sildenafil (VIAGRA) 50 MG tablet TAKE 1 TABLET BY MOUTH AS NEEDED FOR ERECTILE DYSFUNCTION 30 tablet 1   sodium zirconium cyclosilicate (LOKELMA) 10 g PACK packet MIX AND DRINK 1 PACKET BY MOUTH EVERY DAY 30 each 4   spironolactone  (ALDACTONE) 25 MG tablet Take 0.5 tablets (12.5 mg total) by mouth daily. 90 tablet 3   No current facility-administered medications for this encounter.   Allergies:   Patient has no known allergies.   Social History:  The patient  reports that he has quit smoking. His smoking use included cigarettes. He has a 30.00 pack-year smoking history. He has never used smokeless tobacco. He reports that he does not drink alcohol and does not use drugs.   Family History:  The patient's family history includes Cirrhosis in his mother; Heart attack in his sister; Myocarditis in his brother.   ROS:  Please see the history of present illness.   All other systems are personally reviewed and negative.   BP 102/62   Pulse 63   Wt 108.1 kg (238 lb 6.4 oz)   SpO2 97%   BMI 34.21 kg/m   Exam:   General:  NAD. No resp difficulty, walked into clinic HEENT: Normal Neck: Supple. No JVD. Carotids 2+ bilat; no bruits. No lymphadenopathy or thryomegaly appreciated. Cor: PMI nondisplaced. Regular rate & rhythm. No rubs, gallops or murmurs. Lungs: Clear Abdomen: Soft, nontender, nondistended. No hepatosplenomegaly. No bruits or masses. Good bowel sounds. Extremities: No cyanosis, clubbing, rash, edema Neuro: Alert & oriented x 3, cranial nerves grossly intact. Moves all 4 extremities w/o difficulty. Affect pleasant.  Recent Labs: 12/23/2021: BUN 17; Creatinine, Ser 1.16; Potassium 4.2; Sodium 139  Personally reviewed   Wt Readings from Last 3 Encounters:  05/30/22 108.1 kg (238 lb 6.4 oz)  04/28/22 105.7 kg (233 lb)  12/23/21 107 kg (236 lb)    ASSESSMENT AND PLAN: 1. Chronic systolic CHF: Echo 2/29 EF 25-30%. Ischemic cardiomyopathy, now s/p CABG as below. Repeat echo in 4/19 with EF still low at 30-35% and echo in 5/20 showed EF 20-25%.  Echo in 6/21 showed EF 30-35%.  Echo in 3/23 showed EF 30-35%. Medtronic ICD.  NYHA class I-II symptoms. He is not volume overloaded on exam or by Optivol.  - He does  not need loop diuretic. - Continue Coreg 25 mg bid.    - Continue Entresto 97/103 bid. BMET today.  - Continue spironolactone 12.5 mg daily.  - Continue Lokelma 10 g daily to allow him to continue spironolactone.  - Continue empagliflozin 10 mg daily.  No GU symptoms. 2. CAD: Strong FH premature CAD.  NSTEMI, now s/p CABG x 5 03/31/2017.  No chest pain.   - Continue ASA 81 and statin. Check lipids today. 3. Atrial fibrillation: Paroxysmal. Only noted at initial admission in setting of severe CP/NSTEMI.  No palpitations. No recent atrial fibrillation by device interrogation. NSR on ECG today. - If recurs, he will need to be anticoagulated (holding off for now). 4. Smoking: Has not smoked since 1/19.  5. Carotid bruit: Moderate BICA stenosis in 10/22 - Carotid dopplers (11/23) moderate BICA stenosis, repeat study in 1 year (11/24).  Follow up in 4 months with  Dr. Aundra Durham.  Signed, Rafael Bihari, FNP  05/30/2022  [facility] [address] Heart and [place] [address] [phone] (office) [phone] (fax)

## 2022-05-30 ENCOUNTER — Ambulatory Visit (HOSPITAL_COMMUNITY)
Admission: RE | Admit: 2022-05-30 | Discharge: 2022-05-30 | Disposition: A | Discharge status: Home / Self Care | Payer: 59 | Source: Ambulatory Visit | Attending: Family Medicine | Admitting: Family Medicine

## 2022-05-30 ENCOUNTER — Ambulatory Visit: Payer: 59

## 2022-05-30 ENCOUNTER — Encounter (HOSPITAL_COMMUNITY): Payer: Self-pay

## 2022-05-30 VITALS — BP 102/62 | HR 63 | Wt 238.4 lb

## 2022-05-30 DIAGNOSIS — Z8249 Family history of ischemic heart disease and other diseases of the circulatory system: Secondary | ICD-10-CM | POA: Insufficient documentation

## 2022-05-30 DIAGNOSIS — Z7984 Long term (current) use of oral hypoglycemic drugs: Secondary | ICD-10-CM | POA: Diagnosis not present

## 2022-05-30 DIAGNOSIS — I255 Ischemic cardiomyopathy: Secondary | ICD-10-CM

## 2022-05-30 DIAGNOSIS — Z79899 Other long term (current) drug therapy: Secondary | ICD-10-CM | POA: Insufficient documentation

## 2022-05-30 DIAGNOSIS — I252 Old myocardial infarction: Secondary | ICD-10-CM | POA: Diagnosis not present

## 2022-05-30 DIAGNOSIS — Z87891 Personal history of nicotine dependence: Secondary | ICD-10-CM | POA: Insufficient documentation

## 2022-05-30 DIAGNOSIS — I480 Paroxysmal atrial fibrillation: Secondary | ICD-10-CM | POA: Insufficient documentation

## 2022-05-30 DIAGNOSIS — I5022 Chronic systolic (congestive) heart failure: Secondary | ICD-10-CM | POA: Diagnosis present

## 2022-05-30 DIAGNOSIS — I6523 Occlusion and stenosis of bilateral carotid arteries: Secondary | ICD-10-CM | POA: Diagnosis not present

## 2022-05-30 DIAGNOSIS — Z951 Presence of aortocoronary bypass graft: Secondary | ICD-10-CM | POA: Insufficient documentation

## 2022-05-30 DIAGNOSIS — I2510 Atherosclerotic heart disease of native coronary artery without angina pectoris: Secondary | ICD-10-CM | POA: Insufficient documentation

## 2022-05-30 LAB — CUP PACEART REMOTE DEVICE CHECK
Battery Remaining Longevity: 52 mo
Battery Voltage: 2.98 V
Brady Statistic RV Percent Paced: 0.01 %
Date Time Interrogation Session: 20240308152756
HighPow Impedance: 84 Ohm
Implantable Lead Connection Status: 753985
Implantable Lead Implant Date: 20190502
Implantable Lead Location: 753860
Implantable Pulse Generator Implant Date: 20190502
Lead Channel Impedance Value: 1558 Ohm
Lead Channel Impedance Value: 1862 Ohm
Lead Channel Pacing Threshold Amplitude: 2.5 V
Lead Channel Pacing Threshold Pulse Width: 0.4 ms
Lead Channel Sensing Intrinsic Amplitude: 11 mV
Lead Channel Sensing Intrinsic Amplitude: 11 mV
Lead Channel Setting Pacing Amplitude: 4 V
Lead Channel Setting Pacing Pulse Width: 0.7 ms
Lead Channel Setting Sensing Sensitivity: 0.3 mV
Zone Setting Status: 755011
Zone Setting Status: 755011

## 2022-05-30 LAB — BASIC METABOLIC PANEL
Anion gap: 13 (ref 5–15)
BUN: 13 mg/dL (ref 8–23)
CO2: 20 mmol/L — ABNORMAL LOW (ref 22–32)
Calcium: 8.6 mg/dL — ABNORMAL LOW (ref 8.9–10.3)
Chloride: 104 mmol/L (ref 98–111)
Creatinine, Ser: 0.87 mg/dL (ref 0.61–1.24)
GFR, Estimated: >60 mL/min (ref >60)
Glucose, Bld: 198 mg/dL — ABNORMAL HIGH (ref 70–99)
Potassium: 3.8 mmol/L (ref 3.5–5.1)
Sodium: 137 mmol/L (ref 135–145)

## 2022-05-30 LAB — LIPID PANEL
Cholesterol: 142 mg/dL (ref 0–200)
HDL: 42 mg/dL (ref >40)
LDL Cholesterol: 61 mg/dL (ref 0–99)
Total CHOL/HDL Ratio: 3.4 RATIO
Triglycerides: 197 mg/dL — ABNORMAL HIGH (ref <150)
VLDL: 39 mg/dL (ref 0–40)

## 2022-05-30 NOTE — Patient Instructions (Addendum)
Thank you for coming in today  Labs were done today, if any labs are abnormal the clinic will call you No news is good news  Medications: No changes  Follow up appointments:  Your physician recommends that you schedule a follow-up appointment in:  4 months with Dr. Kendall Flack will receive a reminder letter in the mail a few months in advance. If you don't receive a letter, please call our office to schedule the follow-up appointment.    Do the following things EVERYDAY: Weigh yourself in the morning before breakfast. Write it down and keep it in a log. Take your medicines as prescribed Eat low salt foods--Limit salt (sodium) to 2000 mg per day.  Stay as active as you can everyday Limit all fluids for the day to less than 2 liters   At the [facility], you and your health needs are our priority. As part of our continuing mission to provide you with exceptional heart care, we have created designated Provider Care Teams. These Care Teams include your primary Cardiologist (physician) and Advanced Practice Providers (APPs- Physician Assistants and Nurse Practitioners) who all work together to provide you with the care you need, when you need it.   You may see any of the following providers on your designated Care Team at your next follow up: Dr Glori Bickers Dr Loralie Champagne Dr. Roxana Hires, NP Lyda Jester, [place] [facility] [place], [place] Forestine Na, NP Audry Riles, PharmD   Please be sure to bring in all your medications bottles to every appointment.    Thank you for choosing [facility]  If you have any questions or concerns before your next appointment please send [place] a message through Horace or call our office at [phone].    TO LEAVE A MESSAGE FOR THE NURSE SELECT OPTION 2, PLEASE LEAVE A MESSAGE INCLUDING: YOUR NAME DATE OF BIRTH CALL BACK NUMBER REASON FOR CALL**this is  important as we prioritize the call backs  YOU WILL RECEIVE A CALL BACK THE SAME DAY AS LONG AS YOU CALL BEFORE 4:00 PM

## 2022-05-30 NOTE — Addendum Note (Signed)
Encounter addended by: Payton Mccallum, RN on: 05/30/2022 2:55 PM  Actions taken: Clinical Note Signed

## 2022-06-06 ENCOUNTER — Other Ambulatory Visit (HOSPITAL_COMMUNITY): Payer: Self-pay

## 2022-06-06 MED ORDER — ATORVASTATIN CALCIUM 80 MG PO TABS
ORAL_TABLET | ORAL | 6 refills | Status: DC
Start: 2022-06-06 — End: 2023-01-01

## 2022-06-30 NOTE — Progress Notes (Signed)
Remote ICD transmission.   

## 2022-07-06 ENCOUNTER — Other Ambulatory Visit (HOSPITAL_COMMUNITY): Payer: Self-pay | Admitting: Cardiology

## 2022-08-29 ENCOUNTER — Ambulatory Visit (INDEPENDENT_AMBULATORY_CARE_PROVIDER_SITE_OTHER): Payer: 59

## 2022-08-29 DIAGNOSIS — I255 Ischemic cardiomyopathy: Secondary | ICD-10-CM | POA: Diagnosis not present

## 2022-08-29 LAB — CUP PACEART REMOTE DEVICE CHECK
Battery Remaining Longevity: 49 mo
Battery Voltage: 2.98 V
Brady Statistic RV Percent Paced: 0.01 %
Date Time Interrogation Session: 20240607142823
HighPow Impedance: 88 Ohm
Implantable Lead Connection Status: 753985
Implantable Lead Implant Date: 20190502
Implantable Lead Location: 753860
Implantable Pulse Generator Implant Date: 20190502
Lead Channel Impedance Value: 1634 Ohm
Lead Channel Impedance Value: 1938 Ohm
Lead Channel Pacing Threshold Amplitude: 2 V
Lead Channel Pacing Threshold Pulse Width: 0.4 ms
Lead Channel Sensing Intrinsic Amplitude: 14.75 mV
Lead Channel Sensing Intrinsic Amplitude: 14.75 mV
Lead Channel Setting Pacing Amplitude: 4 V
Lead Channel Setting Pacing Pulse Width: 0.7 ms
Lead Channel Setting Sensing Sensitivity: 0.3 mV
Zone Setting Status: 755011
Zone Setting Status: 755011

## 2022-09-16 NOTE — Progress Notes (Signed)
Remote ICD transmission.   

## 2022-09-28 ENCOUNTER — Other Ambulatory Visit (HOSPITAL_COMMUNITY): Payer: Self-pay | Admitting: Cardiology

## 2022-10-06 ENCOUNTER — Other Ambulatory Visit (HOSPITAL_COMMUNITY): Payer: Self-pay | Admitting: Cardiology

## 2022-10-13 ENCOUNTER — Encounter (HOSPITAL_COMMUNITY): Payer: Self-pay | Admitting: Cardiology

## 2022-10-13 ENCOUNTER — Ambulatory Visit (HOSPITAL_COMMUNITY)
Admission: RE | Admit: 2022-10-13 | Discharge: 2022-10-13 | Disposition: A | Discharge status: Home / Self Care | Payer: 59 | Source: Ambulatory Visit | Attending: Cardiology | Admitting: Cardiology

## 2022-10-13 VITALS — BP 112/60 | HR 61 | Wt 240.0 lb

## 2022-10-13 DIAGNOSIS — Z8249 Family history of ischemic heart disease and other diseases of the circulatory system: Secondary | ICD-10-CM | POA: Diagnosis not present

## 2022-10-13 DIAGNOSIS — Z951 Presence of aortocoronary bypass graft: Secondary | ICD-10-CM | POA: Insufficient documentation

## 2022-10-13 DIAGNOSIS — I2510 Atherosclerotic heart disease of native coronary artery without angina pectoris: Secondary | ICD-10-CM | POA: Insufficient documentation

## 2022-10-13 DIAGNOSIS — I480 Paroxysmal atrial fibrillation: Secondary | ICD-10-CM | POA: Insufficient documentation

## 2022-10-13 DIAGNOSIS — Z79899 Other long term (current) drug therapy: Secondary | ICD-10-CM | POA: Diagnosis not present

## 2022-10-13 DIAGNOSIS — I255 Ischemic cardiomyopathy: Secondary | ICD-10-CM | POA: Diagnosis not present

## 2022-10-13 DIAGNOSIS — Z87891 Personal history of nicotine dependence: Secondary | ICD-10-CM | POA: Insufficient documentation

## 2022-10-13 DIAGNOSIS — Z7984 Long term (current) use of oral hypoglycemic drugs: Secondary | ICD-10-CM | POA: Insufficient documentation

## 2022-10-13 DIAGNOSIS — R0989 Other specified symptoms and signs involving the circulatory and respiratory systems: Secondary | ICD-10-CM | POA: Insufficient documentation

## 2022-10-13 DIAGNOSIS — I252 Old myocardial infarction: Secondary | ICD-10-CM | POA: Insufficient documentation

## 2022-10-13 DIAGNOSIS — I5022 Chronic systolic (congestive) heart failure: Secondary | ICD-10-CM | POA: Insufficient documentation

## 2022-10-13 LAB — BASIC METABOLIC PANEL
Anion gap: 8 (ref 5–15)
BUN: 19 mg/dL (ref 8–23)
CO2: 23 mmol/L (ref 22–32)
Calcium: 9.2 mg/dL (ref 8.9–10.3)
Chloride: 104 mmol/L (ref 98–111)
Creatinine, Ser: 1.08 mg/dL (ref 0.61–1.24)
GFR, Estimated: >60 mL/min (ref >60)
Glucose, Bld: 231 mg/dL — ABNORMAL HIGH (ref 70–99)
Potassium: 4.6 mmol/L (ref 3.5–5.1)
Sodium: 135 mmol/L (ref 135–145)

## 2022-10-13 LAB — BRAIN NATRIURETIC PEPTIDE: B Natriuretic Peptide: 250 pg/mL — ABNORMAL HIGH (ref 0.0–100.0)

## 2022-10-13 NOTE — Progress Notes (Signed)
Date:  10/13/2022   ID:  [name], DOB 01-12-1956, MRN [id number]   Provider location: [place] [facility] Type of Visit: Established patient  PCP:  Shelle Iron, MD  HF Cardiologist:  Dr. Shirlee Latch   HPI: Ryan Durham is a 67 y.o. male who has a history of CAD s/p CABG x 5 03/2017, systolic CHF 2/2 ICM, Afib in setting of STEMI, and tobacco abuse.    Admitted 1/6 -> 04/09/17 with NSTEMI with sudden onset of chest tightness and SOB. Noted to be in rapid Afib on arrival. He spontaneously converted to NSR with treatment of his chest pain. Underwent LHC that showed critical 3vD. IABP placed with cardiogenic shock and TCTS consulted for CABG. Pt underwent CABG 03/31/2017 with no immediate complications. Pressors weaned and extubated as tolerated. Weaned off IABP POD #1. Pt diuresed with IV lasix and HF medications adjusted as tolerated.  Discharge weight 217 lbs.    Medtronic ICD placed in 5/19. Echo in 5/20 with EF 20-25%.  Echo in 6/21 with EF 30-35%, global HK, normal RV size and systolic function.  Echo in 3/23 showed EF 30-35%, mildly decreased RV systolic function.    Today he returns for HF follow up. Still working at [employer]. Main complaint is hip pain with walking.  No exertional dyspnea. No chest pain.  Rare lightheadedness.  Weight up 2 lbs. No palpitations.   ECG (personally reviewed): NSR, LAFB, old ASMI  Medtronic device interrogation: No VT/AF, stable thoracic impedance  Labs (3/19): LDL 56, HDL 26, K 4.3, creatinine 4.09 Labs (5/19): K 5, creatinine 0.94  Labs (8/19): K 4.7, creatinine 1.05 Labs (11/19): K 4.1, creatinine 0.9 Labs (2/20): LDL 59, HDl 44, K 4.2, ceatinine 1.09 Labs (4/20): hgb 15.3, K 5, creatinine 1.09, LDL 61 Labs (8/20): K 4.9, creatinine 1.27 Labs (11/20): LDL 71, HDL 43 Labs (12/20): K 4.6, creatinine 1.18 Labs (6/21): K 4.5, creatinine 1.05 Labs (7/21): K 5, creatinine 1.31 Labs (10/21): K 4.8, creatinine  1.22 Labs (11/22): K 4.8 => 5.6, creatinine 1.32 => 1.23, LDL 65 Labs (3/23): K 4.8, creatinine 1.2, LDL 79 Labs (10/23): K 4.2, creatinine 1.16 Labs (3/24): LDL 61, K 3.8, creatinine 8.11   Past Medical History 1. Chronic systolic CHF: ECHO 03/30/2017 EF 25-30%. Ischemic cardiomyopathy, now s/p CABG.  - Echo (4/19): EF 30-35%, wall motion abnormalities, grade 2 diastolic dysfunction, normal RV size and systolic function.   - Medtronic ICD.  - Echo (5/20): EF 20-25%, normal RV size and systolic function.  - Echo (6/21): EF 30-35%, global HK, normal RV size and systolic function. - Echo (3/23): EF 30-35%, mildly decreased RV systolic function 2. CAD: Strong FH premature CAD. NSTEMI 1/19, now s/p CABG 03/2017 3. Atrial fibrillation: Paroxysmal. Noted only in setting of NSTEMI.  4. Smoking: Has quit.  5. Carotid stenosis: Carotid dopplers (11/19) with 40-59% BICA stenosis.  - Carotid dopplers (11/20): 40-59% RICA - Carotid dopplers (10/21): 40-59% BICA - Carotid dopplers (10/22): 40-59% BICA - Carotid dopplers (11/23): 40-59% BICA  Current Outpatient Medications  Medication Sig Dispense Refill   aspirin EC 81 MG EC tablet Take 1 tablet (81 mg total) by mouth daily.     atorvastatin (LIPITOR) 80 MG tablet TAKE 1 TABLET BY MOUTH DAILY 30 tablet 6   carvedilol (COREG) 25 MG tablet TAKE 1 TABLET(25 MG) BY MOUTH TWICE DAILY WITH A MEAL 180 tablet 3   JARDIANCE 10 MG TABS tablet TAKE 1 TABLET(10 MG) BY  MOUTH DAILY BEFORE BREAKFAST 30 tablet 3   sacubitril-valsartan (ENTRESTO) 97-103 MG TAKE 1 TABLET BY MOUTH TWICE DAILY 60 tablet 3   sildenafil (VIAGRA) 50 MG tablet TAKE 1 TABLET BY MOUTH AS NEEDED FOR ERECTILE DYSFUNCTION 30 tablet 1   sodium zirconium cyclosilicate (LOKELMA) 10 g PACK packet MIX AND DRINK 1 PACKET BY MOUTH EVERY DAY 30 each 2   spironolactone (ALDACTONE) 25 MG tablet Take 0.5 tablets (12.5 mg total) by mouth daily. 90 tablet 3   No current facility-administered medications for  this encounter.   Allergies:   Patient has no known allergies.   Social History:  The patient  reports that he has quit smoking. His smoking use included cigarettes. He has a 30 pack-year smoking history. He has never used smokeless tobacco. He reports that he does not drink alcohol and does not use drugs.   Family History:  The patient's family history includes Cirrhosis in his mother; Heart attack in his sister; Myocarditis in his brother.   ROS:  Please see the history of present illness.   All other systems are personally reviewed and negative.   BP 112/60   Pulse 61   Wt 108.9 kg (240 lb)   SpO2 95%   BMI 34.44 kg/m   Exam:   General: NAD Neck: No JVD, no thyromegaly or thyroid nodule.  Lungs: Clear to auscultation bilaterally with normal respiratory effort. CV: Nondisplaced PMI.  Heart regular S1/S2, no S3/S4, no murmur.  No peripheral edema.  No carotid bruit.  Normal pedal pulses.  Abdomen: Soft, nontender, no hepatosplenomegaly, no distention.  Skin: Intact without lesions or rashes.  Neurologic: Alert and oriented x 3.  Psych: Normal affect. Extremities: No clubbing or cyanosis.  HEENT: Normal.   Wt Readings from Last 3 Encounters:  10/13/22 108.9 kg (240 lb)  05/30/22 108.1 kg (238 lb 6.4 oz)  04/28/22 105.7 kg (233 lb)    ASSESSMENT AND PLAN: 1. Chronic systolic CHF: Echo 1/19 EF 25-30%. Ischemic cardiomyopathy, now s/p CABG as below. Repeat echo in 4/19 with EF still low at 30-35% and echo in 5/20 showed EF 20-25%.  Echo in 6/21 showed EF 30-35%.  Echo in 3/23 showed EF 30-35%. Medtronic ICD.  NYHA class I-II symptoms. He is not volume overloaded on exam or by Optivol.  - He does not need loop diuretic. - Continue Coreg 25 mg bid.    - Continue Entresto 97/103 bid. BMET today.  - Continue spironolactone 12.5 mg daily, will not increase with history of hyperkalemia.  - Continue Lokelma 10 g daily to allow him to continue spironolactone.  - Continue empagliflozin  10 mg daily. - Repeat echo at followup in 4 months.  2. CAD: Strong FH premature CAD.  NSTEMI, now s/p CABG x 5 03/31/2017.  No chest pain.   - Continue ASA 81 and statin. Good lipids in 3/24. 3. Atrial fibrillation: Paroxysmal. Only noted at initial admission in setting of severe CP/NSTEMI.  No palpitations. No recent atrial fibrillation by device interrogation. NSR on ECG today. - If recurs, he will need to be anticoagulated.  4. Smoking: Has not smoked since 1/19.  5. Carotid bruit: Moderate BICA stenosis in 11/23 - Repeat carotid dopplers in 11/24.   Follow up in 4 months with echo.  Signed, Marca Ancona, MD  10/13/2022  [facility] [address] [facility] [place] [address] [phone] (office) [phone] (fax)

## 2022-10-13 NOTE — Patient Instructions (Signed)
Medication Changes:  No Changes In Medications at this time.   Lab Work:  Labs done today, your results will be available in MyChart, we will contact you for abnormal readings.   Testing/Procedures:  Your physician has requested that you have a carotid duplex IN NOVEMBER. This test is an ultrasound of the carotid arteries in your neck. It looks at blood flow through these arteries that supply the brain with blood. Allow one hour for this exam. There are no restrictions or special instructions.  Your physician has requested that you have an echocardiogram IN 4 MONTHS. Echocardiography is a painless test that uses sound waves to create images of your heart. It provides your doctor with information about the size and shape of your heart and how well your heart's chambers and valves are working. You may receive an ultrasound enhancing agent through an IV if needed to better visualize your heart during the echo.This procedure takes approximately one hour. There are no restrictions for this procedure.   Follow-Up in: 4 MONTHS WITH DR. Shirlee Latch PLEASE CALL OUR OFFICE AROUND SEPTEMBER TO GET SCHEDULED FOR YOUR APPOINTMENT. PHONE NUMBER IS [phone] OPTION 2    At the [facility], you and your health needs are our priority. We have a designated team specialized in the treatment of Heart Failure. This Care Team includes your primary Heart Failure Specialized Cardiologist (physician), Advanced Practice Providers (APPs- Physician Assistants and Nurse Practitioners), and Pharmacist who all work together to provide you with the care you need, when you need it.   You may see any of the following providers on your designated Care Team at your next follow up:  Dr. Arvilla Meres Dr. Marca Ancona Dr. Marcos Eke, NP Robbie Lis, [place] [facility] [place], [place] Brynda Peon, NP Karle Plumber, PharmD   Please be sure to bring in all your medications bottles  to every appointment.   Need to Contact us:  If you have any questions or concerns before your next appointment please send [place] a message through Faith or call our office at [phone].    TO LEAVE A MESSAGE FOR THE NURSE SELECT OPTION 2, PLEASE LEAVE A MESSAGE INCLUDING: YOUR NAME DATE OF BIRTH CALL BACK NUMBER REASON FOR CALL**this is important as we prioritize the call backs  YOU WILL RECEIVE A CALL BACK THE SAME DAY AS LONG AS YOU CALL BEFORE 4:00 PM

## 2022-11-05 ENCOUNTER — Other Ambulatory Visit (HOSPITAL_COMMUNITY): Payer: Self-pay | Admitting: Cardiology

## 2022-11-10 ENCOUNTER — Telehealth: Payer: Self-pay | Admitting: Cardiology

## 2022-11-10 NOTE — Telephone Encounter (Signed)
Patient coming in today saying that his device goes off every 4-5 hours and sounds like a smoke alarm. He would like to know why. Please call [phone].

## 2022-11-10 NOTE — Telephone Encounter (Signed)
See other phone note, spoke with his wife (okay per DPR).

## 2022-11-10 NOTE — Telephone Encounter (Signed)
  1. Has your device fired? no  2. Is you device beeping? It is making a noise  3. Are you experiencing draining or swelling at device site? no  4. Are you calling to see if we received your device transmission? no  5. Have you passed out? no   Please route to Device Clinic Pool

## 2022-11-10 NOTE — Telephone Encounter (Signed)
Device alert for RV lead impedance out of range, 2014 ohms There has been a subtle upward trend - route to triage LA, CVRS  Patient's device is alarming due to RV lead impedance out of range.  Also, have noted that patient has had known high RV thresholds per 04/28/22 OV with Dr. Elberta Fortis:  "Status post Medtronic ICD implanted 07/23/2017.  RV threshold is elevated, though other parameters are within normal limits.  Will continue to monitor."   As lead impedance is now trending out of range as well, will bring patient in tomorrow to device clinic at 10am for full interrogation and to turn off alert.  Dr. Elberta Fortis is in the office as well and can review.   Patient given ER precautions if he should have any symptoms (ie: fast heart rates, palpitations, syncope/near syncope) if occurs, should call 911 and go to ER, not wait for appt tomorrow.  Spoke with wife and she verbalizes understanding of all instructions given today.   Patient VP <0.1 %.  He is not dependent.    DEFIB impedance is WNL and stable at 89ohms.

## 2022-11-11 ENCOUNTER — Ambulatory Visit: Payer: 59 | Attending: Internal Medicine

## 2022-11-11 DIAGNOSIS — I255 Ischemic cardiomyopathy: Secondary | ICD-10-CM

## 2022-11-11 LAB — CUP PACEART INCLINIC DEVICE CHECK
Date Time Interrogation Session: 20240820120429
Implantable Lead Connection Status: 753985
Implantable Lead Implant Date: 20190502
Implantable Lead Location: 753860
Implantable Pulse Generator Implant Date: 20190502

## 2022-11-11 NOTE — Progress Notes (Signed)
In clinic check due to RV impedance >2000ohms and patient alarm. Parameters reviewed with Dr. Nicholas Lose, ok to increase RV impedance alarm to >3000ohms for patient alert tone. Chronic elevated threshold. Defib impedance stable at 89-90 ohms. Patient VP<0.1% Return to OV with WC 04/2023.

## 2022-11-28 ENCOUNTER — Ambulatory Visit (INDEPENDENT_AMBULATORY_CARE_PROVIDER_SITE_OTHER): Payer: 59

## 2022-11-28 DIAGNOSIS — I255 Ischemic cardiomyopathy: Secondary | ICD-10-CM | POA: Diagnosis not present

## 2022-12-01 LAB — CUP PACEART REMOTE DEVICE CHECK
Battery Remaining Longevity: 48 mo
Battery Voltage: 2.97 V
Brady Statistic RV Percent Paced: 0.01 %
Date Time Interrogation Session: 20240906151710
HighPow Impedance: 90 Ohm
Implantable Lead Connection Status: 753985
Implantable Lead Implant Date: 20190502
Implantable Lead Location: 753860
Implantable Pulse Generator Implant Date: 20190502
Lead Channel Impedance Value: 1748 Ohm
Lead Channel Impedance Value: 2014 Ohm
Lead Channel Pacing Threshold Amplitude: 1.75 V
Lead Channel Pacing Threshold Pulse Width: 0.4 ms
Lead Channel Sensing Intrinsic Amplitude: 12.75 mV
Lead Channel Sensing Intrinsic Amplitude: 12.75 mV
Lead Channel Setting Pacing Amplitude: 4 V
Lead Channel Setting Pacing Pulse Width: 0.7 ms
Lead Channel Setting Sensing Sensitivity: 0.3 mV
Zone Setting Status: 755011
Zone Setting Status: 755011

## 2022-12-02 NOTE — Progress Notes (Signed)
Remote ICD transmission.   

## 2023-01-01 ENCOUNTER — Other Ambulatory Visit (HOSPITAL_COMMUNITY): Payer: Self-pay

## 2023-01-01 DIAGNOSIS — I5022 Chronic systolic (congestive) heart failure: Secondary | ICD-10-CM

## 2023-01-01 MED ORDER — ATORVASTATIN CALCIUM 80 MG PO TABS: ORAL_TABLET | ORAL | 6 refills | Status: DC

## 2023-02-03 ENCOUNTER — Other Ambulatory Visit (HOSPITAL_COMMUNITY): Payer: Self-pay | Admitting: Family Medicine

## 2023-02-08 ENCOUNTER — Other Ambulatory Visit (HOSPITAL_COMMUNITY): Payer: Self-pay | Admitting: Family Medicine

## 2023-02-10 ENCOUNTER — Other Ambulatory Visit (HOSPITAL_COMMUNITY): Payer: Self-pay

## 2023-02-10 DIAGNOSIS — I5022 Chronic systolic (congestive) heart failure: Secondary | ICD-10-CM

## 2023-02-10 MED ORDER — CARVEDILOL 25 MG PO TABS
25.0000 mg | ORAL_TABLET | Freq: Two times a day (BID) | ORAL | 1 refills | Status: DC
Start: 2023-02-10 — End: 2023-09-23

## 2023-02-18 ENCOUNTER — Ambulatory Visit (HOSPITAL_COMMUNITY)
Admission: RE | Admit: 2023-02-18 | Discharge: 2023-02-18 | Disposition: A | Discharge status: Home / Self Care | Payer: 59 | Source: Ambulatory Visit | Attending: Cardiology | Admitting: Cardiology

## 2023-02-18 DIAGNOSIS — I6521 Occlusion and stenosis of right carotid artery: Secondary | ICD-10-CM | POA: Diagnosis present

## 2023-02-18 DIAGNOSIS — I5022 Chronic systolic (congestive) heart failure: Secondary | ICD-10-CM | POA: Diagnosis not present

## 2023-02-27 ENCOUNTER — Ambulatory Visit (INDEPENDENT_AMBULATORY_CARE_PROVIDER_SITE_OTHER): Payer: 59

## 2023-02-27 DIAGNOSIS — I255 Ischemic cardiomyopathy: Secondary | ICD-10-CM | POA: Diagnosis not present

## 2023-02-27 LAB — CUP PACEART REMOTE DEVICE CHECK
Battery Remaining Longevity: 47 mo
Battery Voltage: 2.97 V
Brady Statistic RV Percent Paced: 0.01 %
Date Time Interrogation Session: 20241206033423
HighPow Impedance: 88 Ohm
Implantable Lead Connection Status: 753985
Implantable Lead Implant Date: 20190502
Implantable Lead Location: 753860
Implantable Pulse Generator Implant Date: 20190502
Lead Channel Impedance Value: 1862 Ohm
Lead Channel Impedance Value: 2109 Ohm
Lead Channel Pacing Threshold Amplitude: 1.875 V
Lead Channel Pacing Threshold Pulse Width: 0.4 ms
Lead Channel Sensing Intrinsic Amplitude: 15.375 mV
Lead Channel Sensing Intrinsic Amplitude: 15.375 mV
Lead Channel Setting Pacing Amplitude: 4 V
Lead Channel Setting Pacing Pulse Width: 0.7 ms
Lead Channel Setting Sensing Sensitivity: 0.3 mV
Zone Setting Status: 755011
Zone Setting Status: 755011

## 2023-03-03 ENCOUNTER — Other Ambulatory Visit (HOSPITAL_COMMUNITY): Payer: Self-pay

## 2023-03-03 MED ORDER — SPIRONOLACTONE 25 MG PO TABS: 12.5000 mg | ORAL_TABLET | Freq: Every day | ORAL | 3 refills | Status: DC

## 2023-03-06 NOTE — Progress Notes (Signed)
Date:  03/09/2023   ID:  Ryan Durham, DOB 1955-10-09, MRN [id number]   Provider location: [facility] Type of Visit: Established patient  PCP:  Shelle Iron, MD  HF Cardiologist:  Dr. Shirlee Latch   HPI: Ryan Durham is a 67 y.o. male who has a history of CAD s/p CABG x 5 03/2017, systolic CHF 2/2 ICM, Afib in setting of STEMI, and tobacco abuse.    Admitted 1/6 -> 04/09/17 with NSTEMI with sudden onset of chest tightness and SOB. Noted to be in rapid Afib on arrival. He spontaneously converted to NSR with treatment of his chest pain. Underwent LHC that showed critical 3vD. IABP placed with cardiogenic shock and TCTS consulted for CABG. Pt underwent CABG 03/31/2017 with no immediate complications. Pressors weaned and extubated as tolerated. Weaned off IABP POD #1. Pt diuresed with IV lasix and HF medications adjusted as tolerated.  Discharge weight 217 lbs.    Medtronic ICD placed in 5/19. Echo in 5/20 with EF 20-25%.  Echo in 6/21 with EF 30-35%, global HK, normal RV size and systolic function.  Echo in 3/23 showed EF 30-35%, mildly decreased RV systolic function.    Today he returns for HF follow up with wife. Overall feeling fine. Has SOB with raking leaves, but otherwise no dyspnea with work duties; works at [employer]. Has occasional positional dizziness, no falls or syncope. Denies palpitations, CP, edema, or PND/Orthopnea. Appetite ok. No fever or chills. Weight at home 238 pounds. Taking all medications.   ECG (personally reviewed): none ordered today.  Medtronic device interrogation: No VT/AF, 3.9 hr/day of activity, stable thoracic impedance  Labs (3/19): LDL 56, HDL 26, K 4.3, creatinine 4.09 Labs (5/19): K 5, creatinine 0.94  Labs (8/19): K 4.7, creatinine 1.05 Labs (11/19): K 4.1, creatinine 0.9 Labs (2/20): LDL 59, HDl 44, K 4.2, ceatinine 1.09 Labs (4/20): hgb 15.3, K 5, creatinine 1.09, LDL 61 Labs (8/20): K 4.9, creatinine 1.27 Labs  (11/20): LDL 71, HDL 43 Labs (12/20): K 4.6, creatinine 1.18 Labs (6/21): K 4.5, creatinine 1.05 Labs (7/21): K 5, creatinine 1.31 Labs (10/21): K 4.8, creatinine 1.22 Labs (11/22): K 4.8 => 5.6, creatinine 1.32 => 1.23, LDL 65 Labs (3/23): K 4.8, creatinine 1.2, LDL 79 Labs (10/23): K 4.2, creatinine 1.16 Labs (3/24): LDL 61, K 3.8, creatinine 8.11 Labs (7/24): K 4.6, creatinine 1.08   Past Medical History 1. Chronic systolic CHF: ECHO 03/30/2017 EF 25-30%. Ischemic cardiomyopathy, now s/p CABG.  - Echo (4/19): EF 30-35%, wall motion abnormalities, grade 2 diastolic dysfunction, normal RV size and systolic function.   - Medtronic ICD.  - Echo (5/20): EF 20-25%, normal RV size and systolic function.  - Echo (6/21): EF 30-35%, global HK, normal RV size and systolic function. - Echo (3/23): EF 30-35%, mildly decreased RV systolic function 2. CAD: Strong FH premature CAD. NSTEMI 1/19, now s/p CABG 03/2017 3. Atrial fibrillation: Paroxysmal. Noted only in setting of NSTEMI.  4. Smoking: Has quit.  5. Carotid stenosis: Carotid dopplers (11/19) with 40-59% BICA stenosis.  - Carotid dopplers (11/20): 40-59% RICA - Carotid dopplers (10/21): 40-59% BICA - Carotid dopplers (10/22): 40-59% BICA - Carotid dopplers (11/23): 40-59% BICA - Carotid dopplers (11/24): 40-49% RICA, 1-39% LICA  Current Outpatient Medications  Medication Sig Dispense Refill   aspirin EC 81 MG EC tablet Take 1 tablet (81 mg total) by mouth daily.     atorvastatin (LIPITOR) 80 MG tablet TAKE 1 TABLET BY MOUTH  DAILY 30 tablet 6   carvedilol (COREG) 25 MG tablet Take 1 tablet (25 mg total) by mouth 2 (two) times daily with a meal. 60 tablet 1   empagliflozin (JARDIANCE) 10 MG TABS tablet TAKE 1 TABLET(10 MG) BY MOUTH DAILY BEFORE BREAKFAST 30 tablet 11   ENTRESTO 97-103 MG TAKE 1 TABLET BY MOUTH TWICE DAILY 60 tablet 3   LOKELMA 10 g PACK packet MIX AND DRINK 1 PACKET BY MOUTH EVERY DAY 30 each 2   sildenafil (VIAGRA) 50 MG  tablet TAKE 1 TABLET BY MOUTH AS NEEDED FOR ERECTILE DYSFUNCTION 30 tablet 1   spironolactone (ALDACTONE) 25 MG tablet Take 0.5 tablets (12.5 mg total) by mouth daily. 45 tablet 3   No current facility-administered medications for this encounter.   Allergies:   Patient has no known allergies.   Social History:  The patient  reports that he has quit smoking. His smoking use included cigarettes. He has a 30 pack-year smoking history. He has never used smokeless tobacco. He reports that he does not drink alcohol and does not use drugs.   Family History:  The patient's family history includes Cirrhosis in his mother; Heart attack in his sister; Myocarditis in his brother.   ROS:  Please see the history of present illness.   All other systems are personally reviewed and negative.   BP 110/62   Pulse 61   Wt 107.9 kg (237 lb 12.8 oz)   SpO2 94%   BMI 34.12 kg/m   Wt Readings from Last 3 Encounters:  03/09/23 107.9 kg (237 lb 12.8 oz)  10/13/22 108.9 kg (240 lb)  05/30/22 108.1 kg (238 lb 6.4 oz)   PHYSICAL EXAM:  General:  NAD. No resp difficulty, walked into clinic HEENT: Normal Neck: Supple. No JVD. Carotids 2+ bilat; no bruits. No lymphadenopathy or thryomegaly appreciated. Cor: PMI nondisplaced. Regular rate & rhythm. No rubs, gallops or murmurs. Lungs: Clear Abdomen: Soft, obese, nontender, nondistended. No hepatosplenomegaly. No bruits or masses. Good bowel sounds. Extremities: No cyanosis, clubbing, rash, edema Neuro: Alert & oriented x 3, cranial nerves grossly intact. Moves all 4 extremities w/o difficulty. Affect pleasant.   ASSESSMENT/PLAN: 1. Chronic systolic CHF: Echo 1/19 EF 25-30%. Ischemic cardiomyopathy, now s/p CABG as below. Repeat echo in 4/19 with EF still low at 30-35% and echo in 5/20 showed EF 20-25%.  Echo in 6/21 showed EF 30-35%.  Echo in 3/23 showed EF 30-35%. Medtronic ICD.  NYHA class I-II symptoms. He is not volume overloaded on exam or by Optivol.  - He  does not need loop diuretic. - Continue Coreg 25 mg bid.    - Continue Entresto 97/103 bid. BMET today.  - Continue spironolactone 12.5 mg daily, will not increase with history of hyperkalemia.  - Continue Lokelma 10 g daily to allow him to continue spironolactone.  - Continue empagliflozin 10 mg daily. - Update echo. 2. CAD: Strong FH premature CAD.  NSTEMI, now s/p CABG x 5 03/31/2017.  No chest pain.   - Continue ASA 81 and statin. Good lipids in 3/24. 3. Atrial fibrillation: Paroxysmal. Only noted at initial admission in setting of severe CP/NSTEMI.  No palpitations. No recent atrial fibrillation by device interrogation. Regular on exam today. - If recurs, he will need to be anticoagulated.  4. Smoking: Has not smoked since 1/19.  5. Carotid bruit: Moderate BICA stenosis in 11/24 - Repeat carotid dopplers in 11/25.   Follow up in 4 months with Dr. Shirlee Latch.  Signed, Shanda Bumps  Lindi Adie, FNP  03/09/2023  [facility] [address] [facility] [place] [address] [phone] (office) [phone] (fax)

## 2023-03-09 ENCOUNTER — Other Ambulatory Visit (HOSPITAL_COMMUNITY): Payer: Self-pay

## 2023-03-09 ENCOUNTER — Ambulatory Visit (HOSPITAL_COMMUNITY)
Admission: RE | Admit: 2023-03-09 | Discharge: 2023-03-09 | Disposition: A | Discharge status: Home / Self Care | Payer: 59 | Source: Ambulatory Visit | Attending: Family Medicine | Admitting: Family Medicine

## 2023-03-09 ENCOUNTER — Encounter (HOSPITAL_COMMUNITY): Payer: Self-pay

## 2023-03-09 VITALS — BP 110/62 | HR 61 | Wt 237.8 lb

## 2023-03-09 DIAGNOSIS — I6523 Occlusion and stenosis of bilateral carotid arteries: Secondary | ICD-10-CM | POA: Insufficient documentation

## 2023-03-09 DIAGNOSIS — I5022 Chronic systolic (congestive) heart failure: Secondary | ICD-10-CM

## 2023-03-09 DIAGNOSIS — I480 Paroxysmal atrial fibrillation: Secondary | ICD-10-CM | POA: Diagnosis not present

## 2023-03-09 DIAGNOSIS — Z87891 Personal history of nicotine dependence: Secondary | ICD-10-CM | POA: Insufficient documentation

## 2023-03-09 DIAGNOSIS — I252 Old myocardial infarction: Secondary | ICD-10-CM | POA: Insufficient documentation

## 2023-03-09 DIAGNOSIS — Z951 Presence of aortocoronary bypass graft: Secondary | ICD-10-CM | POA: Insufficient documentation

## 2023-03-09 DIAGNOSIS — I5020 Unspecified systolic (congestive) heart failure: Secondary | ICD-10-CM | POA: Diagnosis present

## 2023-03-09 DIAGNOSIS — I2510 Atherosclerotic heart disease of native coronary artery without angina pectoris: Secondary | ICD-10-CM | POA: Insufficient documentation

## 2023-03-09 DIAGNOSIS — I255 Ischemic cardiomyopathy: Secondary | ICD-10-CM | POA: Insufficient documentation

## 2023-03-09 LAB — BASIC METABOLIC PANEL
Anion gap: 7 (ref 5–15)
BUN: 27 mg/dL — ABNORMAL HIGH (ref 8–23)
CO2: 21 mmol/L — ABNORMAL LOW (ref 22–32)
Calcium: 9.1 mg/dL (ref 8.9–10.3)
Chloride: 104 mmol/L (ref 98–111)
Creatinine, Ser: 1.4 mg/dL — ABNORMAL HIGH (ref 0.61–1.24)
GFR, Estimated: 56 mL/min — ABNORMAL LOW (ref >60)
Glucose, Bld: 198 mg/dL — ABNORMAL HIGH (ref 70–99)
Potassium: 4.7 mmol/L (ref 3.5–5.1)
Sodium: 132 mmol/L — ABNORMAL LOW (ref 135–145)

## 2023-03-09 NOTE — Addendum Note (Signed)
Encounter addended by: Demetrius Charity, RN on: 03/09/2023 10:17 AM  Actions taken: Charge Capture section accepted

## 2023-03-09 NOTE — Patient Instructions (Addendum)
Thank you for coming in today  If you had labs drawn today, any labs that are abnormal the clinic will call you No news is good news  Medications: No changes  Follow up appointments:  Your physician recommends that you schedule a follow-up appointment in:  3-4 months With Dr. Shirlee Latch   Your physician has requested that you have an echocardiogram. Echocardiography is a painless test that uses sound waves to create images of your heart. It provides your doctor with information about the size and shape of your heart and how well your heart's chambers and valves are working. This procedure takes approximately one hour. There are no restrictions for this procedure.      Do the following things EVERYDAY: Weigh yourself in the morning before breakfast. Write it down and keep it in a log. Take your medicines as prescribed Eat low salt foods--Limit salt (sodium) to 2000 mg per day.  Stay as active as you can everyday Limit all fluids for the day to less than 2 liters   At the [facility], you and your health needs are our priority. As part of our continuing mission to provide you with exceptional heart care, we have created designated Provider Care Teams. These Care Teams include your primary Cardiologist (physician) and Advanced Practice Providers (APPs- Physician Assistants and Nurse Practitioners) who all work together to provide you with the care you need, when you need it.   You may see any of the following providers on your designated Care Team at your next follow up: Dr Arvilla Meres Dr Marca Ancona Dr. Marcos Eke, NP Robbie Lis, [place] [facility], [place] Brynda Peon, NP Karle Plumber, PharmD   Please be sure to bring in all your medications bottles to every appointment.    Thank you for choosing [facility]  If you have any questions or concerns before your next appointment please  send [place] a message through [place] or call our office at [phone].    TO LEAVE A MESSAGE FOR THE NURSE SELECT OPTION 2, PLEASE LEAVE A MESSAGE INCLUDING: YOUR NAME DATE OF BIRTH CALL BACK NUMBER REASON FOR CALL**this is important as we prioritize the call backs  YOU WILL RECEIVE A CALL BACK THE SAME DAY AS LONG AS YOU CALL BEFORE 4:00 PM

## 2023-03-20 ENCOUNTER — Ambulatory Visit (HOSPITAL_COMMUNITY)
Admission: RE | Admit: 2023-03-20 | Discharge: 2023-03-20 | Disposition: A | Discharge status: Home / Self Care | Payer: 59 | Source: Ambulatory Visit | Attending: Cardiology | Admitting: Cardiology

## 2023-03-20 DIAGNOSIS — I5022 Chronic systolic (congestive) heart failure: Secondary | ICD-10-CM | POA: Diagnosis present

## 2023-03-20 LAB — BASIC METABOLIC PANEL
Anion gap: 8 (ref 5–15)
BUN: 18 mg/dL (ref 8–23)
CO2: 25 mmol/L (ref 22–32)
Calcium: 9.4 mg/dL (ref 8.9–10.3)
Chloride: 104 mmol/L (ref 98–111)
Creatinine, Ser: 1.17 mg/dL (ref 0.61–1.24)
GFR, Estimated: >60 mL/min (ref >60)
Glucose, Bld: 259 mg/dL — ABNORMAL HIGH (ref 70–99)
Potassium: 4.8 mmol/L (ref 3.5–5.1)
Sodium: 137 mmol/L (ref 135–145)

## 2023-04-01 ENCOUNTER — Other Ambulatory Visit (HOSPITAL_COMMUNITY): Payer: Self-pay

## 2023-04-01 DIAGNOSIS — I5022 Chronic systolic (congestive) heart failure: Secondary | ICD-10-CM

## 2023-04-01 MED ORDER — SILDENAFIL CITRATE 50 MG PO TABS: 50.0000 mg | ORAL_TABLET | ORAL | 5 refills | Status: AC | PRN

## 2023-04-10 ENCOUNTER — Ambulatory Visit (HOSPITAL_COMMUNITY)
Admission: RE | Admit: 2023-04-10 | Discharge: 2023-04-10 | Disposition: A | Discharge status: Home / Self Care | Payer: 59 | Source: Ambulatory Visit | Attending: Family Medicine | Admitting: Family Medicine

## 2023-04-10 ENCOUNTER — Encounter: Payer: Self-pay | Admitting: *Deleted

## 2023-04-10 DIAGNOSIS — I5022 Chronic systolic (congestive) heart failure: Secondary | ICD-10-CM | POA: Insufficient documentation

## 2023-04-10 DIAGNOSIS — Z006 Encounter for examination for normal comparison and control in clinical research program: Secondary | ICD-10-CM

## 2023-04-10 LAB — ECHOCARDIOGRAM COMPLETE
Area-P 1/2: 4.33 cm2
Calc EF: 35.7 %
S' Lateral: 5.3 cm
Single Plane A2C EF: 31.4 %
Single Plane A4C EF: 37 %

## 2023-04-10 NOTE — Research (Signed)
SITE: 050     Subject #[id number]    Subprotocol: A  Inclusion Criteria  Patients who meet all of the following criteria are eligible for enrollment as study participants:  Yes No  Age > 66 years old X   Eligible to wear Holter Study X    Exclusion Criteria  Patients who meet any of these criteria are not eligible for enrollment as study participants: Yes No  1. Receiving any mechanical (respiratory or circulatory) or renal support therapy at Screening or during Visit #1.  X  2.  Any other conditions that in the opinion of the investigators are likely to prevent compliance with the study protocol or pose a safety concern if the subject participates in the study.  X  3. Poor tolerance, namely susceptible to severe skin allergies from ECG adhesive patch application.  X   Protocol: REV H                                     Residential Zip code*  _ 6 _ (First 3 digits ONLY)                                             [employer] Informed Consent   Subject Name: Ryan Durham  Subject met inclusion and exclusion criteria.  The informed consent form, study requirements and expectations were reviewed with the subject. Subject had opportunity to read consent and questions and concerns were addressed prior to the signing of the consent form.  The subject verbalized understanding of the trial requirements.  The subject agreed to participate in the [employer] EF trial and signed the informed consent at 1520 on 04/10/2023.  The informed consent was obtained prior to performance of any protocol-specific procedures for the subject.  A copy of the signed informed consent was given to the subject and a copy was placed in the subject's medical record.   [place] [facility]          Current Outpatient Medications:    aspirin EC 81 MG EC tablet, Take 1 tablet (81 mg total) by mouth daily., Disp: , Rfl:    atorvastatin (LIPITOR) 80 MG tablet, TAKE 1 TABLET BY MOUTH DAILY, Disp: 30 tablet, Rfl: 6    carvedilol (COREG) 25 MG tablet, Take 1 tablet (25 mg total) by mouth 2 (two) times daily with a meal., Disp: 60 tablet, Rfl: 1   empagliflozin (JARDIANCE) 10 MG TABS tablet, TAKE 1 TABLET(10 MG) BY MOUTH DAILY BEFORE BREAKFAST, Disp: 30 tablet, Rfl: 11   ENTRESTO 97-103 MG, TAKE 1 TABLET BY MOUTH TWICE DAILY, Disp: 60 tablet, Rfl: 3   LOKELMA 10 g PACK packet, MIX AND DRINK 1 PACKET BY MOUTH EVERY DAY, Disp: 30 each, Rfl: 2   sildenafil (VIAGRA) 50 MG tablet, Take 1 tablet (50 mg total) by mouth as needed for erectile dysfunction., Disp: 15 tablet, Rfl: 5   spironolactone (ALDACTONE) 25 MG tablet, Take 0.5 tablets (12.5 mg total) by mouth daily., Disp: 45 tablet, Rfl: 3

## 2023-04-28 ENCOUNTER — Other Ambulatory Visit (HOSPITAL_COMMUNITY): Payer: Self-pay | Admitting: Family Medicine

## 2023-05-14 NOTE — Progress Notes (Unsigned)
Cardiology Office Note:  .   Date:  05/14/2023  ID:  Ryan Durham, DOB 1955-05-03, MRN [id number] PCP: Shelle Iron, MD  [facility] Providers Cardiologist:  None Electrophysiologist:  Will Jorja Loa, MD  Advanced Heart Failure:  Marca Ancona, MD {  History of Present Illness: Ryan Durham   Ryan Durham is a 68 y.o. male w/PMHx of CAD (CABG 2019), ICM, chronic CHF (systolic), ICD  Saw Dr. Elberta Fortis 04/28/2022, doing well, elevated RV threshold w/plans to monitor, device Visia AF >> no AF noted  Follows closely with Dr. 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 No rest SOB No near syncope or syncope No CP, palpitations No shocks   Device information MDT single chamber ICD implanted 07/23/2017 Known high RV threshold  Arrhythmia/AAD hx AFib in the setting of STEMI 2019 >>> not on [facility]  Studies Reviewed: Ryan Durham    EKG not done today  DEVICE interrogation done today and reviewed by myself Battery and lead measurements are stable RV lead impedance and threshold chronically elevated VS 100% No arrhythmias  04/10/23: TTE 1. Left ventricular ejection fraction, by estimation, is 30 to 35%. The  left ventricle has moderately decreased function. The left ventricle  demonstrates regional wall motion abnormalities (see scoring  diagram/findings for description). The left  ventricular internal cavity size was mildly dilated. Left ventricular  diastolic parameters are indeterminate.   2. Right ventricular systolic function is low normal. The right  ventricular size is normal. Tricuspid regurgitation signal is inadequate  for assessing PA pressure.   3. The mitral valve is grossly normal. No evidence of  mitral valve  regurgitation. No evidence of mitral stenosis.   4. The aortic valve is tricuspid. There is mild calcification of the  aortic valve. There is mild thickening of the aortic valve. Aortic valve  regurgitation is not visualized. Aortic valve sclerosis is present, with  no evidence of aortic valve stenosis.   Comparison(s): No significant change from prior study. EF similar to  prior, though LV measures more dilated than prior.    Echo (3/23): EF 30-35%, mildly decreased RV systolic function   TTE 09/15/2019   1. Left ventricular ejection fraction, by estimation, is 30 to 35%. The  left ventricle has moderately decreased function. The left ventricle  demonstrates global hypokinesis. Left ventricular diastolic parameters are  consistent with Grade II diastolic  dysfunction (pseudonormalization).   2. Right ventricular systolic function is normal. The right ventricular  size is normal. Tricuspid regurgitation signal is inadequate for assessing  PA pressure.   3. The mitral valve is normal in structure. No evidence of mitral valve  regurgitation. No evidence of mitral stenosis.   4. The aortic valve is tricuspid. Aortic valve regurgitation is not  visualized. Mild to moderate aortic valve sclerosis/calcification is  present, without any evidence of aortic stenosis.   5. The inferior vena cava is normal in size with greater than 50%  respiratory variability, suggesting right atrial pressure of 3 mmHg.   LHC 03/30/17 Prox RCA lesion is 80% stenosed. Prox RCA to Dist RCA lesion is 30% stenosed. Ost RPDA lesion is 99% stenosed. Post Atrio lesion is 80% stenosed. Dist LM to Ost LAD lesion  is 100% stenosed. Ost 1st Mrg lesion is 100% stenosed. Ost Cx to Prox Cx lesion is 99% stenosed. Ost 2nd Mrg to 2nd Mrg lesion is 70% stenosed.     Risk Assessment/Calculations:    Physical Exam:   VS:  There were no vitals taken for this visit.   Wt Readings from Last 3 Encounters:   03/09/23 237 lb 12.8 oz (107.9 kg)  10/13/22 240 lb (108.9 kg)  05/30/22 238 lb 6.4 oz (108.1 kg)    GEN: Well nourished, well developed in no acute distress NECK: No JVD; No carotid bruits CARDIAC: RRR, no murmurs, rubs, gallops RESPIRATORY:  CTA b/l without rales, wheezing or rhonchi  ABDOMEN: Soft, non-tender, non-distended EXTREMITIES:  No edema; No deformity   ICD site: is stable, no thinning, fluctuation, tethering  ASSESSMENT AND PLAN: .    ICD intact function no programming changes made  CAD No symptoms C/w Dr. Wynonia Musty  ICM Chronic CHF (systolic) No symptoms or exam findings of volume OL OptiVol looks great C/w Dr. Wynonia Musty  Paroxysmal Afib One known event, felt triggered by STEMI Not on OAC None noted by his device (Visia AF)     Dispo: remotes as usual, back in a year again, sooner if needed  Signed, Sheilah Pigeon, [name]

## 2023-05-15 ENCOUNTER — Encounter: Payer: Self-pay | Admitting: Physician Assistant

## 2023-05-15 ENCOUNTER — Ambulatory Visit: Payer: 59 | Attending: Physician Assistant | Admitting: Physician Assistant

## 2023-05-15 VITALS — BP 112/68 | HR 65 | Ht 70.0 in | Wt 238.8 lb

## 2023-05-15 DIAGNOSIS — I255 Ischemic cardiomyopathy: Secondary | ICD-10-CM

## 2023-05-15 DIAGNOSIS — Z95810 Presence of automatic (implantable) cardiac defibrillator: Secondary | ICD-10-CM | POA: Diagnosis not present

## 2023-05-15 DIAGNOSIS — I5022 Chronic systolic (congestive) heart failure: Secondary | ICD-10-CM | POA: Diagnosis not present

## 2023-05-15 DIAGNOSIS — I2510 Atherosclerotic heart disease of native coronary artery without angina pectoris: Secondary | ICD-10-CM

## 2023-05-15 LAB — CUP PACEART INCLINIC DEVICE CHECK
Battery Remaining Longevity: 45 mo
Battery Voltage: 2.97 V
Brady Statistic RV Percent Paced: 0.01 %
Date Time Interrogation Session: 20250221155907
HighPow Impedance: 89 Ohm
Implantable Lead Connection Status: 753985
Implantable Lead Implant Date: 20190502
Implantable Lead Location: 753860
Implantable Pulse Generator Implant Date: 20190502
Lead Channel Impedance Value: 1938 Ohm
Lead Channel Impedance Value: 2185 Ohm
Lead Channel Pacing Threshold Amplitude: 2.125 V
Lead Channel Pacing Threshold Pulse Width: 0.4 ms
Lead Channel Sensing Intrinsic Amplitude: 14.25 mV
Lead Channel Sensing Intrinsic Amplitude: 15.625 mV
Lead Channel Setting Pacing Amplitude: 4 V
Lead Channel Setting Pacing Pulse Width: 0.7 ms
Lead Channel Setting Sensing Sensitivity: 0.3 mV
Zone Setting Status: 755011
Zone Setting Status: 755011

## 2023-05-15 NOTE — Patient Instructions (Signed)
Medication Instructions:   Your physician recommends that you continue on your current medications as directed. Please refer to the Current Medication list given to you today.    *If you need a refill on your cardiac medications before your next appointment, please call your pharmacy*    Lab Work:  NONE ORDERED  TODAY\    If you have labs (blood work) drawn today and your tests are completely normal, you will receive your results only by: MyChart Message (if you have MyChart) OR A paper copy in the mail If you have any lab test that is abnormal or we need to change your treatment, we will call you to review the results.     Testing/Procedures:  NONE ORDERED  TODAY     Follow-Up: At [facility], you and your health needs are our priority.  As part of our continuing mission to provide you with exceptional heart care, we have created designated Provider Care Teams.  These Care Teams include your primary Cardiologist (physician) and Advanced Practice Providers (APPs -  Physician Assistants and Nurse Practitioners) who all work together to provide you with the care you need, when you need it.  We recommend signing up for the patient portal called "MyChart".  Sign up information is provided on this After Visit Summary.  MyChart is used to connect with patients for Virtual Visits (Telemedicine).  Patients are able to view lab/test results, encounter notes, upcoming appointments, etc.  Non-urgent messages can be sent to your provider as well.   To learn more about what you can do with MyChart, go to [email or web address].    Your next appointment:    1 year(s)  Provider:   Loman [place], MD    Other Instructions     1st Floor: - Lobby - Registration  - Pharmacy  - Lab - Cafe  2nd Floor: - PV Lab - Diagnostic Testing (echo, CT, nuclear med)  3rd Floor: - Vacant  4th Floor: - TCTS (cardiothoracic surgery) - AFib Clinic - Structural Heart Clinic -  Vascular Surgery  - Vascular Ultrasound  5th Floor: - HeartCare Cardiology (general and EP) - Clinical Pharmacy for coumadin, hypertension, lipid, weight-loss medications, and med management appointments    Valet parking services will be available as well.

## 2023-05-29 ENCOUNTER — Ambulatory Visit: Payer: 59

## 2023-05-29 DIAGNOSIS — I255 Ischemic cardiomyopathy: Secondary | ICD-10-CM

## 2023-06-01 LAB — CUP PACEART REMOTE DEVICE CHECK
Battery Remaining Longevity: 45 mo
Battery Voltage: 2.97 V
Brady Statistic RV Percent Paced: 0.03 %
Date Time Interrogation Session: 20250307172503
HighPow Impedance: 93 Ohm
Implantable Lead Connection Status: 753985
Implantable Lead Implant Date: 20190502
Implantable Lead Location: 753860
Implantable Pulse Generator Implant Date: 20190502
Lead Channel Impedance Value: 1976 Ohm
Lead Channel Impedance Value: 2204 Ohm
Lead Channel Pacing Threshold Amplitude: 2.375 V
Lead Channel Pacing Threshold Pulse Width: 0.4 ms
Lead Channel Sensing Intrinsic Amplitude: 16.25 mV
Lead Channel Sensing Intrinsic Amplitude: 16.25 mV
Lead Channel Setting Pacing Amplitude: 4 V
Lead Channel Setting Pacing Pulse Width: 0.7 ms
Lead Channel Setting Sensing Sensitivity: 0.3 mV
Zone Setting Status: 755011
Zone Setting Status: 755011

## 2023-06-05 NOTE — Addendum Note (Signed)
 Encounter addended by: Howell Rucks, RDCS on: 06/05/2023 2:36 PM  Actions taken: Imaging Exam ended

## 2023-06-16 ENCOUNTER — Other Ambulatory Visit (HOSPITAL_COMMUNITY): Payer: Self-pay

## 2023-06-16 ENCOUNTER — Telehealth (HOSPITAL_COMMUNITY): Payer: Self-pay

## 2023-06-16 NOTE — Telephone Encounter (Signed)
 Advanced Heart Failure Patient Advocate Encounter  Prior authorization for Ryan Durham has been submitted and approved. Test billing returns $0 for 30 day supply.  Key: [id number] Effective: 06/16/2023 to 06/15/2024  Burnell Blanks, CPhT Rx Patient Advocate Phone: [phone]

## 2023-06-18 ENCOUNTER — Telehealth (HOSPITAL_COMMUNITY): Payer: Self-pay

## 2023-06-18 DIAGNOSIS — I5022 Chronic systolic (congestive) heart failure: Secondary | ICD-10-CM

## 2023-06-18 MED ORDER — LOKELMA 10 G PO PACK: PACK | ORAL | 5 refills | Status: DC

## 2023-06-18 NOTE — Telephone Encounter (Signed)
 Patient's Lokelma medication has been sent to pt's pharmacy.

## 2023-06-19 ENCOUNTER — Encounter (HOSPITAL_COMMUNITY): Payer: Self-pay | Admitting: Cardiology

## 2023-06-19 ENCOUNTER — Ambulatory Visit (HOSPITAL_COMMUNITY)
Admission: RE | Admit: 2023-06-19 | Discharge: 2023-06-19 | Disposition: A | Discharge status: Home / Self Care | Payer: 59 | Source: Ambulatory Visit | Attending: Cardiology | Admitting: Cardiology

## 2023-06-19 VITALS — BP 100/60 | HR 64 | Wt 235.2 lb

## 2023-06-19 DIAGNOSIS — Z79899 Other long term (current) drug therapy: Secondary | ICD-10-CM | POA: Diagnosis not present

## 2023-06-19 DIAGNOSIS — I480 Paroxysmal atrial fibrillation: Secondary | ICD-10-CM | POA: Diagnosis not present

## 2023-06-19 DIAGNOSIS — Z95810 Presence of automatic (implantable) cardiac defibrillator: Secondary | ICD-10-CM | POA: Insufficient documentation

## 2023-06-19 DIAGNOSIS — I252 Old myocardial infarction: Secondary | ICD-10-CM | POA: Diagnosis not present

## 2023-06-19 DIAGNOSIS — I5022 Chronic systolic (congestive) heart failure: Secondary | ICD-10-CM | POA: Insufficient documentation

## 2023-06-19 DIAGNOSIS — M549 Dorsalgia, unspecified: Secondary | ICD-10-CM | POA: Diagnosis not present

## 2023-06-19 DIAGNOSIS — M25569 Pain in unspecified knee: Secondary | ICD-10-CM | POA: Insufficient documentation

## 2023-06-19 DIAGNOSIS — I2510 Atherosclerotic heart disease of native coronary artery without angina pectoris: Secondary | ICD-10-CM | POA: Insufficient documentation

## 2023-06-19 DIAGNOSIS — Z951 Presence of aortocoronary bypass graft: Secondary | ICD-10-CM | POA: Diagnosis not present

## 2023-06-19 DIAGNOSIS — Z8249 Family history of ischemic heart disease and other diseases of the circulatory system: Secondary | ICD-10-CM | POA: Diagnosis not present

## 2023-06-19 DIAGNOSIS — I255 Ischemic cardiomyopathy: Secondary | ICD-10-CM | POA: Insufficient documentation

## 2023-06-19 DIAGNOSIS — M542 Cervicalgia: Secondary | ICD-10-CM | POA: Diagnosis not present

## 2023-06-19 DIAGNOSIS — Z87891 Personal history of nicotine dependence: Secondary | ICD-10-CM | POA: Diagnosis not present

## 2023-06-19 DIAGNOSIS — R011 Cardiac murmur, unspecified: Secondary | ICD-10-CM | POA: Insufficient documentation

## 2023-06-19 LAB — BASIC METABOLIC PANEL WITH GFR
Anion gap: 9 (ref 5–15)
BUN: 18 mg/dL (ref 8–23)
CO2: 22 mmol/L (ref 22–32)
Calcium: 9 mg/dL (ref 8.9–10.3)
Chloride: 106 mmol/L (ref 98–111)
Creatinine, Ser: 1.1 mg/dL (ref 0.61–1.24)
GFR, Estimated: >60 mL/min (ref >60)
Glucose, Bld: 190 mg/dL — ABNORMAL HIGH (ref 70–99)
Potassium: 4.3 mmol/L (ref 3.5–5.1)
Sodium: 137 mmol/L (ref 135–145)

## 2023-06-19 LAB — BRAIN NATRIURETIC PEPTIDE: B Natriuretic Peptide: 179.8 pg/mL — ABNORMAL HIGH (ref 0.0–100.0)

## 2023-06-19 LAB — LIPID PANEL
Cholesterol: 154 mg/dL (ref 0–200)
HDL: 45 mg/dL (ref >40)
LDL Cholesterol: 77 mg/dL (ref 0–99)
Total CHOL/HDL Ratio: 3.4 ratio
Triglycerides: 159 mg/dL — ABNORMAL HIGH (ref <150)
VLDL: 32 mg/dL (ref 0–40)

## 2023-06-19 NOTE — Patient Instructions (Signed)
 There has been no changes to your medications.  Labs done today, your results will be available in MyChart, we will contact you for abnormal readings.  Your physician recommends that you schedule a follow-up appointment in: 4 months.  If you have any questions or concerns before your next appointment please send [place] a message through [place] or call our office at [phone].    TO LEAVE A MESSAGE FOR THE NURSE SELECT OPTION 2, PLEASE LEAVE A MESSAGE INCLUDING: YOUR NAME DATE OF BIRTH CALL BACK NUMBER REASON FOR CALL**this is important as we prioritize the call backs  YOU WILL RECEIVE A CALL BACK THE SAME DAY AS LONG AS YOU CALL BEFORE 4:00 PM  At the [facility], you and your health needs are our priority. As part of our continuing mission to provide you with exceptional heart care, we have created designated Provider Care Teams. These Care Teams include your primary Cardiologist (physician) and Advanced Practice Providers (APPs- Physician Assistants and Nurse Practitioners) who all work together to provide you with the care you need, when you need it.   You may see any of the following providers on your designated Care Team at your next follow up: Dr Arvilla Meres Dr Marca Ancona Dr. Dorthula Nettles Dr. Clearnce Hasten Amy Filbert Schilder, NP Robbie Lis, [place] [facility] [place], [place] Brynda Peon, NP [place] Lee, NP Clarisa Kindred, NP Karle Plumber, PharmD Enos Fling, PharmD   Please be sure to bring in all your medications bottles to every appointment.    Thank you for choosing [facility]

## 2023-06-19 NOTE — Progress Notes (Signed)
 Medication Samples have been provided to the patient.  Drug name: Lokelma       Strength: 10g        Qty: 3   LOT: [id number]  Exp.Date: 01/21/25  Dosing instructions: Take 1 packet a day  The patient has been instructed regarding the correct time, dose, and frequency of taking this medication, including desired effects and most common side effects.   Shahla Betsill Arminda Resides 3:47 PM 06/19/2023

## 2023-06-21 NOTE — Progress Notes (Signed)
 Date:  06/21/2023   ID:  Ryan Durham, DOB 05-Apr-1955, MRN [id number]   Provider location: [facility] Type of Visit: Established patient  PCP:  Ryan Iron, MD  HF Cardiologist:  Dr. Shirlee Latch  Chief complaint: CHF   HPI: Ryan Durham is a 68 y.o. male who has a history of CAD s/p CABG x 5 03/2017, systolic CHF 2/2 ICM, Afib in setting of STEMI, and tobacco abuse.    Admitted 1/6 -> 04/09/17 with NSTEMI with sudden onset of chest tightness and SOB. Noted to be in rapid Afib on arrival. He spontaneously converted to NSR with treatment of his chest pain. Underwent LHC that showed critical 3vD. IABP placed with cardiogenic shock and TCTS consulted for CABG. Pt underwent CABG 03/31/2017 with no immediate complications. Pressors weaned and extubated as tolerated. Weaned off IABP POD #1. Pt diuresed with IV lasix and HF medications adjusted as tolerated.  Discharge weight 217 lbs.    Medtronic ICD placed in 5/19. Echo in 5/20 with EF 20-25%.  Echo in 6/21 with EF 30-35%, global HK, normal RV size and systolic function.  Echo in 3/23 showed EF 30-35%, mildly decreased RV systolic function.  Echo in 1/25 showed EF 30-35%, mild LV dilation, low normal RV function.   Today he returns for HF follow up with wife. Weight down 2 lbs.  Main complaint is knee, back and neck pain from arthritis.  He is short of breath lifting something heavy.  Ok walking up stairs.  No chest pain.  Mild lightheadedness if he stands too fast.  No orthopnea/PND.  No palpitations.  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   ECG (personally reviewed): NSR, PVC, LAFB, old ASMI  Medtronic device interrogation: No VT/AF, stable thoracic impedance.   Labs (3/19): LDL 56, HDL 26, K 4.3, creatinine 8.65 Labs (5/19): K 5, creatinine 0.94  Labs (8/19): K 4.7, creatinine 1.05 Labs (11/19): K 4.1, creatinine 0.9 Labs (2/20): LDL 59, HDl 44, K 4.2, ceatinine 1.09 Labs (4/20): hgb 15.3, K 5,  creatinine 1.09, LDL 61 Labs (8/20): K 4.9, creatinine 1.27 Labs (11/20): LDL 71, HDL 43 Labs (12/20): K 4.6, creatinine 1.18 Labs (6/21): K 4.5, creatinine 1.05 Labs (7/21): K 5, creatinine 1.31 Labs (10/21): K 4.8, creatinine 1.22 Labs (11/22): K 4.8 => 5.6, creatinine 1.32 => 1.23, LDL 65 Labs (3/23): K 4.8, creatinine 1.2, LDL 79 Labs (10/23): K 4.2, creatinine 1.16 Labs (3/24): LDL 61, K 3.8, creatinine 7.84 Labs (7/24): K 4.6, creatinine 1.08 Labs (12/24): K 4.8, creatinine 1.17   Past Medical History 1. Chronic systolic CHF: ECHO 03/30/2017 EF 25-30%. Ischemic cardiomyopathy, now s/p CABG.  - Echo (4/19): EF 30-35%, wall motion abnormalities, grade 2 diastolic dysfunction, normal RV size and systolic function.   - Medtronic ICD.  - Echo (5/20): EF 20-25%, normal RV size and systolic function.  - Echo (6/21): EF 30-35%, global HK, normal RV size and systolic function. - Echo (3/23): EF 30-35%, mildly decreased RV systolic function - Echo (1/25): EF 30-35%, mild LV dilation, low normal RV function.  2. CAD: Strong FH premature CAD. NSTEMI 1/19, now s/p CABG 03/2017 3. Atrial fibrillation: Paroxysmal. Noted only in setting of NSTEMI.  4. Smoking: Has quit.  5. Carotid stenosis: Carotid dopplers (11/19) with 40-59% BICA stenosis.  - Carotid dopplers (11/20): 40-59% RICA - Carotid dopplers (10/21): 40-59% BICA - Carotid dopplers (10/22): 40-59% BICA - Carotid dopplers (11/23): 40-59% BICA - Carotid dopplers (  11/24): 40-49% RICA, 1-39% LICA  Current Outpatient Medications  Medication Sig Dispense Refill   aspirin EC 81 MG EC tablet Take 1 tablet (81 mg total) by mouth daily.     atorvastatin (LIPITOR) 80 MG tablet TAKE 1 TABLET BY MOUTH DAILY 30 tablet 6   carvedilol (COREG) 25 MG tablet Take 1 tablet (25 mg total) by mouth 2 (two) times daily with a meal. 60 tablet 1   empagliflozin (JARDIANCE) 10 MG TABS tablet TAKE 1 TABLET(10 MG) BY MOUTH DAILY BEFORE BREAKFAST 30 tablet 11    ENTRESTO 97-103 MG TAKE 1 TABLET BY MOUTH TWICE DAILY 60 tablet 3   sildenafil (VIAGRA) 50 MG tablet Take 1 tablet (50 mg total) by mouth as needed for erectile dysfunction. 15 tablet 5   sodium zirconium cyclosilicate (LOKELMA) 10 g PACK packet MIX AND DRINK 1 PACKET BY MOUTH EVERY DAY 30 each 5   spironolactone (ALDACTONE) 25 MG tablet Take 0.5 tablets (12.5 mg total) by mouth daily. 45 tablet 3   No current facility-administered medications for this encounter.   Allergies:   Patient has no known allergies.   Social History:  The patient  reports that he has quit smoking. His smoking use included cigarettes. He has a 30 pack-year smoking history. He has never used smokeless tobacco. He reports that he does not drink alcohol and does not use drugs.   Family History:  The patient's family history includes Cirrhosis in his mother; Heart attack in his sister; Myocarditis in his brother.   ROS:  Please see the history of present illness.   All other systems are personally reviewed and negative.   BP 100/60   Pulse 64   Wt 106.7 kg (235 lb 3.2 oz)   SpO2 94%   BMI 33.75 kg/m   Wt Readings from Last 3 Encounters:  06/19/23 106.7 kg (235 lb 3.2 oz)  05/15/23 108.3 kg (238 lb 12.8 oz)  03/09/23 107.9 kg (237 lb 12.8 oz)   PHYSICAL EXAM:  General: NAD Neck: No JVD, no thyromegaly or thyroid nodule.  Lungs: Clear to auscultation bilaterally with normal respiratory effort. CV: Nondisplaced PMI.  Heart regular S1/S2, no S3/S4, no murmur.  No peripheral edema.  No carotid bruit.  Normal pedal pulses.  Abdomen: Soft, nontender, no hepatosplenomegaly, no distention.  Skin: Intact without lesions or rashes.  Neurologic: Alert and oriented x 3.  Psych: Normal affect. Extremities: No clubbing or cyanosis.  HEENT: Normal.    ASSESSMENT/PLAN: 1. Chronic systolic CHF: Echo 1/19 EF 25-30%. Ischemic cardiomyopathy, now s/p CABG as below. Repeat echo in 4/19 with EF still low at 30-35% and echo in  5/20 showed EF 20-25%.  Echo in 6/21 showed EF 30-35%.  Echo in 3/23 showed EF 30-35%. Medtronic ICD.  Echo in 1/25 with EF 30-35%.  NYHA class II, not volume overloaded by exam or Optivol.  - He does not need loop diuretic. - Continue Coreg 25 mg bid.    - Continue Entresto 97/103 bid. BMET/BNP today.  - Continue spironolactone 12.5 mg daily, will not increase with history of hyperkalemia.  - Restart Lokelma 10 g daily, has been out. Give samples today.  - Continue empagliflozin 10 mg daily. 2. CAD: Strong FH premature CAD.  NSTEMI, now s/p CABG x 5 03/31/2017.  No chest pain.   - Continue ASA 81 and statin. Check lipids today, goal LDL < 55.  3. Atrial fibrillation: Paroxysmal. Only noted at initial admission in setting of severe CP/NSTEMI.  No palpitations. No recent atrial fibrillation by device interrogation. NSR today.  - If recurs, he will need to be anticoagulated.  4. Smoking: Has not smoked since 1/19.  5. Carotid bruit: Moderate BICA stenosis in 11/24 - Repeat carotid dopplers in 11/25.   Follow up in 4 months with APP.  I spent 32 minutes reviewing records, interviewing/examining patient, and managing order   Signed, Marca Ancona, MD  06/21/2023  [facility]: [facility] [address] [facility] [place] [address] [phone] (office) [phone] (fax)

## 2023-06-22 ENCOUNTER — Telehealth (HOSPITAL_COMMUNITY): Payer: Self-pay

## 2023-06-22 DIAGNOSIS — I5022 Chronic systolic (congestive) heart failure: Secondary | ICD-10-CM

## 2023-06-22 MED ORDER — EZETIMIBE 10 MG PO TABS: 10.0000 mg | ORAL_TABLET | Freq: Every day | ORAL | 3 refills | Status: AC

## 2023-06-22 NOTE — Telephone Encounter (Signed)
 Spoke with patient regarding the following results. Patient made aware and patient verbalized understanding.   Medication (zetia) sent to patient preferred pharmacy. Patient will like to have repeat labs done at [facility] near him in 2 months. Orders placed.   Advised patient to call back to office with any issues, questions, or concerns. Patient verbalized understanding.

## 2023-06-22 NOTE — Telephone Encounter (Signed)
-----   Message from Marca Ancona sent at 06/21/2023 11:16 PM EDT ----- LDL too high.  Add Zetia 10 mg daily with lipids in 2 months.

## 2023-06-25 ENCOUNTER — Other Ambulatory Visit (HOSPITAL_COMMUNITY): Payer: Self-pay

## 2023-06-25 DIAGNOSIS — I5022 Chronic systolic (congestive) heart failure: Secondary | ICD-10-CM

## 2023-06-25 MED ORDER — ENTRESTO 97-103 MG PO TABS: 1.0000 | ORAL_TABLET | Freq: Two times a day (BID) | ORAL | 5 refills | Status: DC

## 2023-07-02 NOTE — Addendum Note (Signed)
 Addended by: Elease Etienne A on: 07/02/2023 02:02 PM   Modules accepted: Orders

## 2023-07-02 NOTE — Progress Notes (Signed)
 Remote ICD transmission.

## 2023-07-07 ENCOUNTER — Other Ambulatory Visit (HOSPITAL_COMMUNITY): Payer: Self-pay

## 2023-07-07 DIAGNOSIS — I5022 Chronic systolic (congestive) heart failure: Secondary | ICD-10-CM

## 2023-07-07 MED ORDER — ATORVASTATIN CALCIUM 80 MG PO TABS: ORAL_TABLET | ORAL | 5 refills | Status: DC

## 2023-07-31 ENCOUNTER — Telehealth (HOSPITAL_COMMUNITY): Payer: Self-pay

## 2023-07-31 NOTE — Telephone Encounter (Signed)
  [facility]   Pre-operative Risk Assessment       Request for Surgical Clearance    Procedure:  colonoscopy   Date of Surgery:  Clearance 09/18/23                                Surgeon:  Dr. Micki Alas Surgeon's Group or Practice Name:  [facility]  Phone number:  [phone] Fax number:  [phone]  Type of Clearance Requested:   - Medical  - Pharmacy:  Hold Aspirin     Type of Anesthesia:  not listed  { Additional requests/questions:  Please fax a copy of clearance to the surgeon's office.  Signed, Caidan Hubbert B Azka Steger   07/31/2023, 11:18 AM   [facility]

## 2023-08-03 NOTE — Telephone Encounter (Signed)
 At acceptable CV risk for low risk colonoscopy. OK to hold ASA 5-7 days before procedure, resume as soon as safe to do so after colonoscopy. RCRI score 2 pts (10.1% risk of major cardiac event)  Per Camilo Cella, NP   Routed to requesting office via [employer]

## 2023-08-06 ENCOUNTER — Other Ambulatory Visit: Payer: Self-pay | Admitting: Oncology

## 2023-08-06 DIAGNOSIS — D696 Thrombocytopenia, unspecified: Secondary | ICD-10-CM

## 2023-08-06 NOTE — Progress Notes (Unsigned)
 [facility] Ryan Durham  [address] [place],  [place]  [address] [phone]  Clinic Day:  08/07/2023  Referring physician: Ardella Beaver, Ryan Durham   HISTORY OF PRESENT ILLNESS:  The patient is a 68 y.o. male  who I was asked to consult upon for thrombocytopenia.   Labs in late April 2025 showed a mildly low platelet count of 123.  One week prior, a CBC showed a low platelet count of 75. Labs dating back to 2021 have shown a low platelet count.  According to the patient, he had never been told before of having thrombocytopenia until recently.  He denies having any spontaneous bleeding or bruising issues.  He also denies having any B symptoms which are concerning for his thrombocytopenia potentially being due to an underlying hematologic malignancy.  The only new medicine that he has started recently is a cholesterol pill.  To his knowledge there is no family history of thrombocytopenia or any other blood disorders.  Overall, the patient denies there being any particular changes in his health over the past few months.  PAST MEDICAL HISTORY:   Past Medical History:  Diagnosis Date   Acute on chronic systolic heart failure (HCC) 03/29/2017   Anginal pain (HCC)    this admission   Cardiomyopathy, ischemic 03/30/2017   CHF (congestive heart failure) (HCC)    Coronary artery disease    this admission   Coronary artery disease involving native coronary artery of native heart with unstable angina pectoris (HCC) 03/29/2017   Dyspnea    this admission   Dysrhythmia    afib with this admission/runs of VTACH/Afib both with this admission   Hyperlipidemia    Myocardial infarction [facility])    this admission   Paroxysmal atrial fibrillation (HCC) 03/29/2017   Tobacco abuse    Type II diabetes mellitus (HCC) 03/30/2017   PAST SURGICAL HISTORY:   Past Surgical History:  Procedure Laterality Date   CLIPPING OF ATRIAL APPENDAGE Left 03/31/2017   Procedure: CLIPPING OF ATRIAL APPENDAGE;   Surgeon: Gardenia Jump, Ryan Durham;  Location: [facility] OR;  Service: Open Heart Surgery;  Laterality: Left;   CORONARY ANGIOPLASTY  03/30/2017   CORONARY ARTERY BYPASS GRAFT N/A 03/31/2017   Procedure: CORONARY ARTERY BYPASS GRAFTING (CABG) time 5 using bilateral saphaneous vien, harvested endoscopicly and right internal mammary artery.;  Surgeon: Gardenia Jump, Ryan Durham;  Location: [facility] OR;  Service: Open Heart Surgery;  Laterality: N/A;   CORONARY/GRAFT ACUTE MI REVASCULARIZATION N/A 03/29/2017   Procedure: Coronary/Graft Acute MI Revascularization;  Surgeon: Odie Benne, Ryan Durham;  Location: [facility] INVASIVE CV LAB;  Service: Cardiovascular;  Laterality: N/A;   IABP INSERTION N/A 03/29/2017   Procedure: IABP Insertion;  Surgeon: Odie Benne, Ryan Durham;  Location: [facility]VAS[facility] CV LAB;  Service: Cardiovascular;  Laterality: N/A;   ICD IMPLANT N/A 07/23/2017   Procedure: ICD IMPLANT;  Surgeon: Lei Pump, Ryan Durham;  Location: [facility] INVAS[facility] CV LAB;  Service: Cardiovascular;  Laterality: N/A;   LEFT HEART CATH AND CORONARY ANGIOGRAPHY N/A 03/29/2017   Procedure: LEFT HEART CATH AND CORONARY ANGIOGRAPHY;  Surgeon: Odie Benne, Ryan Durham;  Location: [facility]AS[facility] CV LAB;  Service: Cardiovascular;  Laterality: N/A;   TEE WITHOUT CARDIOVERSION N/A 03/31/2017   Procedure: TRANSESOPHAGEAL ECHOCARDIOGRAM (TEE);  Surgeon: Gardenia Jump, Ryan Durham;  Location: [facility] OR;  Service: Open Heart Surgery;  Laterality: N/A;   CURRENT MEDICATIONS:   Current Outpatient Medications  Medication Sig Dispense Refill   aspirin  EC 81 MG EC tablet  Take 1 tablet (81 mg total) by mouth daily.     atorvastatin  (LIPITOR ) 80 MG tablet TAKE 1 TABLET BY MOUTH DAILY 30 tablet 5   carvedilol  (COREG ) 25 MG tablet Take 1 tablet (25 mg total) by mouth 2 (two) times daily with a meal. 60 tablet 1   empagliflozin  (JARDIANCE ) 10 MG TABS tablet TAKE 1 TABLET(10 MG) BY MOUTH DAILY BEFORE BREAKFAST 30 tablet 11   ezetimibe  (ZETIA ) 10 MG tablet Take 1 tablet (10  mg total) by mouth daily. 90 tablet 3   sacubitril -valsartan  (ENTRESTO ) 97-103 MG Take 1 tablet by mouth 2 (two) times daily. 60 tablet 5   sildenafil  (VIAGRA ) 50 MG tablet Take 1 tablet (50 mg total) by mouth as needed for erectile dysfunction. 15 tablet 5   spironolactone  (ALDACTONE ) 25 MG tablet Take 0.5 tablets (12.5 mg total) by mouth daily. 45 tablet 3   No current facility-administered medications for this visit.   ALLERGIES:  No Known Allergies  FAMILY HISTORY:   Family History  Problem Relation Age of Onset   Cirrhosis Mother    Diabetes Mother    Other Father        DIED FROM HEAT STROKE   Heart attack Sister    Diabetes Sister    Myocarditis Brother    SOCIAL HISTORY:  The patient was born and raised in [place], [place] .  [place] his wife of 30 years.  He has 3 children and 9 grandchildren.  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  He was a social drinker in the remote past.  He did smoke 2 packs of cigarettes daily for 35 years before having to quit 6 years ago due to severe coronary artery disease.  REVIEW OF SYSTEMS:  Review of Systems  Constitutional:  Negative for fatigue, fever and unexpected weight change.  Respiratory:  Positive for cough. Negative for chest tightness, hemoptysis and shortness of breath.   Cardiovascular:  Negative for chest pain and palpitations.  Gastrointestinal:  Negative for abdominal distention, abdominal pain, blood in stool, constipation, diarrhea, nausea and vomiting.  Genitourinary:  Negative for dysuria, frequency and hematuria.   Musculoskeletal:  Positive for arthralgias. Negative for back pain and myalgias.  Skin:  Negative for itching and rash.  Neurological:  Negative for dizziness, headaches and light-headedness.  Psychiatric/Behavioral:  Negative for depression and suicidal ideas. The patient is not nervous/anxious.     PHYSICAL EXAM:  Blood pressure 106/62, pulse (!)  58, temperature 97.9 F (36.6 C), temperature source Oral, resp. rate 18, height 5\' 10"  (1.778 m), weight 230 lb 12.8 oz (104.7 kg), SpO2 97%. Wt Readings from Last 3 Encounters:  08/07/23 230 lb 12.8 oz (104.7 kg)  06/19/23 235 lb 3.2 oz (106.7 kg)  05/15/23 238 lb 12.8 oz (108.3 kg)   Body mass index is 33.12 kg/m. Performance status (ECOG): 1 - Symptomatic but completely ambulatory Physical Exam Constitutional:      Appearance: Normal appearance. He is not ill-appearing.  HENT:     Mouth/Throat:     Mouth: Mucous membranes are moist.     Pharynx: Oropharynx is clear. No oropharyngeal exudate or posterior oropharyngeal erythema.  Cardiovascular:     Rate and Rhythm: Normal rate and regular rhythm.     Heart sounds: No murmur heard.    No friction rub. No gallop.  Pulmonary:     Effort: Pulmonary effort is normal. No respiratory distress.     Breath  sounds: Normal breath sounds. No wheezing, rhonchi or rales.  Abdominal:     General: Bowel sounds are normal. There is no distension.     Palpations: Abdomen is soft. There is no mass.     Tenderness: There is no abdominal tenderness.  Musculoskeletal:        General: No swelling.     Right lower leg: No edema.     Left lower leg: No edema.  Lymphadenopathy:     Cervical: No cervical adenopathy.     Upper Body:     Right upper body: No supraclavicular or axillary adenopathy.     Left upper body: No supraclavicular or axillary adenopathy.     Lower Body: No right inguinal adenopathy. No left inguinal adenopathy.  Skin:    General: Skin is warm.     Coloration: Skin is not jaundiced.     Findings: No lesion or rash.  Neurological:     General: No focal deficit present.     Mental Status: He is alert and oriented to person, place, and time. Mental status is at baseline.  Psychiatric:        Mood and Affect: Mood normal.        Behavior: Behavior normal.        Thought Content: Thought content normal.   LABS:      Latest  Ref Rng & Units 08/07/2023    2:34 PM 06/13/2019   11:00 AM 07/21/2017   10:53 AM  CBC  WBC 4.0 - 10.5 K/uL 6.0  6.4  7.5   Hemoglobin 13.0 - 17.0 g/dL 82.9  56.2  13.0   Hematocrit 39.0 - 52.0 % 43.9  46.4  44.3   Platelets 150 - 400 K/uL 75  173  130       Latest Ref Rng & Units 08/07/2023    2:34 PM 06/19/2023    3:47 PM 03/20/2023   12:23 PM  CMP  Glucose 70 - 99 mg/dL 865  784  696   BUN 8 - 23 mg/dL 38  18  18   Creatinine 0.61 - 1.24 mg/dL 2.95  2.84  1.32   Sodium 135 - 145 mmol/L 137  137  137   Potassium 3.5 - 5.1 mmol/L 4.3  4.3  4.8   Chloride 98 - 111 mmol/L 101  106  104   CO2 22 - 32 mmol/L 21  22  25    Calcium  8.9 - 10.3 mg/dL 9.7  9.0  9.4   Total Protein 6.5 - 8.1 g/dL 7.2     Total Bilirubin 0.0 - 1.2 mg/dL 0.8     Alkaline Phos 38 - 126 U/L 89     AST 15 - 41 U/L 53     ALT 0 - 44 U/L 65      ASSESSMENT & PLAN:  A 68 y.o. male who I was asked to consult upon for thrombocytopenia.  In clinic today, I will check his vitamin B12 and folate levels to ensure a nutritional deficiency is not factoring into his thrombocytopenia.  His TSH level will be checked to ensure severe thyroid disease is not factoring into his thrombocytopenia.  His CMP today does show him with elevated liver enzymes.  This concerns me for him possibly having underlying liver disease that could be factoring into his thrombocytopenia.  For completeness, I will have him undergo CT scans of his abdomen/pelvis in the forthcoming weeks to better visualize his  liver and splenic architecture.  Of note, the patient states his mother died from cirrhosis of unknown etiology.  I will see him back in 3 weeks to go over all his labs and scans, which will be used to formulate his next course of action as it pertains to his thrombocytopenia management.  The patient understands all the plans discussed today and is in agreement with them.  I do appreciate Sistasis, Rowena, Ryan Durham for his new consult.   Lachlan Mckim Felicia Horde,  Ryan Durham

## 2023-08-07 ENCOUNTER — Inpatient Hospital Stay

## 2023-08-07 ENCOUNTER — Encounter: Payer: Self-pay | Admitting: Oncology

## 2023-08-07 ENCOUNTER — Telehealth: Payer: Self-pay | Admitting: Oncology

## 2023-08-07 ENCOUNTER — Inpatient Hospital Stay: Attending: Oncology | Admitting: Oncology

## 2023-08-07 ENCOUNTER — Other Ambulatory Visit: Payer: Self-pay | Admitting: Oncology

## 2023-08-07 VITALS — BP 106/62 | HR 58 | Temp 97.9°F | Resp 18 | Ht 70.0 in | Wt 230.8 lb

## 2023-08-07 DIAGNOSIS — I252 Old myocardial infarction: Secondary | ICD-10-CM | POA: Insufficient documentation

## 2023-08-07 DIAGNOSIS — Z8249 Family history of ischemic heart disease and other diseases of the circulatory system: Secondary | ICD-10-CM | POA: Insufficient documentation

## 2023-08-07 DIAGNOSIS — Z79899 Other long term (current) drug therapy: Secondary | ICD-10-CM | POA: Insufficient documentation

## 2023-08-07 DIAGNOSIS — Z8379 Family history of other diseases of the digestive system: Secondary | ICD-10-CM | POA: Insufficient documentation

## 2023-08-07 DIAGNOSIS — D696 Thrombocytopenia, unspecified: Secondary | ICD-10-CM

## 2023-08-07 DIAGNOSIS — Z833 Family history of diabetes mellitus: Secondary | ICD-10-CM | POA: Insufficient documentation

## 2023-08-07 DIAGNOSIS — I480 Paroxysmal atrial fibrillation: Secondary | ICD-10-CM | POA: Insufficient documentation

## 2023-08-07 DIAGNOSIS — R748 Abnormal levels of other serum enzymes: Secondary | ICD-10-CM | POA: Insufficient documentation

## 2023-08-07 DIAGNOSIS — R059 Cough, unspecified: Secondary | ICD-10-CM | POA: Diagnosis not present

## 2023-08-07 DIAGNOSIS — I255 Ischemic cardiomyopathy: Secondary | ICD-10-CM | POA: Diagnosis not present

## 2023-08-07 LAB — CBC WITH DIFFERENTIAL (CANCER CENTER ONLY)
Abs Immature Granulocytes: 0 10*3/uL (ref 0.00–0.07)
Basophils Absolute: 0.1 10*3/uL (ref 0.0–0.1)
Basophils Relative: 1 %
Eosinophils Absolute: 0.2 10*3/uL (ref 0.0–0.5)
Eosinophils Relative: 4 %
HCT: 43.9 % (ref 39.0–52.0)
Hemoglobin: 15.2 g/dL (ref 13.0–17.0)
Immature Granulocytes: 0 %
Lymphocytes Relative: 32 %
Lymphs Abs: 1.9 10*3/uL (ref 0.7–4.0)
MCH: 32.8 pg (ref 26.0–34.0)
MCHC: 34.6 g/dL (ref 30.0–36.0)
MCV: 94.8 fL (ref 80.0–100.0)
Monocytes Absolute: 0.6 10*3/uL (ref 0.1–1.0)
Monocytes Relative: 10 %
Neutro Abs: 3.2 10*3/uL (ref 1.7–7.7)
Neutrophils Relative %: 53 %
Platelet Count: 75 10*3/uL — ABNORMAL LOW (ref 150–400)
RBC: 4.63 MIL/uL (ref 4.22–5.81)
RDW: 13.2 % (ref 11.5–15.5)
Smear Review: NORMAL
WBC Count: 6 10*3/uL (ref 4.0–10.5)
nRBC: 0 % (ref 0.0–0.2)

## 2023-08-07 LAB — CMP (CANCER CENTER ONLY)
ALT: 65 U/L — ABNORMAL HIGH (ref 0–44)
AST: 53 U/L — ABNORMAL HIGH (ref 15–41)
Albumin: 4.3 g/dL (ref 3.5–5.0)
Alkaline Phosphatase: 89 U/L (ref 38–126)
Anion gap: 15 (ref 5–15)
BUN: 38 mg/dL — ABNORMAL HIGH (ref 8–23)
CO2: 21 mmol/L — ABNORMAL LOW (ref 22–32)
Calcium: 9.7 mg/dL (ref 8.9–10.3)
Chloride: 101 mmol/L (ref 98–111)
Creatinine: 1.4 mg/dL — ABNORMAL HIGH (ref 0.61–1.24)
GFR, Estimated: 56 mL/min — ABNORMAL LOW (ref >60)
Glucose, Bld: 124 mg/dL — ABNORMAL HIGH (ref 70–99)
Potassium: 4.3 mmol/L (ref 3.5–5.1)
Sodium: 137 mmol/L (ref 135–145)
Total Bilirubin: 0.8 mg/dL (ref 0.0–1.2)
Total Protein: 7.2 g/dL (ref 6.5–8.1)

## 2023-08-07 LAB — IRON AND TIBC
Iron: 93 ug/dL (ref 45–182)
Saturation Ratios: 26 % (ref 17.9–39.5)
TIBC: 356 ug/dL (ref 250–450)
UIBC: 263 ug/dL

## 2023-08-07 LAB — FERRITIN: Ferritin: 450 ng/mL — ABNORMAL HIGH (ref 24–336)

## 2023-08-07 LAB — FOLATE: Folate: 10.9 ng/mL (ref >5.9)

## 2023-08-07 LAB — VITAMIN B12: Vitamin B-12: 457 pg/mL (ref 180–914)

## 2023-08-07 LAB — TSH: TSH: 7.582 u[IU]/mL — ABNORMAL HIGH (ref 0.350–4.500)

## 2023-08-07 NOTE — Telephone Encounter (Signed)
 Patient has been scheduled for follow-up visit per 08/06/23 LOS.  Pt given an appt calendar with date and time.

## 2023-08-24 ENCOUNTER — Other Ambulatory Visit (HOSPITAL_COMMUNITY): Payer: Self-pay

## 2023-08-24 DIAGNOSIS — I5022 Chronic systolic (congestive) heart failure: Secondary | ICD-10-CM

## 2023-08-24 NOTE — Addendum Note (Signed)
 Addended by: Douglass Gelineau R on: 08/24/2023 10:40 AM   Modules accepted: Orders

## 2023-08-25 LAB — LIPID PANEL
Chol/HDL Ratio: 3.2 ratio (ref 0.0–5.0)
Cholesterol, Total: 126 mg/dL (ref 100–199)
HDL: 39 mg/dL — ABNORMAL LOW (ref >39)
LDL Chol Calc (NIH): 56 mg/dL (ref 0–99)
Triglycerides: 187 mg/dL — ABNORMAL HIGH (ref 0–149)
VLDL Cholesterol Cal: 31 mg/dL (ref 5–40)

## 2023-08-26 ENCOUNTER — Ambulatory Visit (HOSPITAL_COMMUNITY): Payer: Self-pay | Admitting: Cardiology

## 2023-08-27 ENCOUNTER — Ambulatory Visit (INDEPENDENT_AMBULATORY_CARE_PROVIDER_SITE_OTHER)
Admission: RE | Admit: 2023-08-27 | Discharge: 2023-08-27 | Disposition: A | Discharge status: Home / Self Care | Source: Ambulatory Visit | Attending: Oncology | Admitting: Oncology

## 2023-08-27 DIAGNOSIS — R161 Splenomegaly, not elsewhere classified: Secondary | ICD-10-CM

## 2023-08-27 DIAGNOSIS — D696 Thrombocytopenia, unspecified: Secondary | ICD-10-CM

## 2023-08-27 DIAGNOSIS — R7401 Elevation of levels of liver transaminase levels: Secondary | ICD-10-CM

## 2023-08-27 MED ORDER — IOHEXOL 300 MG/ML  SOLN
100.0000 mL | Freq: Once | INTRAMUSCULAR | Status: AC | PRN
  Administered 2023-08-27: 100 mL via INTRAVENOUS

## 2023-08-27 NOTE — Progress Notes (Signed)
 [facility]  [address] [place],  [place]  [address] [phone]  Clinic Day:  08/28/2023  Referring physician: [name], MD  HISTORY OF PRESENT ILLNESS:  The patient is a 68 y.o. male who I recently had seen for mild thrombocytopenia.  Due to having elevated liver enzymes, a CT scan was recently done to determine if underlying liver disease may be present and behind his thrombocytopenia.  He comes in today to go over his CT scan images and their implications.  Since his last visit, the patient has been doing fairly well.  Despite having thrombocytopenia, he continues to deny having any subcutaneous bleeding/bruising issues which concern him for there being a significant decline in his platelets.   PHYSICAL EXAM:  Blood pressure 97/60, pulse 62, temperature 98 F (36.7 C), temperature source Oral, resp. rate 16, height 5' 10 (1.778 m), weight 232 lb 12.8 oz (105.6 kg), SpO2 95%. Wt Readings from Last 3 Encounters:  08/28/23 232 lb 12.8 oz (105.6 kg)  08/07/23 230 lb 12.8 oz (104.7 kg)  06/19/23 235 lb 3.2 oz (106.7 kg)   Body mass index is 33.4 kg/m. Performance status (ECOG): 1 - Symptomatic but completely ambulatory Physical Exam Constitutional:      Appearance: Normal appearance. He is not ill-appearing.  HENT:     Mouth/Throat:     Mouth: Mucous membranes are moist.     Pharynx: Oropharynx is clear. No oropharyngeal exudate or posterior oropharyngeal erythema.  Cardiovascular:     Rate and Rhythm: Normal rate and regular rhythm.     Heart sounds: No murmur heard.    No friction rub. No gallop.  Pulmonary:     Effort: Pulmonary effort is normal. No respiratory distress.     Breath sounds: Normal breath sounds. No wheezing, rhonchi or rales.  Abdominal:     General: Bowel sounds are normal. There is no distension.     Palpations: Abdomen is soft. There is no mass.     Tenderness: There is no abdominal tenderness.  Musculoskeletal:         General: No swelling.     Right lower leg: No edema.     Left lower leg: No edema.  Lymphadenopathy:     Cervical: No cervical adenopathy.     Upper Body:     Right upper body: No supraclavicular or axillary adenopathy.     Left upper body: No supraclavicular or axillary adenopathy.     Lower Body: No right inguinal adenopathy. No left inguinal adenopathy.  Skin:    General: Skin is warm.     Coloration: Skin is not jaundiced.     Findings: No lesion or rash.  Neurological:     General: No focal deficit present.     Mental Status: He is alert and oriented to person, place, and time. Mental status is at baseline.  Psychiatric:        Mood and Affect: Mood normal.        Behavior: Behavior normal.        Thought Content: Thought content normal.   SCANS:  His abdominal CT scan revealed the following: FINDINGS:   The lung bases are clear. The heart is normal in size. There are coronary calcifications. Cardiac conduction device partially imaged. The liver is cirrhotic.   There is cholelithiasis. The spleen is mildly enlarged measuring 15.7 cm in coronal plane.   The pancreas is normal. The adrenals are normal. The right kidney is normal.  Left renal cyst is seen. Abdominal aorta is normal in caliber. Scattered atherosclerotic changes are present. The bladder is normal. The prostate is normal. The appendix is normal. Large and small bowel loops are otherwise within normal limits. No free fluid or lymphadenopathy. There is diffuse osseous demineralization with degenerative changes of the spine and bony pelvis.   IMPRESSION:   Cirrhosis with mild splenomegaly.   Other findings as above.  LABS:      Latest Ref Rng & Units 08/07/2023    2:34 PM 06/13/2019   11:00 AM 07/21/2017   10:53 AM  CBC  WBC 4.0 - 10.5 K/uL 6.0  6.4  7.5   Hemoglobin 13.0 - 17.0 g/dL 84.7  84.2  84.7   Hematocrit 39.0 - 52.0 % 43.9  46.4  44.3   Platelets 150 - 400 K/uL 75  173  130        Latest Ref Rng & Units 08/07/2023    2:34 PM 06/19/2023    3:47 PM 03/20/2023   12:23 PM  CMP  Glucose 70 - 99 mg/dL 875  809  740   BUN 8 - 23 mg/dL 38  18  18   Creatinine 0.61 - 1.24 mg/dL 8.59  8.89  8.82   Sodium 135 - 145 mmol/L 137  137  137   Potassium 3.5 - 5.1 mmol/L 4.3  4.3  4.8   Chloride 98 - 111 mmol/L 101  106  104   CO2 22 - 32 mmol/L 21  22  25    Calcium  8.9 - 10.3 mg/dL 9.7  9.0  9.4   Total Protein 6.5 - 8.1 g/dL 7.2     Total Bilirubin 0.0 - 1.2 mg/dL 0.8     Alkaline Phos 38 - 126 U/L 89     AST 15 - 41 U/L 53     ALT 0 - 44 U/L 65       Latest Reference Range & Units 08/07/23 14:34  Iron 45 - 182 ug/dL 93  UIBC ug/dL 736  TIBC 749 - 549 ug/dL 643  Saturation Ratios 17.9 - 39.5 % 26  Ferritin 24 - 336 ng/mL 450 (H)  Folate >5.9 ng/mL 10.9  Vitamin B12 180 - 914 pg/mL 457  (H): Data is abnormally high  Latest Reference Range & Units 08/07/23 14:34  TSH 0.350 - 4.500 uIU/mL 7.582 (H)  (H): Data is abnormally high  ASSESSMENT & PLAN:  A 68 y.o. male who I recently began seeing for thrombocytopenia.  In clinic today, I went over all of his CT scan images with him, for which he could see his baseline cirrhosis, as well as secondary splenomegaly.  Without a doubt, this is the likely etiology behind his mild thrombocytopenia.  Recent labs did not show any type of nutritional deficiencies factoring into his thrombocytopenia.  Although he has an elevated TSH, which is likely consistent with hypothyroidism, I doubt that this is playing a major role into his thrombocytopenia.  I will have him see a liver specialist within these forthcoming months to have his cirrhosis evaluated, as well as to determine the likely etiology behind it.  Otherwise, as his thrombocytopenia is stable, it will continue to be followed conservatively.  I will see this patient back in 6 months for repeat clinical assessment.  The patient understands all the plans discussed today and is in agreement  with them.   Lanier Felty DELENA Kerns, MD

## 2023-08-28 ENCOUNTER — Inpatient Hospital Stay: Attending: Oncology | Admitting: Oncology

## 2023-08-28 ENCOUNTER — Other Ambulatory Visit: Payer: Self-pay | Admitting: Oncology

## 2023-08-28 ENCOUNTER — Telehealth: Payer: Self-pay | Admitting: Oncology

## 2023-08-28 ENCOUNTER — Ambulatory Visit (INDEPENDENT_AMBULATORY_CARE_PROVIDER_SITE_OTHER): Payer: 59

## 2023-08-28 VITALS — BP 97/60 | HR 62 | Temp 98.0°F | Resp 16 | Ht 70.0 in | Wt 232.8 lb

## 2023-08-28 DIAGNOSIS — Z79899 Other long term (current) drug therapy: Secondary | ICD-10-CM | POA: Diagnosis not present

## 2023-08-28 DIAGNOSIS — I255 Ischemic cardiomyopathy: Secondary | ICD-10-CM | POA: Diagnosis not present

## 2023-08-28 DIAGNOSIS — N281 Cyst of kidney, acquired: Secondary | ICD-10-CM | POA: Diagnosis not present

## 2023-08-28 DIAGNOSIS — R946 Abnormal results of thyroid function studies: Secondary | ICD-10-CM | POA: Insufficient documentation

## 2023-08-28 DIAGNOSIS — K7460 Unspecified cirrhosis of liver: Secondary | ICD-10-CM | POA: Insufficient documentation

## 2023-08-28 DIAGNOSIS — R161 Splenomegaly, not elsewhere classified: Secondary | ICD-10-CM | POA: Diagnosis not present

## 2023-08-28 DIAGNOSIS — K8020 Calculus of gallbladder without cholecystitis without obstruction: Secondary | ICD-10-CM | POA: Diagnosis not present

## 2023-08-28 DIAGNOSIS — D696 Thrombocytopenia, unspecified: Secondary | ICD-10-CM

## 2023-08-28 NOTE — Telephone Encounter (Signed)
 Patient has been scheduled for follow-up visit per 08/28/23 LOS.  Pt given an appt calendar with date and time.

## 2023-08-29 LAB — CUP PACEART REMOTE DEVICE CHECK
Battery Remaining Longevity: 44 mo
Battery Voltage: 2.97 V
Brady Statistic RV Percent Paced: 0.02 %
Date Time Interrogation Session: 20250606123324
HighPow Impedance: 91 Ohm
Implantable Lead Connection Status: 753985
Implantable Lead Implant Date: 20190502
Implantable Lead Location: 753860
Implantable Pulse Generator Implant Date: 20190502
Lead Channel Impedance Value: 2109 Ohm
Lead Channel Impedance Value: 2337 Ohm
Lead Channel Pacing Threshold Amplitude: 2.375 V
Lead Channel Pacing Threshold Pulse Width: 0.4 ms
Lead Channel Sensing Intrinsic Amplitude: 14.875 mV
Lead Channel Sensing Intrinsic Amplitude: 14.875 mV
Lead Channel Setting Pacing Amplitude: 4 V
Lead Channel Setting Pacing Pulse Width: 0.7 ms
Lead Channel Setting Sensing Sensitivity: 0.3 mV
Zone Setting Status: 755011
Zone Setting Status: 755011

## 2023-09-01 ENCOUNTER — Ambulatory Visit: Payer: Self-pay | Admitting: Cardiology

## 2023-09-23 ENCOUNTER — Other Ambulatory Visit (HOSPITAL_COMMUNITY): Payer: Self-pay

## 2023-09-23 DIAGNOSIS — I5022 Chronic systolic (congestive) heart failure: Secondary | ICD-10-CM

## 2023-09-23 MED ORDER — CARVEDILOL 25 MG PO TABS: 25.0000 mg | ORAL_TABLET | Freq: Two times a day (BID) | ORAL | 11 refills | Status: AC

## 2023-10-13 NOTE — Addendum Note (Signed)
 Addended by: VICCI SELLER A on: 10/13/2023 11:18 AM   Modules accepted: Orders

## 2023-10-13 NOTE — Progress Notes (Signed)
 Date:  10/16/2023   ID:  Will Rung, DOB [birth year], MRN [id number]   Provider location: [place] [facility] Type of Visit: Established patient  PCP:  [name], MD  HF Cardiologist:  Dr. Rolan   HPI: Ryan Durham is a 68 y.o. male who has a history of CAD s/p CABG x 5 03/2017, systolic CHF 2/2 ICM, Afib in setting of STEMI, and tobacco abuse.    Admitted 1/6 -> 04/09/17 with NSTEMI with sudden onset of chest tightness and SOB. Noted to be in rapid Afib on arrival. He spontaneously converted to NSR with treatment of his chest pain. Underwent LHC that showed critical 3vD. IABP placed with cardiogenic shock and TCTS consulted for CABG. Pt underwent CABG 03/31/2017 with no immediate complications. Pressors weaned and extubated as tolerated. Weaned off IABP POD #1. Pt diuresed with IV lasix  and HF medications adjusted as tolerated.  Discharge weight 217 lbs.    Medtronic ICD placed in 5/19. Echo in 5/20 with EF 20-25%.  Echo in 6/21 with EF 30-35%, global HK, normal RV size and systolic function.  Echo in 3/23 showed EF 30-35%, mildly decreased RV systolic function.  Echo in 1/25 showed EF 30-35%, mild LV dilation, low normal RV function.   Today he returns for HF follow up with his wife. Overall feeling fine. He has SOB walking further distances on flat ground, does OK walking up steps. Denies palpitations, abnormal bleeding, CP, dizziness, edema, or PND/Orthopnea. Appetite ok. Weight at home 223 pounds. Taking all medications. 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   ECG (personally reviewed): none ordered today.  Medtronic device interrogation: 100% VS, no VT, 4.4 hr.day activity, <0.1 hr/day of AF, no VT, stable thoracic impedance.   Labs (3/24): LDL 61, K 3.8, creatinine 9.12 Labs (7/24): K 4.6, creatinine 1.08 Labs (12/24): K 4.8, creatinine 1.17 Labs (5/25): K 4.3, creatinine 1.40, LDL 56   Past Medical History 1. Chronic systolic CHF: ECHO  03/30/2017 EF 25-30%. Ischemic cardiomyopathy, now s/p CABG.  - Echo (4/19): EF 30-35%, wall motion abnormalities, grade 2 diastolic dysfunction, normal RV size and systolic function.   - Medtronic ICD.  - Echo (5/20): EF 20-25%, normal RV size and systolic function.  - Echo (6/21): EF 30-35%, global HK, normal RV size and systolic function. - Echo (3/23): EF 30-35%, mildly decreased RV systolic function - Echo (1/25): EF 30-35%, mild LV dilation, low normal RV function.  2. CAD: Strong FH premature CAD. NSTEMI 1/19, now s/p CABG 03/2017 3. Atrial fibrillation: Paroxysmal. Noted only in setting of NSTEMI.  4. Smoking: Has quit.  5. Carotid stenosis: Carotid dopplers (11/19) with 40-59% BICA stenosis.  - Carotid dopplers (11/20): 40-59% RICA - Carotid dopplers (10/21): 40-59% BICA - Carotid dopplers (10/22): 40-59% BICA - Carotid dopplers (11/23): 40-59% BICA - Carotid dopplers (11/24): 40-49% RICA, 1-39% LICA  Current Outpatient Medications  Medication Sig Dispense Refill   aspirin  EC 81 MG EC tablet Take 1 tablet (81 mg total) by mouth daily.     atorvastatin  (LIPITOR ) 80 MG tablet TAKE 1 TABLET BY MOUTH DAILY 30 tablet 5   carvedilol  (COREG ) 25 MG tablet Take 1 tablet (25 mg total) by mouth 2 (two) times daily with a meal. 60 tablet 11   ezetimibe  (ZETIA ) 10 MG tablet Take 1 tablet (10 mg total) by mouth daily. 90 tablet 3   JARDIANCE  25 MG TABS tablet Take 25 mg by mouth daily.  LOKELMA  10 g PACK packet Take 1 packet by mouth daily.     sacubitril -valsartan  (ENTRESTO ) 97-103 MG Take 1 tablet by mouth 2 (two) times daily. 60 tablet 5   sildenafil  (VIAGRA ) 50 MG tablet Take 1 tablet (50 mg total) by mouth as needed for erectile dysfunction. 15 tablet 5   spironolactone  (ALDACTONE ) 25 MG tablet Take 0.5 tablets (12.5 mg total) by mouth daily. 45 tablet 3   No current facility-administered medications for this encounter.   Allergies:   Patient has no known allergies.   Social History:   The patient  reports that he has quit smoking. His smoking use included cigarettes. He has a 30 pack-year smoking history. He has never used smokeless tobacco. He reports that he does not drink alcohol and does not use drugs.   Family History:  The patient's family history includes Cirrhosis in his mother; Diabetes in his mother and sister; Heart attack in his sister; Myocarditis in his brother; Other in his father.   ROS:  Please see the history of present illness.   All other systems are personally reviewed and negative.   BP 110/62   Pulse (!) 53   Ht 5' 10 (1.778 m)   Wt 105.8 kg (233 lb 3.2 oz)   SpO2 98%   BMI 33.46 kg/m   Wt Readings from Last 3 Encounters:  10/16/23 105.8 kg (233 lb 3.2 oz)  08/28/23 105.6 kg (232 lb 12.8 oz)  08/07/23 104.7 kg (230 lb 12.8 oz)   PHYSICAL EXAM:  General:  NAD. No resp difficulty, walked into clinic HEENT: Normal Neck: Supple. No JVD. Thick neck Cor: Regular rate & rhythm. No rubs, gallops or murmurs. Lungs: Clear Abdomen: Soft, obese, nontender, nondistended.  Extremities: No cyanosis, clubbing, rash, edema Neuro: Alert & oriented x 3, moves all 4 extremities w/o difficulty. Affect pleasant.   ASSESSMENT/PLAN: 1. Chronic systolic CHF: Echo 1/19 EF 25-30%. Ischemic cardiomyopathy, now s/p CABG as below. Repeat echo in 4/19 with EF still low at 30-35% and echo in 5/20 showed EF 20-25%.  Echo in 6/21 showed EF 30-35%.  Echo in 3/23 showed EF 30-35%. Medtronic ICD.  Echo in 1/25 with EF 30-35%.  NYHA class II, not volume overloaded by exam, OptiVol OK but slowly creeping up. - Will give Lasix  20 mg PRN Rx - Continue Coreg  25 mg bid.    - Continue Entresto  97/103 bid. Will request labs drawn at PCP visit yesterday 10/15/23. - Continue spironolactone  12.5 mg daily, will not increase with history of hyperkalemia.  - Continue Lokelma  10 g daily. - Continue empagliflozin  10 mg daily. 2. CAD: Strong FH premature CAD.  NSTEMI, now s/p CABG x 5  03/31/2017.  No chest pain.   - Continue ASA 81 and statin. Check lipids today, goal LDL < 55.  3. Atrial fibrillation: Paroxysmal. Only noted at initial admission in setting of severe CP/NSTEMI.  No palpitations. No recent atrial fibrillation by device interrogation. Regular on exam today. - If recurs, he will need to be anticoagulated.  4. Smoking: Has not smoked since 1/19.  5. Carotid bruit: Moderate BICA stenosis in 11/24 - Repeat carotid dopplers in 11/25.   Follow up in 3 months with Dr. Rolan  Signed, Ryan CHRISTELLA Gainer, FNP  10/16/2023  [facility] [address] [facility] [place] [address] [phone] (office) [phone] (fax)

## 2023-10-13 NOTE — Progress Notes (Signed)
 Remote ICD transmission.

## 2023-10-16 ENCOUNTER — Ambulatory Visit (HOSPITAL_COMMUNITY)
Admission: RE | Admit: 2023-10-16 | Discharge: 2023-10-16 | Disposition: A | Discharge status: Home / Self Care | Source: Ambulatory Visit | Attending: Family Medicine | Admitting: Family Medicine

## 2023-10-16 ENCOUNTER — Encounter (HOSPITAL_COMMUNITY): Payer: Self-pay

## 2023-10-16 VITALS — BP 110/62 | HR 53 | Ht 70.0 in | Wt 233.2 lb

## 2023-10-16 DIAGNOSIS — I5022 Chronic systolic (congestive) heart failure: Secondary | ICD-10-CM | POA: Diagnosis not present

## 2023-10-16 DIAGNOSIS — I480 Paroxysmal atrial fibrillation: Secondary | ICD-10-CM | POA: Insufficient documentation

## 2023-10-16 DIAGNOSIS — I255 Ischemic cardiomyopathy: Secondary | ICD-10-CM | POA: Insufficient documentation

## 2023-10-16 DIAGNOSIS — Z951 Presence of aortocoronary bypass graft: Secondary | ICD-10-CM | POA: Insufficient documentation

## 2023-10-16 DIAGNOSIS — I6523 Occlusion and stenosis of bilateral carotid arteries: Secondary | ICD-10-CM | POA: Insufficient documentation

## 2023-10-16 DIAGNOSIS — Z7984 Long term (current) use of oral hypoglycemic drugs: Secondary | ICD-10-CM | POA: Diagnosis not present

## 2023-10-16 DIAGNOSIS — I2510 Atherosclerotic heart disease of native coronary artery without angina pectoris: Secondary | ICD-10-CM | POA: Diagnosis not present

## 2023-10-16 DIAGNOSIS — Z87891 Personal history of nicotine dependence: Secondary | ICD-10-CM | POA: Insufficient documentation

## 2023-10-16 DIAGNOSIS — I252 Old myocardial infarction: Secondary | ICD-10-CM | POA: Insufficient documentation

## 2023-10-16 DIAGNOSIS — Z95810 Presence of automatic (implantable) cardiac defibrillator: Secondary | ICD-10-CM | POA: Diagnosis not present

## 2023-10-16 MED ORDER — FUROSEMIDE 20 MG PO TABS: 20.0000 mg | ORAL_TABLET | ORAL | 3 refills | Status: AC | PRN

## 2023-10-16 NOTE — Patient Instructions (Signed)
 TAKE lasix  20 mg as needed for weight gain of 3lb in 24 hours or 5 lb in a week  Your physician recommends that you schedule a follow-up appointment in: 3 months ( October) ** PLEASE CALL THE OFFICE IN MID AUGUST TO ARRANGE YOUR FOLLOW UP APPOINTMENT.**  If you have any questions or concerns before your next appointment please send us  a message through [place] or call our office at [phone].    TO LEAVE A MESSAGE FOR THE NURSE SELECT OPTION 2, PLEASE LEAVE A MESSAGE INCLUDING: YOUR NAME DATE OF BIRTH CALL BACK NUMBER REASON FOR CALL**this is important as we prioritize the call backs  YOU WILL RECEIVE A CALL BACK THE SAME DAY AS LONG AS YOU CALL BEFORE 4:00 PM  At the [facility], you and your health needs are our priority. As part of our continuing mission to provide you with exceptional heart care, we have created designated Provider Care Teams. These Care Teams include your primary Cardiologist (physician) and Advanced Practice Providers (APPs- Physician Assistants and Nurse Practitioners) who all work together to provide you with the care you need, when you need it.   You may see any of the following providers on your designated Care Team at your next follow up: Dr Toribio Fuel Dr Ezra Shuck Dr. Ria Commander Dr. Morene Brownie Amy Lenetta, NP Caffie Shed, [place] [facility] [place], [place] Beckey Coe, NP [place] Lee, NP Ellouise Class, NP Tinnie Redman, PharmD Jaun Bash, PharmD   Please be sure to bring in all your medications bottles to every appointment.    Thank you for choosing [facility]

## 2023-10-29 ENCOUNTER — Encounter: Payer: Self-pay | Admitting: Cardiology

## 2023-11-04 ENCOUNTER — Other Ambulatory Visit (HOSPITAL_COMMUNITY): Payer: Self-pay | Admitting: Cardiology

## 2023-11-04 DIAGNOSIS — I5022 Chronic systolic (congestive) heart failure: Secondary | ICD-10-CM

## 2023-11-27 ENCOUNTER — Ambulatory Visit (INDEPENDENT_AMBULATORY_CARE_PROVIDER_SITE_OTHER)

## 2023-11-27 DIAGNOSIS — I255 Ischemic cardiomyopathy: Secondary | ICD-10-CM | POA: Diagnosis not present

## 2023-11-28 LAB — CUP PACEART REMOTE DEVICE CHECK
Battery Remaining Longevity: 39 mo
Battery Voltage: 2.97 V
Brady Statistic RV Percent Paced: 0.03 %
Date Time Interrogation Session: 20250905012305
HighPow Impedance: 83 Ohm
Implantable Lead Connection Status: 753985
Implantable Lead Implant Date: 20190502
Implantable Lead Location: 753860
Implantable Pulse Generator Implant Date: 20190502
Lead Channel Impedance Value: 2261 Ohm
Lead Channel Impedance Value: 2470 Ohm
Lead Channel Pacing Threshold Amplitude: 2.5 V
Lead Channel Pacing Threshold Pulse Width: 0.4 ms
Lead Channel Sensing Intrinsic Amplitude: 18.5 mV
Lead Channel Sensing Intrinsic Amplitude: 18.5 mV
Lead Channel Setting Pacing Amplitude: 4 V
Lead Channel Setting Pacing Pulse Width: 0.7 ms
Lead Channel Setting Sensing Sensitivity: 0.3 mV
Zone Setting Status: 755011
Zone Setting Status: 755011

## 2023-11-29 ENCOUNTER — Ambulatory Visit: Payer: Self-pay | Admitting: Cardiology

## 2023-12-10 NOTE — Progress Notes (Signed)
Remote ICD Transmission.

## 2023-12-18 ENCOUNTER — Other Ambulatory Visit (HOSPITAL_COMMUNITY): Payer: Self-pay | Admitting: Cardiology

## 2023-12-18 DIAGNOSIS — I5022 Chronic systolic (congestive) heart failure: Secondary | ICD-10-CM

## 2023-12-24 DIAGNOSIS — I252 Old myocardial infarction: Secondary | ICD-10-CM | POA: Insufficient documentation

## 2023-12-24 DIAGNOSIS — I509 Heart failure, unspecified: Secondary | ICD-10-CM | POA: Insufficient documentation

## 2023-12-24 DIAGNOSIS — K7469 Other cirrhosis of liver: Secondary | ICD-10-CM | POA: Insufficient documentation

## 2023-12-24 DIAGNOSIS — K766 Portal hypertension: Secondary | ICD-10-CM | POA: Insufficient documentation

## 2023-12-28 ENCOUNTER — Other Ambulatory Visit (HOSPITAL_BASED_OUTPATIENT_CLINIC_OR_DEPARTMENT_OTHER): Payer: Self-pay | Admitting: Nurse Practitioner

## 2023-12-28 DIAGNOSIS — K7469 Other cirrhosis of liver: Secondary | ICD-10-CM

## 2024-01-04 ENCOUNTER — Ambulatory Visit (HOSPITAL_BASED_OUTPATIENT_CLINIC_OR_DEPARTMENT_OTHER)
Admission: RE | Admit: 2024-01-04 | Discharge: 2024-01-04 | Disposition: A | Source: Ambulatory Visit | Attending: Nurse Practitioner | Admitting: Radiology

## 2024-01-04 DIAGNOSIS — K7469 Other cirrhosis of liver: Secondary | ICD-10-CM

## 2024-01-06 DIAGNOSIS — E66811 Obesity, class 1: Secondary | ICD-10-CM | POA: Insufficient documentation

## 2024-02-07 ENCOUNTER — Other Ambulatory Visit (HOSPITAL_COMMUNITY): Payer: Self-pay | Admitting: Cardiology

## 2024-02-14 ENCOUNTER — Other Ambulatory Visit (HOSPITAL_COMMUNITY): Payer: Self-pay | Admitting: Cardiology

## 2024-02-25 NOTE — Progress Notes (Deleted)
 [facility]  [address] [place],  [place]  [address] [phone]  Clinic Day:  08/28/2023  Referring physician: [name], MD  HISTORY OF PRESENT ILLNESS:  The patient is a 68 y.o. male who I recently had seen for mild thrombocytopenia.  Due to having elevated liver enzymes, a CT scan was recently done to determine if underlying liver disease may be present and behind his thrombocytopenia.  He comes in today to go over his CT scan images and their implications.  Since his last visit, the patient has been doing fairly well.  Despite having thrombocytopenia, he continues to deny having any subcutaneous bleeding/bruising issues which concern him for there being a significant decline in his platelets.   PHYSICAL EXAM:  There were no vitals taken for this visit. Wt Readings from Last 3 Encounters:  10/16/23 233 lb 3.2 oz (105.8 kg)  08/28/23 232 lb 12.8 oz (105.6 kg)  08/07/23 230 lb 12.8 oz (104.7 kg)   There is no height or weight on file to calculate BMI. Performance status (ECOG): 1 - Symptomatic but completely ambulatory Physical Exam Constitutional:      Appearance: Normal appearance. He is not ill-appearing.  HENT:     Mouth/Throat:     Mouth: Mucous membranes are moist.     Pharynx: Oropharynx is clear. No oropharyngeal exudate or posterior oropharyngeal erythema.  Cardiovascular:     Rate and Rhythm: Normal rate and regular rhythm.     Heart sounds: No murmur heard.    No friction rub. No gallop.  Pulmonary:     Effort: Pulmonary effort is normal. No respiratory distress.     Breath sounds: Normal breath sounds. No wheezing, rhonchi or rales.  Abdominal:     General: Bowel sounds are normal. There is no distension.     Palpations: Abdomen is soft. There is no mass.     Tenderness: There is no abdominal tenderness.  Musculoskeletal:        General: No swelling.     Right lower leg: No edema.     Left lower leg: No edema.   Lymphadenopathy:     Cervical: No cervical adenopathy.     Upper Body:     Right upper body: No supraclavicular or axillary adenopathy.     Left upper body: No supraclavicular or axillary adenopathy.     Lower Body: No right inguinal adenopathy. No left inguinal adenopathy.  Skin:    General: Skin is warm.     Coloration: Skin is not jaundiced.     Findings: No lesion or rash.  Neurological:     General: No focal deficit present.     Mental Status: He is alert and oriented to person, place, and time. Mental status is at baseline.  Psychiatric:        Mood and Affect: Mood normal.        Behavior: Behavior normal.        Thought Content: Thought content normal.   SCANS:  His abdominal CT scan revealed the following: FINDINGS:   The lung bases are clear. The heart is normal in size. There are coronary calcifications. Cardiac conduction device partially imaged. The liver is cirrhotic.   There is cholelithiasis. The spleen is mildly enlarged measuring 15.7 cm in coronal plane.   The pancreas is normal. The adrenals are normal. The right kidney is normal. Left renal cyst is seen. Abdominal aorta is normal in caliber. Scattered atherosclerotic changes are present. The  bladder is normal. The prostate is normal. The appendix is normal. Large and small bowel loops are otherwise within normal limits. No free fluid or lymphadenopathy. There is diffuse osseous demineralization with degenerative changes of the spine and bony pelvis.   IMPRESSION:   Cirrhosis with mild splenomegaly.   Other findings as above.  LABS:      Latest Ref Rng & Units 08/07/2023    2:34 PM 06/13/2019   11:00 AM 07/21/2017   10:53 AM  CBC  WBC 4.0 - 10.5 K/uL 6.0  6.4  7.5   Hemoglobin 13.0 - 17.0 g/dL 84.7  84.2  84.7   Hematocrit 39.0 - 52.0 % 43.9  46.4  44.3   Platelets 150 - 400 K/uL 75  173  130       Latest Ref Rng & Units 08/07/2023    2:34 PM 06/19/2023    3:47 PM 03/20/2023   12:23 PM  CMP   Glucose 70 - 99 mg/dL 875  809  740   BUN 8 - 23 mg/dL 38  18  18   Creatinine 0.61 - 1.24 mg/dL 8.59  8.89  8.82   Sodium 135 - 145 mmol/L 137  137  137   Potassium 3.5 - 5.1 mmol/L 4.3  4.3  4.8   Chloride 98 - 111 mmol/L 101  106  104   CO2 22 - 32 mmol/L 21  22  25    Calcium  8.9 - 10.3 mg/dL 9.7  9.0  9.4   Total Protein 6.5 - 8.1 g/dL 7.2     Total Bilirubin 0.0 - 1.2 mg/dL 0.8     Alkaline Phos 38 - 126 U/L 89     AST 15 - 41 U/L 53     ALT 0 - 44 U/L 65       Latest Reference Range & Units 08/07/23 14:34  Iron 45 - 182 ug/dL 93  UIBC ug/dL 736  TIBC 749 - 549 ug/dL 643  Saturation Ratios 17.9 - 39.5 % 26  Ferritin 24 - 336 ng/mL 450 (H)  Folate >5.9 ng/mL 10.9  Vitamin B12 180 - 914 pg/mL 457  (H): Data is abnormally high  Latest Reference Range & Units 08/07/23 14:34  TSH 0.350 - 4.500 uIU/mL 7.582 (H)  (H): Data is abnormally high  ASSESSMENT & PLAN:  A 68 y.o. male who I recently began seeing for thrombocytopenia.  In clinic today, I went over all of his CT scan images with him, for which he could see his baseline cirrhosis, as well as secondary splenomegaly.  Without a doubt, this is the likely etiology behind his mild thrombocytopenia.  Recent labs did not show any type of nutritional deficiencies factoring into his thrombocytopenia.  Although he has an elevated TSH, which is likely consistent with hypothyroidism, I doubt that this is playing a major role into his thrombocytopenia.  I will have him see a liver specialist within these forthcoming months to have his cirrhosis evaluated, as well as to determine the likely etiology behind it.  Otherwise, as his thrombocytopenia is stable, it will continue to be followed conservatively.  I will see this patient back in 6 months for repeat clinical assessment.  The patient understands all the plans discussed today and is in agreement with them.   Blondie Riggsbee DELENA Kerns, MD

## 2024-02-26 ENCOUNTER — Inpatient Hospital Stay: Admitting: Oncology

## 2024-02-26 ENCOUNTER — Encounter

## 2024-02-28 NOTE — Progress Notes (Unsigned)
 [facility]  [address] [place],  [place]  [address] [phone]  Clinic Day:  08/28/2023  Referring physician: [name], MD  HISTORY OF PRESENT ILLNESS:  The patient is a 68 y.o. male who I recently had seen for mild thrombocytopenia.  Due to having elevated liver enzymes, a CT scan was recently done to determine if underlying liver disease may be present and behind his thrombocytopenia.  He comes in today to go over his CT scan images and their implications.  Since his last visit, the patient has been doing fairly well.  Despite having thrombocytopenia, he continues to deny having any subcutaneous bleeding/bruising issues which concern him for there being a significant decline in his platelets.   PHYSICAL EXAM:  There were no vitals taken for this visit. Wt Readings from Last 3 Encounters:  10/16/23 233 lb 3.2 oz (105.8 kg)  08/28/23 232 lb 12.8 oz (105.6 kg)  08/07/23 230 lb 12.8 oz (104.7 kg)   There is no height or weight on file to calculate BMI. Performance status (ECOG): 1 - Symptomatic but completely ambulatory Physical Exam Constitutional:      Appearance: Normal appearance. He is not ill-appearing.  HENT:     Mouth/Throat:     Mouth: Mucous membranes are moist.     Pharynx: Oropharynx is clear. No oropharyngeal exudate or posterior oropharyngeal erythema.  Cardiovascular:     Rate and Rhythm: Normal rate and regular rhythm.     Heart sounds: No murmur heard.    No friction rub. No gallop.  Pulmonary:     Effort: Pulmonary effort is normal. No respiratory distress.     Breath sounds: Normal breath sounds. No wheezing, rhonchi or rales.  Abdominal:     General: Bowel sounds are normal. There is no distension.     Palpations: Abdomen is soft. There is no mass.     Tenderness: There is no abdominal tenderness.  Musculoskeletal:        General: No swelling.     Right lower leg: No edema.     Left lower leg: No edema.   Lymphadenopathy:     Cervical: No cervical adenopathy.     Upper Body:     Right upper body: No supraclavicular or axillary adenopathy.     Left upper body: No supraclavicular or axillary adenopathy.     Lower Body: No right inguinal adenopathy. No left inguinal adenopathy.  Skin:    General: Skin is warm.     Coloration: Skin is not jaundiced.     Findings: No lesion or rash.  Neurological:     General: No focal deficit present.     Mental Status: He is alert and oriented to person, place, and time. Mental status is at baseline.  Psychiatric:        Mood and Affect: Mood normal.        Behavior: Behavior normal.        Thought Content: Thought content normal.   SCANS:  His abdominal CT scan revealed the following: FINDINGS:   The lung bases are clear. The heart is normal in size. There are coronary calcifications. Cardiac conduction device partially imaged. The liver is cirrhotic.   There is cholelithiasis. The spleen is mildly enlarged measuring 15.7 cm in coronal plane.   The pancreas is normal. The adrenals are normal. The right kidney is normal. Left renal cyst is seen. Abdominal aorta is normal in caliber. Scattered atherosclerotic changes are present. The  bladder is normal. The prostate is normal. The appendix is normal. Large and small bowel loops are otherwise within normal limits. No free fluid or lymphadenopathy. There is diffuse osseous demineralization with degenerative changes of the spine and bony pelvis.   IMPRESSION:   Cirrhosis with mild splenomegaly.   Other findings as above.  LABS:      Latest Ref Rng & Units 08/07/2023    2:34 PM 06/13/2019   11:00 AM 07/21/2017   10:53 AM  CBC  WBC 4.0 - 10.5 K/uL 6.0  6.4  7.5   Hemoglobin 13.0 - 17.0 g/dL 84.7  84.2  84.7   Hematocrit 39.0 - 52.0 % 43.9  46.4  44.3   Platelets 150 - 400 K/uL 75  173  130       Latest Ref Rng & Units 08/07/2023    2:34 PM 06/19/2023    3:47 PM 03/20/2023   12:23 PM  CMP   Glucose 70 - 99 mg/dL 875  809  740   BUN 8 - 23 mg/dL 38  18  18   Creatinine 0.61 - 1.24 mg/dL 8.59  8.89  8.82   Sodium 135 - 145 mmol/L 137  137  137   Potassium 3.5 - 5.1 mmol/L 4.3  4.3  4.8   Chloride 98 - 111 mmol/L 101  106  104   CO2 22 - 32 mmol/L 21  22  25    Calcium  8.9 - 10.3 mg/dL 9.7  9.0  9.4   Total Protein 6.5 - 8.1 g/dL 7.2     Total Bilirubin 0.0 - 1.2 mg/dL 0.8     Alkaline Phos 38 - 126 U/L 89     AST 15 - 41 U/L 53     ALT 0 - 44 U/L 65       Latest Reference Range & Units 08/07/23 14:34  Iron 45 - 182 ug/dL 93  UIBC ug/dL 736  TIBC 749 - 549 ug/dL 643  Saturation Ratios 17.9 - 39.5 % 26  Ferritin 24 - 336 ng/mL 450 (H)  Folate >5.9 ng/mL 10.9  Vitamin B12 180 - 914 pg/mL 457  (H): Data is abnormally high  Latest Reference Range & Units 08/07/23 14:34  TSH 0.350 - 4.500 uIU/mL 7.582 (H)  (H): Data is abnormally high  ASSESSMENT & PLAN:  A 68 y.o. male who I recently began seeing for thrombocytopenia.  In clinic today, I went over all of his CT scan images with him, for which he could see his baseline cirrhosis, as well as secondary splenomegaly.  Without a doubt, this is the likely etiology behind his mild thrombocytopenia.  Recent labs did not show any type of nutritional deficiencies factoring into his thrombocytopenia.  Although he has an elevated TSH, which is likely consistent with hypothyroidism, I doubt that this is playing a major role into his thrombocytopenia.  I will have him see a liver specialist within these forthcoming months to have his cirrhosis evaluated, as well as to determine the likely etiology behind it.  Otherwise, as his thrombocytopenia is stable, it will continue to be followed conservatively.  I will see this patient back in 6 months for repeat clinical assessment.  The patient understands all the plans discussed today and is in agreement with them.   Avanelle Pixley DELENA Kerns, MD

## 2024-02-29 ENCOUNTER — Other Ambulatory Visit: Payer: Self-pay | Admitting: Oncology

## 2024-02-29 ENCOUNTER — Telehealth: Payer: Self-pay | Admitting: Oncology

## 2024-02-29 ENCOUNTER — Inpatient Hospital Stay

## 2024-02-29 ENCOUNTER — Inpatient Hospital Stay: Admitting: Oncology

## 2024-02-29 DIAGNOSIS — D696 Thrombocytopenia, unspecified: Secondary | ICD-10-CM

## 2024-02-29 DIAGNOSIS — K7460 Unspecified cirrhosis of liver: Secondary | ICD-10-CM | POA: Diagnosis not present

## 2024-02-29 DIAGNOSIS — D6959 Other secondary thrombocytopenia: Secondary | ICD-10-CM | POA: Diagnosis present

## 2024-02-29 DIAGNOSIS — R161 Splenomegaly, not elsewhere classified: Secondary | ICD-10-CM | POA: Diagnosis not present

## 2024-02-29 DIAGNOSIS — Z79899 Other long term (current) drug therapy: Secondary | ICD-10-CM | POA: Diagnosis not present

## 2024-02-29 LAB — CMP (CANCER CENTER ONLY)
ALT: 68 U/L — ABNORMAL HIGH (ref 0–44)
AST: 60 U/L — ABNORMAL HIGH (ref 15–41)
Albumin: 4.2 g/dL (ref 3.5–5.0)
Alkaline Phosphatase: 83 U/L (ref 38–126)
Anion gap: 10 (ref 5–15)
BUN: 17 mg/dL (ref 8–23)
CO2: 24 mmol/L (ref 22–32)
Calcium: 9.3 mg/dL (ref 8.9–10.3)
Chloride: 106 mmol/L (ref 98–111)
Creatinine: 0.85 mg/dL (ref 0.61–1.24)
GFR, Estimated: >60 mL/min (ref >60)
Glucose, Bld: 116 mg/dL — ABNORMAL HIGH (ref 70–99)
Potassium: 4 mmol/L (ref 3.5–5.1)
Sodium: 140 mmol/L (ref 135–145)
Total Bilirubin: 0.8 mg/dL (ref 0.0–1.2)
Total Protein: 6.6 g/dL (ref 6.5–8.1)

## 2024-02-29 LAB — CBC WITH DIFFERENTIAL (CANCER CENTER ONLY)
Abs Immature Granulocytes: 0.01 K/uL (ref 0.00–0.07)
Basophils Absolute: 0 K/uL (ref 0.0–0.1)
Basophils Relative: 1 %
Eosinophils Absolute: 0.2 K/uL (ref 0.0–0.5)
Eosinophils Relative: 3 %
HCT: 44.1 % (ref 39.0–52.0)
Hemoglobin: 14.9 g/dL (ref 13.0–17.0)
Immature Granulocytes: 0 %
Lymphocytes Relative: 30 %
Lymphs Abs: 1.4 K/uL (ref 0.7–4.0)
MCH: 33 pg (ref 26.0–34.0)
MCHC: 33.8 g/dL (ref 30.0–36.0)
MCV: 97.6 fL (ref 80.0–100.0)
Monocytes Absolute: 0.4 K/uL (ref 0.1–1.0)
Monocytes Relative: 8 %
Neutro Abs: 2.6 K/uL (ref 1.7–7.7)
Neutrophils Relative %: 58 %
Platelet Count: 59 K/uL — ABNORMAL LOW (ref 150–400)
RBC: 4.52 MIL/uL (ref 4.22–5.81)
RDW: 13.2 % (ref 11.5–15.5)
WBC Count: 4.5 K/uL (ref 4.0–10.5)
nRBC: 0 % (ref 0.0–0.2)

## 2024-02-29 LAB — TSH: TSH: 8.89 u[IU]/mL — ABNORMAL HIGH (ref 0.350–4.500)

## 2024-02-29 NOTE — Telephone Encounter (Signed)
 Patient has been scheduled for follow-up visit per 02/29/2024 LOS.  Pt given an appt calendar with date and time.

## 2024-03-08 ENCOUNTER — Encounter: Payer: Self-pay | Admitting: Physician Assistant

## 2024-03-16 ENCOUNTER — Other Ambulatory Visit (HOSPITAL_COMMUNITY): Payer: Self-pay | Admitting: Cardiology

## 2024-03-28 DIAGNOSIS — I5022 Chronic systolic (congestive) heart failure: Secondary | ICD-10-CM

## 2024-03-28 MED ORDER — SACUBITRIL-VALSARTAN 97-103 MG PO TABS: 1.0000 | ORAL_TABLET | Freq: Two times a day (BID) | ORAL | 6 refills | Status: AC

## 2024-04-15 ENCOUNTER — Encounter (HOSPITAL_COMMUNITY): Payer: Self-pay | Admitting: Cardiology

## 2024-04-15 ENCOUNTER — Other Ambulatory Visit (HOSPITAL_COMMUNITY): Payer: Self-pay

## 2024-04-15 ENCOUNTER — Ambulatory Visit (HOSPITAL_COMMUNITY)
Admission: RE | Admit: 2024-04-15 | Discharge: 2024-04-15 | Disposition: A | Source: Ambulatory Visit | Attending: Cardiology | Admitting: Cardiology

## 2024-04-15 ENCOUNTER — Other Ambulatory Visit (HOSPITAL_COMMUNITY): Payer: Self-pay | Admitting: Cardiology

## 2024-04-15 ENCOUNTER — Ambulatory Visit: Admitting: Physician Assistant

## 2024-04-15 VITALS — BP 102/60 | HR 61 | Wt 224.4 lb

## 2024-04-15 DIAGNOSIS — Z4502 Encounter for adjustment and management of automatic implantable cardiac defibrillator: Secondary | ICD-10-CM | POA: Diagnosis not present

## 2024-04-15 DIAGNOSIS — I255 Ischemic cardiomyopathy: Secondary | ICD-10-CM | POA: Insufficient documentation

## 2024-04-15 DIAGNOSIS — R001 Bradycardia, unspecified: Secondary | ICD-10-CM | POA: Insufficient documentation

## 2024-04-15 DIAGNOSIS — Z8249 Family history of ischemic heart disease and other diseases of the circulatory system: Secondary | ICD-10-CM | POA: Diagnosis not present

## 2024-04-15 DIAGNOSIS — Z7982 Long term (current) use of aspirin: Secondary | ICD-10-CM | POA: Diagnosis not present

## 2024-04-15 DIAGNOSIS — Z87891 Personal history of nicotine dependence: Secondary | ICD-10-CM | POA: Diagnosis not present

## 2024-04-15 DIAGNOSIS — K7460 Unspecified cirrhosis of liver: Secondary | ICD-10-CM | POA: Diagnosis not present

## 2024-04-15 DIAGNOSIS — I2510 Atherosclerotic heart disease of native coronary artery without angina pectoris: Secondary | ICD-10-CM | POA: Diagnosis not present

## 2024-04-15 DIAGNOSIS — I5022 Chronic systolic (congestive) heart failure: Secondary | ICD-10-CM | POA: Insufficient documentation

## 2024-04-15 DIAGNOSIS — I6523 Occlusion and stenosis of bilateral carotid arteries: Secondary | ICD-10-CM | POA: Insufficient documentation

## 2024-04-15 DIAGNOSIS — Z8639 Personal history of other endocrine, nutritional and metabolic disease: Secondary | ICD-10-CM | POA: Diagnosis not present

## 2024-04-15 DIAGNOSIS — I252 Old myocardial infarction: Secondary | ICD-10-CM | POA: Diagnosis not present

## 2024-04-15 DIAGNOSIS — D6959 Other secondary thrombocytopenia: Secondary | ICD-10-CM | POA: Insufficient documentation

## 2024-04-15 DIAGNOSIS — R161 Splenomegaly, not elsewhere classified: Secondary | ICD-10-CM | POA: Diagnosis not present

## 2024-04-15 DIAGNOSIS — Z951 Presence of aortocoronary bypass graft: Secondary | ICD-10-CM | POA: Insufficient documentation

## 2024-04-15 DIAGNOSIS — I444 Left anterior fascicular block: Secondary | ICD-10-CM | POA: Diagnosis not present

## 2024-04-15 DIAGNOSIS — Z79899 Other long term (current) drug therapy: Secondary | ICD-10-CM | POA: Diagnosis not present

## 2024-04-15 LAB — BASIC METABOLIC PANEL WITH GFR
Anion gap: 12 (ref 5–15)
BUN: 22 mg/dL (ref 8–23)
CO2: 22 mmol/L (ref 22–32)
Calcium: 9.2 mg/dL (ref 8.9–10.3)
Chloride: 102 mmol/L (ref 98–111)
Creatinine, Ser: 1.05 mg/dL (ref 0.61–1.24)
GFR, Estimated: >60 mL/min
Glucose, Bld: 114 mg/dL — ABNORMAL HIGH (ref 70–99)
Potassium: 4 mmol/L (ref 3.5–5.1)
Sodium: 136 mmol/L (ref 135–145)

## 2024-04-15 LAB — LIPID PANEL
Cholesterol: 95 mg/dL (ref 0–200)
HDL: 40 mg/dL — ABNORMAL LOW
LDL Cholesterol: 38 mg/dL (ref 0–99)
Total CHOL/HDL Ratio: 2.4 ratio
Triglycerides: 85 mg/dL
VLDL: 17 mg/dL (ref 0–40)

## 2024-04-15 LAB — PRO BRAIN NATRIURETIC PEPTIDE: Pro Brain Natriuretic Peptide: 446 pg/mL — ABNORMAL HIGH

## 2024-04-15 NOTE — Patient Instructions (Addendum)
 Good to  see you today!  Labs done today, your results will be available in MyChart, we will contact you for abnormal readings.  Your physician has requested that you have an echocardiogram. Echocardiography is a painless test that uses sound waves to create images of your heart. It provides your doctor with information about the size and shape of your heart and how well your hearts chambers and valves are working. This procedure takes approximately one hour. There are no restrictions for this procedure. Please do NOT wear cologne, perfume, aftershave, or lotions (deodorant is allowed). Please arrive 15 minutes prior to your appointment time.  Please note: We ask at that you not bring children with you during ultrasound (echo/ vascular) testing. Due to room size and safety concerns, children are not allowed in the ultrasound rooms during exams. Our front office staff cannot provide observation of children in our lobby area while testing is being conducted. An adult accompanying a patient to their appointment will only be allowed in the ultrasound room at the discretion of the ultrasound technician under special circumstances. We apologize for any inconvenience.  Your physician has requested that you have a carotid duplex. This test is an ultrasound of the carotid arteries in your neck. It looks at blood flow through these arteries that supply the brain with blood. Allow one hour for this exam. There are no restrictions or special instructions.  Your physician recommends that you schedule a follow-up appointment  As scheduled  If you have any questions or concerns before your next appointment please send us  a message through [facility] or call our office at [phone].    TO LEAVE A MESSAGE FOR THE NURSE SELECT OPTION 2, PLEASE LEAVE A MESSAGE INCLUDING: YOUR NAME DATE OF BIRTH CALL BACK NUMBER REASON FOR CALL**this is important as we prioritize the call backs  YOU WILL RECEIVE A CALL BACK THE  SAME DAY AS LONG AS YOU CALL BEFORE 4:00 PM At the [facility], you and your health needs are our priority. As part of our continuing mission to provide you with exceptional heart care, we have created designated Provider Care Teams. These Care Teams include your primary Cardiologist (physician) and Advanced Practice Providers (APPs- Physician Assistants and Nurse Practitioners) who all work together to provide you with the care you need, when you need it.   You may see any of the following providers on your designated Care Team at your next follow up: Dr Toribio Fuel Dr Ezra Shuck Dr. Morene Brownie Greig Mosses, NP Caffie Shed, [place] [facility], [place] Beckey Coe, NP Jordan Lee, NP Ellouise Class, NP Tinnie Redman, PharmD Jaun Bash, PharmD   Please be sure to bring in all your medications bottles to every appointment.    Thank you for choosing [facility]

## 2024-04-16 NOTE — Progress Notes (Signed)
 "    Date:  04/16/2024   ID:  Ryan Durham, DOB June 08, 1955, MRN [id number]   Provider location: [place] Type of Visit: Established patient  PCP:  [name], MD  HF Cardiologist:  Dr. Rolan  Chief complaint: CHF   HPI: Ryan Durham is a 69 y.o. male who has a history of CAD s/p CABG x 5 03/2017, systolic CHF 2/2 ICM, Afib in setting of STEMI, and tobacco abuse.    Admitted 1/6 -> 04/09/17 with NSTEMI with sudden onset of chest tightness and SOB. Noted to be in rapid Afib on arrival. He spontaneously converted to NSR with treatment of his chest pain. Underwent LHC that showed critical 3vD. IABP placed with cardiogenic shock and TCTS consulted for CABG. Pt underwent CABG 03/31/2017 with no immediate complications. Pressors weaned and extubated as tolerated. Weaned off IABP POD #1. Pt diuresed with IV lasix  and HF medications adjusted as tolerated.  Discharge weight 217 lbs.    Medtronic ICD placed in 5/19. Echo in 5/20 with EF 20-25%.  Echo in 6/21 with EF 30-35%, global HK, normal RV size and systolic function.  Echo in 3/23 showed EF 30-35%, mildly decreased RV systolic function.  Echo in 1/25 showed EF 30-35%, mild LV dilation, low normal RV function.   He has been found by imaging to have cirrhosis and splenomegaly.  He is seeing a liver specialist, suspect NASH.   Patient returns for followup of CHF.  He has been doing well symptomatically.  No exertional dyspnea or chest pain.  No lightheadedness.  No palpitations.  No orthopnea/PND.  Weight down 9 lbs.  He continues to work full time for [employer]. He has not used Lasix .   ECG (personally reviewed): NSR, LAFB, old ASMI  Medtronic device interrogation: No AF/VT, stable thoracic impedance.   Labs (3/24): LDL 61, K 3.8, creatinine 9.12 Labs (7/24): K 4.6, creatinine 1.08 Labs (12/24): K 4.8, creatinine 1.17 Labs (5/25): K 4.3, creatinine 1.40, LDL 56 Labs (12/25): K 4, creatinine 0.85, AST 60, ALT 68,  hgb 14.9, plts 59, anti-smooth muscle Ab+, ANA+.    Past Medical History 1. Chronic systolic CHF: ECHO 03/30/2017 EF 25-30%. Ischemic cardiomyopathy, now s/p CABG.  - Echo (4/19): EF 30-35%, wall motion abnormalities, grade 2 diastolic dysfunction, normal RV size and systolic function.   - Medtronic ICD.  - Echo (5/20): EF 20-25%, normal RV size and systolic function.  - Echo (6/21): EF 30-35%, global HK, normal RV size and systolic function. - Echo (3/23): EF 30-35%, mildly decreased RV systolic function - Echo (1/25): EF 30-35%, mild LV dilation, low normal RV function.  2. CAD: Strong FH premature CAD. NSTEMI 1/19, now s/p CABG 03/2017 3. Atrial fibrillation: Paroxysmal. Noted only in setting of NSTEMI.  4. Smoking: Has quit.  5. Carotid stenosis: Carotid dopplers (11/19) with 40-59% BICA stenosis.  - Carotid dopplers (11/20): 40-59% RICA - Carotid dopplers (10/21): 40-59% BICA - Carotid dopplers (10/22): 40-59% BICA - Carotid dopplers (11/23): 40-59% BICA - Carotid dopplers (11/24): 40-49% RICA, 1-39% LICA 6. Cirrhosis: With splenomegaly.  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  - CT abdomen 6/25 with cirrhosis + splenomegaly.  7. Thrombocytopenia: Thought to be due to splenomegaly.   Current Outpatient Medications  Medication Sig Dispense Refill   aspirin  EC 81 MG EC tablet Take 1 tablet (81 mg total) by mouth daily.     atorvastatin  (LIPITOR ) 80 MG tablet TAKE 1 TABLET BY MOUTH DAILY 30 tablet 5   carvedilol  (COREG ) 25  MG tablet Take 1 tablet (25 mg total) by mouth 2 (two) times daily with a meal. 60 tablet 11   ezetimibe  (ZETIA ) 10 MG tablet Take 1 tablet (10 mg total) by mouth daily. 90 tablet 3   furosemide  (LASIX ) 20 MG tablet Take 1 tablet (20 mg total) by mouth as needed for fluid or edema. For weight gain of 3 lb in 24 hours or 5 lb ina week. 90 tablet 3   JARDIANCE  25 MG TABS tablet Take 25 mg by mouth daily.     LOKELMA  10 g PACK packet MIX AND DRINK 1 PACKET(10 GRAMS) BY MOUTH DAILY 30 each 0    sacubitril -valsartan  (ENTRESTO ) 97-103 MG Take 1 tablet by mouth 2 (two) times daily. 60 tablet 6   sildenafil  (VIAGRA ) 50 MG tablet Take 1 tablet (50 mg total) by mouth as needed for erectile dysfunction. 15 tablet 5   spironolactone  (ALDACTONE ) 25 MG tablet TAKE 1/2 TABLET(12.5 MG) BY MOUTH DAILY 45 tablet 3   No current facility-administered medications for this encounter.   Allergies:   Patient has no known allergies.   Social History:  The patient  reports that he has quit smoking. His smoking use included cigarettes. He has a 30 pack-year smoking history. He has never used smokeless tobacco. He reports that he does not drink alcohol and does not use drugs.   Family History:  The patient's family history includes Cirrhosis in his mother; Diabetes in his mother and sister; Heart attack in his sister; Myocarditis in his brother; Other in his father.   ROS:  Please see the history of present illness.   All other systems are personally reviewed and negative.   BP 102/60   Pulse 61   Wt 101.8 kg (224 lb 6.4 oz)   SpO2 95%   BMI 32.20 kg/m   Wt Readings from Last 3 Encounters:  04/15/24 101.8 kg (224 lb 6.4 oz)  02/29/24 103.8 kg (228 lb 14.4 oz)  10/16/23 105.8 kg (233 lb 3.2 oz)   PHYSICAL EXAM:  General: NAD Neck: No JVD, no thyromegaly or thyroid  nodule.  Lungs: Clear to auscultation bilaterally with normal respiratory effort. CV: Nondisplaced PMI.  Heart regular S1/S2, no S3/S4, no murmur.  No peripheral edema.  No carotid bruit.  Normal pedal pulses.  Abdomen: Soft, nontender, no hepatosplenomegaly, no distention.  Skin: Intact without lesions or rashes.  Neurologic: Alert and oriented x 3.  Psych: Normal affect. Extremities: No clubbing or cyanosis.  HEENT: Normal.    ASSESSMENT/PLAN: 1. Chronic systolic CHF: Echo 1/19 EF 25-30%. Ischemic cardiomyopathy, now s/p CABG as below. Repeat echo in 4/19 with EF still low at 30-35% and echo in 5/20 showed EF 20-25%.  Echo in 6/21  showed EF 30-35%.  Echo in 3/23 showed EF 30-35%. Medtronic ICD.  Echo in 1/25 with EF 30-35%.  NYHA class I-II, not volume overloaded by exam or Optivol. - He has not needed Lasix .  - Continue Coreg  25 mg bid.    - Continue Entresto  97/103 bid. BMET/BNP today.  - Continue spironolactone  12.5 mg daily, Ryan not increase with history of hyperkalemia.  - Continue Lokelma  10 g daily. - Continue empagliflozin  10 mg daily. - I Ryan arrange for echo 2. CAD: Strong FH premature CAD.  NSTEMI, now s/p CABG x 5 03/31/2017.  No chest pain.   - Continue ASA 81 and statin. Check lipids today, goal LDL < 55.  3. Atrial fibrillation: Paroxysmal. Only noted at initial admission in setting  of severe CP/NSTEMI.  No palpitations. No recent atrial fibrillation by device interrogation. NSR today.  - If recurs, he Ryan need to be anticoagulated.  4. Smoking: Has not smoked since 1/19.  5. Carotid bruit: Moderate BICA stenosis in 11/24 - Repeat carotid dopplers 6. Thrombocytopenia: Chronic, due to splenomegaly in setting of cirrhosis. 7. Cirrhosis: Noted by imaging.  Seeing liver specialist.  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  Followup with liver specialist.   Follow up in 3 months with APP  I spent 32 minutes reviewing records, interviewing/examining patient, and managing orders.   Signed, Ezra Shuck, MD  04/16/2024  [facility] [address] [facility] [place] [address] [phone] (office) [phone] (fax) "

## 2024-04-17 ENCOUNTER — Ambulatory Visit (HOSPITAL_COMMUNITY): Payer: Self-pay | Admitting: Cardiology

## 2024-04-25 ENCOUNTER — Ambulatory Visit: Admitting: Medical

## 2024-04-25 ENCOUNTER — Encounter: Payer: Self-pay | Admitting: Cardiology

## 2024-04-25 VITALS — BP 96/56 | HR 62 | Ht 70.0 in | Wt 225.0 lb

## 2024-04-25 DIAGNOSIS — I255 Ischemic cardiomyopathy: Secondary | ICD-10-CM

## 2024-04-25 DIAGNOSIS — I5022 Chronic systolic (congestive) heart failure: Secondary | ICD-10-CM

## 2024-04-25 LAB — CUP PACEART INCLINIC DEVICE CHECK
Date Time Interrogation Session: 20260202154303
Implantable Lead Connection Status: 753985
Implantable Lead Implant Date: 20190502
Implantable Lead Location: 753860
Implantable Pulse Generator Implant Date: 20190502

## 2024-04-25 NOTE — Progress Notes (Signed)
 " Electrophysiology Office Note:   Date:  04/25/2024  ID:  Ryan Durham, DOB 10-Jul-1955, MRN [id number]  Primary Cardiologist: None Primary Heart Failure: Ezra Shuck, MD Electrophysiologist: Will Gladis Norton, MD      History of Present Illness:   Ryan Durham is a 69 y.o. male with h/o ICD with known chronic RV lead impedance issues and elevated threshold -  seen today for routine electrophysiology followup.    Discussed the use of AI scribe software for clinical note transcription with the patient, who gave verbal consent to proceed.  History of Present Illness   Ryan Durham is a 69 year old male with coronary artery disease, congestive heart failure, ischemic cardiomyopathy, and atrial fibrillation who presents for follow-up of his cardiac conditions.  Cardiac history and interventions - Coronary artery disease with five-vessel coronary artery bypass graft in 2019 - Congestive heart failure and ischemic cardiomyopathy - Atrial fibrillation following ST-elevation myocardial infarction - Single chamber implantable cardioverter-defibrillator placed May 2019 - Most recent echocardiogram (January 2025) with ejection fraction 30-35%  Dyspnea and exercise tolerance - Occasional shortness of breath, particularly with exertion - No chest tightness, angina, or syncope - No orthopnea - No paroxysmal nocturnal dyspnea  Nocturia and fluid status - Wakes up once in the morning to urinate - No nocturnal urination unless increased fluid intake when off work  Device function and arrhythmia monitoring - Right ventricular lead with elevated pacing impedance, stable - Moderately elevated pacing thresholds at 3 volts at 0.7 milliseconds - Pacing burden 0% - Cardiac compass review shows episodes of atrial fibrillation, maximum duration 19 minutes (April previous year)  Current medical therapy and lifestyle - Aspirin , carvedilol  25 mg twice daily, Entresto , spironolactone  (half  tablet) - Lasix  used as needed - Low sodium and sugar-free diet, avoids salty foods  Hematologic and hepatic evaluation - Upcoming procedure involving endoscopic evaluation for low platelet count and possible liver pathology - Uncertain of technical details, understands it involves liver assessment     Awaiting upcoming procedure to assess his Portal Hypertension by Hepatology.  Review of systems complete and found to be negative unless listed in HPI.   EP Information / Studies Reviewed:    EKG is not ordered today. EKG from 04-15-24 reviewed which showed nsr with left axis deviation and biatrial enlargement      Patient Name: Ryan Durham  DOB: 1955/06/10 Weight:  Last Weight  Most recent update: 04/25/2024  3:38 PM    Weight  102.1 kg (225 lb)             Today's Date: 04/25/24   STOP BANG RISK ASSESSMENT  S(snore) Have you been told that your snore?  T(tired) Are you often tired, fatigued, or sleepy during the day? O(obstruction) Do you stop breathing, choke or gasp during sleep? P(pressure) Do you have or are you being treated for high blood pressure? B(BMI) Is your body index greater than 35 kg/m? Body mass index is 32.28 kg/m.  A(age) Are you 91 years old or older? 70 y.o. N(neck) Do you have a neck circumference greater than 40 cm? G(gender) Are you a male? male  Clinical Notes: Will consult Sleep Specialist and refer for management of therapy due to patient increased risk of Sleep Apnea. Ordering a sleep study due to the following two clinical symptoms: Excessive daytime sleepiness G47.10 / Gastroesphageal reflux K21.9 / Nocturia R35.1 / Morning Headaches G44.21 / Difficulty concentrating R41.840 / Memory problems or poor judgement G31.84 / Personality changes or  irritability R45.4 / Loud snoring R06.83 / Depression F32.9 / Unrefreshed by sleep G47.8 / Impotence N52.9 / History of high blood pressure R03.0 / Insomnia G47.00    Physical Exam:   VS:  BP (!) 96/56    Pulse 62   Ht 5' 10 (1.778 m)   Wt 225 lb (102.1 kg)   SpO2 96%   BMI 32.28 kg/m    Wt Readings from Last 3 Encounters:  04/25/24 225 lb (102.1 kg)  04/15/24 224 lb 6.4 oz (101.8 kg)  02/29/24 228 lb 14.4 oz (103.8 kg)     General:  well appearing well developed - NAD HEENT: Normocephalic, atraumatic. EOMI. Sclera anicteric.   Neck: Supple.   Heart: Regular rate and rhythm Normal S1, S2. I/VI murmur,  No significant S3/S4, NO rub.   Lungs: cta bilaterally. No wheezes, rales or rhonchi.   Abdomen: Benign (Soft, non-tender, non-distended) Extremities: no le edema, No clubbing, cyanosis Skin: Warm, Dry, Pink  No rashes or petechiae noted in exposed areas.   Neurologic: A&Ox4,  No focal or lateralizing deficits.   Psychologic: Normal affect. Mood is appropriate.  ASSESSMENT AND PLAN:   Assessment and Plan    Cardiomyopathy, ischemic Ischemic cardiomyopathy with reduced ejection fraction (EF) of 30-35% as per echocardiogram in January 2025. No ventricular dysfunction or arrhythmias. Occasional exertional dyspnea without chest tightness or angina. No orthopnea or paroxysmal nocturnal dyspnea. No significant sleep apnea symptoms, but borderline for sleep apnea due to liver issues and fluid retention. - Continue carvedilol  25 mg twice daily. - Continue Entresto  as prescribed. - Continue spironolactone  half tablet as prescribed. - Advised low sodium diet. - Discussed potential sleep apnea testing with primary care provider.  Atrial fibrillation Episodes lasting up to 19 minutes as per cardiac compass review. No recent shocks from ICD. No syncope or significant arrhythmias reported. - Continue current management and monitoring.  Coronary artery disease, post-CABG Coronary artery disease with CABG to five vessels in 2019. No recent angina or chest pain. No recent ICD shocks. - Continue aspirin  therapy. - Continue current cardiac medications.  Implanted cardiac defibrillator,  single chamber Single chamber ICD implanted in May 2019. No recent shocks. RV lead with elevated pacing impedance and moderately elevated pacing thresholds. Paces 0% of the time. No ventricular dysfunction or arrhythmias. - Continue annual follow-up with electrophysiologist.        Follow up with Dr. Inocencio in 12 months  Signed, Morene Phoenix, PA  "

## 2024-04-28 NOTE — Progress Notes (Unsigned)
 "     Ryan Console, [name] [address] [place], [place]  [address] Phone: [phone]   Gastroenterology Consultation  Referring Provider:     [name], [name] Primary Care Physician:  [name], MD Primary Gastroenterologist:  Ryan Console, [name] / Dr. Gordy Starch  Reason for Consultation:     MASLD cirrhosis; schedule EGD to screen for varices        HPI:   Discussed the use of AI scribe software for clinical note transcription with the patient, who gave verbal consent to proceed.  New patient.  Here to establish care for liver cirrhosis.  He was referred from Morrison Hays, NP, hepatologist to schedule EGD to screen for esophageal varices.  He is here today with his wife.  Patient saw [facility], Morrison Hays, NP 12/2023 to evaluate cirrhosis.  Incidental nodular contour to the liver noted on CT scan.  Thrombocytopenia and splenomegaly.  Hepatitis B and C labs negative.  No history of variceal bleeding.  No previous EGD.  Is on carvedilol  for hypertension.  Furosemide  20 mg and spironolactone  12.5 mg daily managed by cardiology.  No history of encephalopathy.  No regular alcohol use.  No previous liver biopsy.  Patient had extensive liver lab work and ultrasound done 12/2023 through hepatology.  Cirrhosis determined due to MASLD.  Patient has 82-month follow-up with hepatology in April.  Previous colonoscopy: 07/2023 by Dr. Larene in [place].  Patient reports no polyps.  10-year repeat.  No previous EGD.  Patient denies any GI symptoms.  PMH: CAD, ischemic cardiomyopathy, CHF, NSTEMI, atrial fibrillation, portal hypertension, CABG x 5 (in 2019), tobacco use disorder, type 2 diabetes, cirrhosis.  Cardiologist Dr. Rolan and Dr. Inocencio.  History of ICD (placed 2019).  03/2023 echo LVEF 30 to 35%.  He takes 81 mg aspirin  daily.  No other blood thinners.  History of Present Illness Ryan Durham is a 69 year old male with cirrhosis and cardiac disease who presents  for evaluation and scheduling of an upper endoscopy for esophageal varices screening.  Cirrhosis and Esophageal Varices Screening: - Cirrhosis with no prior upper endoscopy performed - Scheduled for upper endoscopy for esophageal varices screening - Colonoscopy performed in May or June 2025 was normal; repeat recommended in ten years - No gastrointestinal symptoms including abdominal pain, swelling, hematochezia, melena, nausea, or vomiting  Thrombocytopenia and Hematologic Findings: - Thrombocytopenia with previously reported platelet count of 54 - Uncertain if platelets were checked in most recent labs from January 2026 - No recent low hemoglobin - No symptoms suggestive of gastrointestinal bleeding - Takes baby aspirin ; no use of other blood thinners  Cardiac Disease and Heart Failure: - History of myocardial infarction and coronary artery bypass grafting - Internal cardiac defibrillator placed in 2019 - Last echocardiogram showed ejection fraction of 30-35%, stable since 2019 - Occasional shortness of breath.  He denies chest pain. - No use of supplemental oxygen     Past Medical History:  Diagnosis Date   Acute on chronic systolic heart failure (HCC) 03/29/2017   Anginal pain    this admission   Cardiomyopathy, ischemic 03/30/2017   CHF (congestive heart failure) (HCC)    Coronary artery disease    this admission   Coronary artery disease involving native coronary artery of native heart with unstable angina pectoris (HCC) 03/29/2017   Dyspnea    this admission   Dysrhythmia    afib with this admission/runs of VTACH/Afib both with this admission   Hyperlipidemia  Myocardial infarction [facility])    this admission   Paroxysmal atrial fibrillation (HCC) 03/29/2017   Tobacco abuse    Type II diabetes mellitus (HCC) 03/30/2017    Past Surgical History:  Procedure Laterality Date   CLIPPING OF ATRIAL APPENDAGE Left 03/31/2017   Procedure: CLIPPING OF ATRIAL APPENDAGE;  Surgeon: Dusty Sudie DEL, MD;  [facility]: [facility] OR;  Service: Open Heart Surgery;  Laterality: Left;   CORONARY ANGIOPLASTY  03/30/2017   CORONARY ARTERY BYPASS GRAFT N/A 03/31/2017   Procedure: CORONARY ARTERY BYPASS GRAFTING (CABG) time 5 using bilateral saphaneous vien, harvested endoscopicly and right internal mammary artery.;  Surgeon: Dusty Sudie DEL, MD;  Location: [facility] OR;  Service: Open Heart Surgery;  Laterality: N/A;   CORONARY/GRAFT ACUTE MI REVASCULARIZATION N/A 03/29/2017   Procedure: Coronary/Graft Acute MI Revascularization;  Surgeon: Verlin Lonni BIRCH, MD;  Location: [facility] INVASIVE CV LAB;  Service: Cardiovascular;  Laterality: N/A;   IABP INSERTION N/A 03/29/2017   Procedure: IABP Insertion;  Surgeon: Verlin Lonni BIRCH, MD;  Location: [facility] INVASIVE CV LAB;  Service: Cardiovascular;  Laterality: N/A;   ICD IMPLANT N/A 07/23/2017   Procedure: ICD IMPLANT;  Surgeon: Inocencio Soyla Lunger, MD;  Location: [facility] INVASIVE CV LAB;  Service: Cardiovascular;  Laterality: N/A;   LEFT HEART CATH AND CORONARY ANGIOGRAPHY N/A 03/29/2017   Procedure: LEFT HEART CATH AND CORONARY ANGIOGRAPHY;  Surgeon: Verlin Lonni BIRCH, MD;  Location: [facility] INVASIVE CV LAB;  Service: Cardiovascular;  Laterality: N/A;   TEE WITHOUT CARDIOVERSION N/A 03/31/2017   Procedure: TRANSESOPHAGEAL ECHOCARDIOGRAM (TEE);  Surgeon: Dusty Sudie DEL, MD;  Location: [facility] OR;  Service: Open Heart Surgery;  Laterality: N/A;    Prior to Admission medications  Medication Sig Start Date End Date Taking? Authorizing Provider  ACCU-CHEK GUIDE TEST test strip daily. 01/14/24   [provider]  Accu-Chek Softclix Lancets lancets SMARTSIG:Lancet(s) Topical Daily 01/14/24   [provider]  aspirin  EC 81 MG EC tablet Take 1 tablet (81 mg total) by mouth daily. 04/10/17   Lucien Orren SAILOR, [name]  atorvastatin  (LIPITOR ) 80 MG tablet TAKE 1 TABLET BY MOUTH DAILY 12/18/23   McLean, Dalton S, MD  Blood Glucose Monitoring Suppl (ACCU-CHEK GUIDE)  w/Device KIT CHECK YOU SUGAR ON WAKIN UP 01/14/24   [provider]  carvedilol  (COREG ) 25 MG tablet Take 1 tablet (25 mg total) by mouth 2 (two) times daily with a meal. 09/23/23   Rolan Ezra RAMAN, MD  ezetimibe  (ZETIA ) 10 MG tablet Take 1 tablet (10 mg total) by mouth daily. 06/22/23   Rolan Ezra RAMAN, MD  furosemide  (LASIX ) 20 MG tablet Take 1 tablet (20 mg total) by mouth as needed for fluid or edema. For weight gain of 3 lb in 24 hours or 5 lb ina week. 10/16/23 04/15/25  Glena Harlene HERO, FNP  JARDIANCE  25 MG TABS tablet Take 25 mg by mouth daily.    [provider]  LOKELMA  10 g PACK packet MIX AND DRINK 1 PACKET(10 GRAMS) BY MOUTH DAILY 03/16/24   McLean, Dalton S, MD  sacubitril -valsartan  (ENTRESTO ) 97-103 MG Take 1 tablet by mouth 2 (two) times daily. 03/28/24   Rolan Ezra RAMAN, MD  sildenafil  (VIAGRA ) 50 MG tablet Take 1 tablet (50 mg total) by mouth as needed for erectile dysfunction. 04/01/23   Rolan Ezra RAMAN, MD  spironolactone  (ALDACTONE ) 25 MG tablet TAKE 1/2 TABLET(12.5 MG) BY MOUTH DAILY 02/08/24   Rolan Ezra RAMAN, MD    Family History  Problem Relation Age of  Onset   Cirrhosis Mother    Diabetes Mother    Other Father        DIED FROM HEAT STROKE   Heart attack Sister    Diabetes Sister    Myocarditis Brother      Social History[1]  Allergies as of 04/29/2024   (No Known Allergies)    Review of Systems:    All systems reviewed and negative except where noted in HPI.   Physical Exam:  BP 119/70   Pulse 64   Ht 5' 10 (1.778 m)   Wt 225 lb (102.1 kg)   BMI 32.28 kg/m  No LMP for male patient.  General:   Alert,  Well-developed, well-nourished, pleasant and cooperative in NAD Lungs:  Respirations even and unlabored.  Clear throughout to auscultation.   No wheezes, crackles, or rhonchi. No acute distress. Heart:  Regular rate and rhythm; no murmurs, clicks, rubs, or gallops. Abdomen:  Normal bowel sounds.  No bruits.  Soft, and non-distended  without masses, hepatosplenomegaly or hernias noted.  No Tenderness.  No guarding or rebound tenderness.    Neurologic:  Alert and oriented x3;  grossly normal neurologically. Psych:  Alert and cooperative. Normal mood and affect.   Imaging Studies: CUP PACEART INCLINIC DEVICE CHECK Result Date: 04/25/2024 Normal in-clinic single chamber ICD check. Presenting Rhythm: VS 65. Routine testing was performed. Thresholds, sensing, and impedance demonstrate stable parameters and no programming changes needed. No clinically significant arrhythmia. Estimated longevity 2.6 years. Pt enrolled in remote follow-up.Randall Sharps, BSN, RN   Labs: CBC    Component Value Date/Time   WBC 4.5 02/29/2024 1359   WBC 6.4 06/13/2019 1100   RBC 4.52 02/29/2024 1359   HGB 14.9 02/29/2024 1359   HGB 15.2 07/21/2017 1053   HCT 44.1 02/29/2024 1359   HCT 44.3 07/21/2017 1053   PLT 59 (L) 02/29/2024 1359   PLT 130 (L) 07/21/2017 1053   MCV 97.6 02/29/2024 1359   MCV 94 07/21/2017 1053    CMP     Component Value Date/Time   NA 136 04/15/2024 1614   NA 141 06/14/2021 1310   K 4.0 04/15/2024 1614   CL 102 04/15/2024 1614   CO2 22 04/15/2024 1614   GLUCOSE 114 (H) 04/15/2024 1614   BUN 22 04/15/2024 1614   BUN 23 06/14/2021 1310   CREATININE 1.05 04/15/2024 1614   CREATININE 0.85 02/29/2024 1359   CALCIUM  9.2 04/15/2024 1614   PROT 6.6 02/29/2024 1359   ALBUMIN  4.2 02/29/2024 1359   AST 60 (H) 02/29/2024 1359   ALT 68 (H) 02/29/2024 1359   ALKPHOS 83 02/29/2024 1359   BILITOT 0.8 02/29/2024 1359   GFRNONAA >60 04/15/2024 1614   GFRNONAA >60 02/29/2024 1359   GFRAA 74 01/02/2020 1053    Assessment and Plan:   Ryan Durham is a 69 y.o. y/o male has been referred for   1.  MASLD cirrhosis with portal hypertension, thrombocytopenia, splenomegaly - Scheduling EGD to screen for esophageal varices I discussed risks of EGD with patient to include risk of bleeding, perforation, and risk of sedation.   Patient expressed understanding and agrees to proceed with EGD.  - Continue current medications: Carvedilol , furosemide , spironolactone  - Continue follow-up with hepatologist every 6 months - Dawn Drayzek, NP - Recommend CBC to recheck platelet count week before EGD.  2.  Colon cancer screening is up-to-date. - Request colonoscopy report from Dr. Larene in [place] done 07/2023.  3.  Multiple comorbidities:  CAD, ischemic cardiomyopathy,  CHF, NSTEMI, atrial fibrillation, portal hypertension, CABG x 5 (in 2019), tobacco use disorder, type 2 diabetes, cirrhosis.  Cardiologist Dr. Rolan and Dr. Inocencio.  History of ICD (placed 2019).  03/2023 echo LVEF 30 to 35%. - Schedule EGD at hospital due to decreased ejection fraction - He takes 81 mg aspirin  daily.  No other blood thinners.  Assessment and Plan Assessment & Plan Cirrhosis of liver - Scheduled upper endoscopy at [facility] to evaluate for complications. - Rechecked platelet count and blood count prior to the procedure to ensure safety.  Screening for esophageal varices Indicated due to cirrhosis, as esophageal varices are a potential source of gastrointestinal hemorrhage. - Scheduled upper endoscopy at [facility] for variceal screening. - Provided education regarding the procedure, including sedation, preparation, and potential for endoscopic band ligation if varices are identified.  Thrombocytopenia Chronic thrombocytopenia, likely secondary to cirrhosis, with prior platelet count of 54, increasing procedural bleeding risk. - Rechecked platelet count prior to upper endoscopy to assess bleeding risk.  Chronic systolic heart failure Chronic systolic heart failure with reduced ejection fraction (30-35%) and prior coronary artery bypass grafting and ICD placement, necessitating additional procedural precautions. - Scheduled upper endoscopy at [facility] (outpatient) to ensure safety given reduced  ejection fraction.  Rectus diastasis Benign, asymptomatic rectus diastasis not requiring intervention. - Recommended against surgical intervention.  Follow up as needed based on EGD results.  Ryan Console, [name]       [1]  Social History Tobacco Use   Smoking status: Former    Current packs/day: 1.00    Average packs/day: 1 pack/day for 30.0 years (30.0 ttl pk-yrs)    Types: Cigarettes   Smokeless tobacco: Never  Vaping Use   Vaping status: Never Used  Substance Use Topics   Alcohol use: No   Drug use: No   "

## 2024-04-29 ENCOUNTER — Encounter: Payer: Self-pay | Admitting: Physician Assistant

## 2024-04-29 ENCOUNTER — Ambulatory Visit: Admitting: Physician Assistant

## 2024-04-29 VITALS — BP 119/70 | HR 64 | Ht 70.0 in | Wt 225.0 lb

## 2024-04-29 DIAGNOSIS — K7469 Other cirrhosis of liver: Secondary | ICD-10-CM

## 2024-04-29 DIAGNOSIS — I5022 Chronic systolic (congestive) heart failure: Secondary | ICD-10-CM

## 2024-04-29 DIAGNOSIS — D696 Thrombocytopenia, unspecified: Secondary | ICD-10-CM

## 2024-04-29 DIAGNOSIS — K760 Fatty (change of) liver, not elsewhere classified: Secondary | ICD-10-CM

## 2024-04-29 NOTE — Patient Instructions (Addendum)
 Your provider has requested that you go to the basement level for lab work on 05/25/24. Press B on the elevator. The lab is located at the first door on the left as you exit the elevator.   You have been scheduled for an endoscopy. Please follow written instructions given to you at your visit today.  If you use inhalers (even only as needed), please bring them with you on the day of your procedure.  If you take any of the following medications, they will need to be adjusted prior to your procedure:   DO NOT TAKE 7 DAYS PRIOR TO TEST- Trulicity (dulaglutide) Ozempic, Wegovy (semaglutide) Mounjaro, Zepbound (tirzepatide) Bydureon Bcise (exanatide extended release)  DO NOT TAKE 1 DAY PRIOR TO YOUR TEST Rybelsus (semaglutide) Adlyxin (lixisenatide) Victoza (liraglutide) Byetta (exanatide) ________________________________________________________________________  _______________________________________________________  If your blood pressure at your visit was 140/90 or greater, please contact your primary care physician to follow up on this.  _______________________________________________________  If you are age 17 or older, your body mass index should be between 23-30. Your Body mass index is 32.28 kg/m. If this is out of the aforementioned range listed, please consider follow up with your Primary Care Provider.  If you are age 34 or younger, your body mass index should be between 19-25. Your Body mass index is 32.28 kg/m. If this is out of the aformentioned range listed, please consider follow up with your Primary Care Provider.   ________________________________________________________  The [facility] providers would like to encourage you to use MYCHART to communicate with providers for non-urgent requests or questions.  Due to long hold times on the telephone, sending your provider a message by [facility] may be a faster and more efficient way to get a response.  Please allow 48  business hours for a response.  Please remember that this is for non-urgent requests.  _______________________________________________________  [facility] is using a team-based approach to care.  Your team is made up of your doctor and two to three APPS. Our APPS (Nurse Practitioners and Physician Assistants) work with your physician to ensure care continuity for you. They are fully qualified to address your health concerns and develop a treatment plan. They communicate directly with your gastroenterologist to care for you. Seeing the Advanced Practice Practitioners on your physician's team can help you by facilitating care more promptly, often allowing for earlier appointments, access to diagnostic testing, procedures, and other specialty referrals.

## 2024-05-06 ENCOUNTER — Ambulatory Visit (HOSPITAL_COMMUNITY)

## 2024-05-13 ENCOUNTER — Ambulatory Visit (HOSPITAL_COMMUNITY)

## 2024-05-27 ENCOUNTER — Encounter

## 2024-06-01 ENCOUNTER — Ambulatory Visit (HOSPITAL_COMMUNITY): Admit: 2024-06-01 | Admitting: Internal Medicine

## 2024-06-01 ENCOUNTER — Encounter (HOSPITAL_COMMUNITY): Payer: Self-pay

## 2024-08-12 ENCOUNTER — Ambulatory Visit (HOSPITAL_COMMUNITY)

## 2024-08-26 ENCOUNTER — Encounter

## 2024-08-26 ENCOUNTER — Inpatient Hospital Stay: Admitting: Oncology

## 2024-08-26 ENCOUNTER — Inpatient Hospital Stay

## 2024-11-25 ENCOUNTER — Encounter

## 2025-02-24 ENCOUNTER — Encounter
